# Patient Record
Sex: Female | Born: 1950 | Race: Black or African American | Hispanic: No | Marital: Married | Smoking: Light tobacco smoker
Health system: Southern US, Community
[De-identification: ages and names within clinical notes are randomized; demographics above are authoritative.]

## PROBLEM LIST (undated history)

## (undated) DIAGNOSIS — I447 Left bundle-branch block, unspecified: Secondary | ICD-10-CM

## (undated) DIAGNOSIS — I420 Dilated cardiomyopathy: Secondary | ICD-10-CM

## (undated) DIAGNOSIS — Z91199 Patient's noncompliance with other medical treatment and regimen due to unspecified reason: Secondary | ICD-10-CM

## (undated) DIAGNOSIS — R1013 Epigastric pain: Secondary | ICD-10-CM

## (undated) DIAGNOSIS — Z9119 Patient's noncompliance with other medical treatment and regimen: Secondary | ICD-10-CM

## (undated) DIAGNOSIS — I5022 Chronic systolic (congestive) heart failure: Secondary | ICD-10-CM

## (undated) DIAGNOSIS — I428 Other cardiomyopathies: Secondary | ICD-10-CM

## (undated) DIAGNOSIS — K219 Gastro-esophageal reflux disease without esophagitis: Secondary | ICD-10-CM

## (undated) DIAGNOSIS — N183 Chronic kidney disease, stage 3 unspecified: Secondary | ICD-10-CM

## (undated) DIAGNOSIS — R739 Hyperglycemia, unspecified: Secondary | ICD-10-CM

## (undated) DIAGNOSIS — R911 Solitary pulmonary nodule: Secondary | ICD-10-CM

## (undated) DIAGNOSIS — J440 Chronic obstructive pulmonary disease with acute lower respiratory infection: Secondary | ICD-10-CM

## (undated) DIAGNOSIS — E43 Unspecified severe protein-calorie malnutrition: Secondary | ICD-10-CM

## (undated) DIAGNOSIS — N289 Disorder of kidney and ureter, unspecified: Secondary | ICD-10-CM

## (undated) DIAGNOSIS — J209 Acute bronchitis, unspecified: Secondary | ICD-10-CM

## (undated) DIAGNOSIS — R740 Nonspecific elevation of levels of transaminase and lactic acid dehydrogenase [LDH]: Secondary | ICD-10-CM

## (undated) DIAGNOSIS — I340 Nonrheumatic mitral (valve) insufficiency: Secondary | ICD-10-CM

## (undated) DIAGNOSIS — R0603 Acute respiratory distress: Secondary | ICD-10-CM

## (undated) DIAGNOSIS — R0600 Dyspnea, unspecified: Secondary | ICD-10-CM

## (undated) DIAGNOSIS — J9601 Acute respiratory failure with hypoxia: Secondary | ICD-10-CM

## (undated) DIAGNOSIS — I5023 Acute on chronic systolic (congestive) heart failure: Secondary | ICD-10-CM

## (undated) DIAGNOSIS — Z720 Tobacco use: Secondary | ICD-10-CM

## (undated) HISTORY — DX: Dilated cardiomyopathy: I42.0

## (undated) HISTORY — DX: Disorder of kidney and ureter, unspecified: N28.9

## (undated) HISTORY — DX: Hyperglycemia, unspecified: R73.9

## (undated) HISTORY — PX: HERNIA REPAIR: SHX51

## (undated) HISTORY — DX: Acute on chronic systolic (congestive) heart failure: I50.23

## (undated) HISTORY — DX: Nonspecific elevation of levels of transaminase and lactic acid dehydrogenase (ldh): R74.0

## (undated) HISTORY — DX: Acute bronchitis, unspecified: J20.9

## (undated) HISTORY — DX: Nonrheumatic mitral (valve) insufficiency: I34.0

## (undated) HISTORY — PX: CARDIAC CATHETERIZATION: SHX172

## (undated) HISTORY — DX: Chronic obstructive pulmonary disease with (acute) lower respiratory infection: J44.0

## (undated) HISTORY — DX: Epigastric pain: R10.13

## (undated) HISTORY — DX: Acute respiratory failure with hypoxia: J96.01

## (undated) HISTORY — DX: Unspecified severe protein-calorie malnutrition: E43

## (undated) HISTORY — DX: Acute respiratory distress: R06.03

---

## 2004-09-02 ENCOUNTER — Other Ambulatory Visit: Payer: Self-pay

## 2004-09-03 ENCOUNTER — Inpatient Hospital Stay: Payer: Self-pay | Admitting: General Surgery

## 2009-01-24 ENCOUNTER — Emergency Department: Payer: Self-pay | Admitting: Emergency Medicine

## 2010-10-31 ENCOUNTER — Emergency Department: Payer: Self-pay | Admitting: *Deleted

## 2013-09-30 ENCOUNTER — Inpatient Hospital Stay: Payer: Self-pay | Admitting: Internal Medicine

## 2013-09-30 DIAGNOSIS — I059 Rheumatic mitral valve disease, unspecified: Secondary | ICD-10-CM

## 2013-09-30 LAB — COMPREHENSIVE METABOLIC PANEL
ALBUMIN: 3.1 g/dL — AB (ref 3.4–5.0)
ANION GAP: 8 (ref 7–16)
AST: 50 U/L — AB (ref 15–37)
Alkaline Phosphatase: 153 U/L — ABNORMAL HIGH
BILIRUBIN TOTAL: 1 mg/dL (ref 0.2–1.0)
BUN: 17 mg/dL (ref 7–18)
CALCIUM: 9.2 mg/dL (ref 8.5–10.1)
CHLORIDE: 106 mmol/L (ref 98–107)
CO2: 24 mmol/L (ref 21–32)
Creatinine: 1.24 mg/dL (ref 0.60–1.30)
EGFR (African American): 54 — ABNORMAL LOW
EGFR (Non-African Amer.): 47 — ABNORMAL LOW
Glucose: 92 mg/dL (ref 65–99)
OSMOLALITY: 277 (ref 275–301)
Potassium: 4 mmol/L (ref 3.5–5.1)
SGPT (ALT): 53 U/L
Sodium: 138 mmol/L (ref 136–145)
Total Protein: 8 g/dL (ref 6.4–8.2)

## 2013-09-30 LAB — URINALYSIS, COMPLETE
BACTERIA: NONE SEEN
Bilirubin,UR: NEGATIVE
Glucose,UR: NEGATIVE mg/dL (ref 0–75)
Ketone: NEGATIVE
Leukocyte Esterase: NEGATIVE
NITRITE: NEGATIVE
PROTEIN: NEGATIVE
Ph: 5 (ref 4.5–8.0)
Specific Gravity: 1.006 (ref 1.003–1.030)
Squamous Epithelial: <1
WBC UR: 1 /HPF (ref 0–5)

## 2013-09-30 LAB — PRO B NATRIURETIC PEPTIDE: B-Type Natriuretic Peptide: 7302 pg/mL — ABNORMAL HIGH (ref 0–125)

## 2013-09-30 LAB — CBC
HCT: 36.3 % (ref 35.0–47.0)
HGB: 11.5 g/dL — AB (ref 12.0–16.0)
MCH: 28.8 pg (ref 26.0–34.0)
MCHC: 31.8 g/dL — ABNORMAL LOW (ref 32.0–36.0)
MCV: 91 fL (ref 80–100)
Platelet: 448 10*3/uL — ABNORMAL HIGH (ref 150–440)
RBC: 4.01 10*6/uL (ref 3.80–5.20)
RDW: 14.9 % — ABNORMAL HIGH (ref 11.5–14.5)
WBC: 6.7 10*3/uL (ref 3.6–11.0)

## 2013-09-30 LAB — TROPONIN I
Troponin-I: <0.02 ng/mL
Troponin-I: <0.02 ng/mL

## 2013-09-30 LAB — MAGNESIUM: Magnesium: 1.8 mg/dL

## 2013-09-30 LAB — LIPASE, BLOOD: Lipase: 129 U/L (ref 73–393)

## 2013-10-01 DIAGNOSIS — I5023 Acute on chronic systolic (congestive) heart failure: Secondary | ICD-10-CM

## 2013-10-01 DIAGNOSIS — I10 Essential (primary) hypertension: Secondary | ICD-10-CM

## 2013-10-01 LAB — BASIC METABOLIC PANEL
Anion Gap: 7 (ref 7–16)
BUN: 20 mg/dL — ABNORMAL HIGH (ref 7–18)
CALCIUM: 9.1 mg/dL (ref 8.5–10.1)
Chloride: 100 mmol/L (ref 98–107)
Co2: 30 mmol/L (ref 21–32)
Creatinine: 1.12 mg/dL (ref 0.60–1.30)
EGFR (Non-African Amer.): 53 — ABNORMAL LOW
Glucose: 91 mg/dL (ref 65–99)
Osmolality: 276 (ref 275–301)
Potassium: 3.7 mmol/L (ref 3.5–5.1)
SODIUM: 137 mmol/L (ref 136–145)

## 2013-10-02 LAB — BASIC METABOLIC PANEL
Anion Gap: 8 (ref 7–16)
BUN: 19 mg/dL — AB (ref 7–18)
CO2: 27 mmol/L (ref 21–32)
Calcium, Total: 8.8 mg/dL (ref 8.5–10.1)
Chloride: 102 mmol/L (ref 98–107)
Creatinine: 1.08 mg/dL (ref 0.60–1.30)
EGFR (Non-African Amer.): 55 — ABNORMAL LOW
GLUCOSE: 84 mg/dL (ref 65–99)
Osmolality: 275 (ref 275–301)
POTASSIUM: 4 mmol/L (ref 3.5–5.1)
Sodium: 137 mmol/L (ref 136–145)

## 2014-01-13 ENCOUNTER — Emergency Department: Payer: Self-pay | Admitting: Emergency Medicine

## 2014-01-13 LAB — BASIC METABOLIC PANEL
Anion Gap: 10 (ref 7–16)
BUN: 16 mg/dL (ref 7–18)
CALCIUM: 9.1 mg/dL (ref 8.5–10.1)
CHLORIDE: 107 mmol/L (ref 98–107)
CREATININE: 1.18 mg/dL (ref 0.60–1.30)
Co2: 26 mmol/L (ref 21–32)
EGFR (African American): 60 — ABNORMAL LOW
EGFR (Non-African Amer.): 49 — ABNORMAL LOW
Glucose: 94 mg/dL (ref 65–99)
OSMOLALITY: 286 (ref 275–301)
Potassium: 3.9 mmol/L (ref 3.5–5.1)
Sodium: 143 mmol/L (ref 136–145)

## 2014-01-13 LAB — CBC
HCT: 38.3 % (ref 35.0–47.0)
HGB: 12.3 g/dL (ref 12.0–16.0)
MCH: 29.7 pg (ref 26.0–34.0)
MCHC: 32.1 g/dL (ref 32.0–36.0)
MCV: 92 fL (ref 80–100)
PLATELETS: 378 10*3/uL (ref 150–440)
RBC: 4.14 10*6/uL (ref 3.80–5.20)
RDW: 16.8 % — ABNORMAL HIGH (ref 11.5–14.5)
WBC: 7.1 10*3/uL (ref 3.6–11.0)

## 2014-01-13 LAB — MAGNESIUM: Magnesium: 2 mg/dL

## 2014-01-13 LAB — TROPONIN I: Troponin-I: <0.02 ng/mL

## 2014-03-29 ENCOUNTER — Emergency Department: Payer: Self-pay | Admitting: Emergency Medicine

## 2014-03-29 LAB — BASIC METABOLIC PANEL
Anion Gap: 6 — ABNORMAL LOW (ref 7–16)
BUN: 13 mg/dL (ref 7–18)
Calcium, Total: 9.5 mg/dL (ref 8.5–10.1)
Chloride: 105 mmol/L (ref 98–107)
Co2: 28 mmol/L (ref 21–32)
Creatinine: 1.13 mg/dL (ref 0.60–1.30)
EGFR (African American): >60
EGFR (Non-African Amer.): 52 — ABNORMAL LOW
Glucose: 87 mg/dL (ref 65–99)
OSMOLALITY: 277 (ref 275–301)
Potassium: 3.9 mmol/L (ref 3.5–5.1)
Sodium: 139 mmol/L (ref 136–145)

## 2014-03-29 LAB — PRO B NATRIURETIC PEPTIDE: B-Type Natriuretic Peptide: 5106 pg/mL — ABNORMAL HIGH (ref 0–125)

## 2014-03-29 LAB — CBC
HCT: 36.8 % (ref 35.0–47.0)
HGB: 11.9 g/dL — AB (ref 12.0–16.0)
MCH: 28.8 pg (ref 26.0–34.0)
MCHC: 32.2 g/dL (ref 32.0–36.0)
MCV: 90 fL (ref 80–100)
Platelet: 430 10*3/uL (ref 150–440)
RBC: 4.12 10*6/uL (ref 3.80–5.20)
RDW: 16 % — ABNORMAL HIGH (ref 11.5–14.5)
WBC: 8.5 10*3/uL (ref 3.6–11.0)

## 2014-03-29 LAB — TROPONIN I: Troponin-I: <0.02 ng/mL

## 2014-05-16 ENCOUNTER — Emergency Department: Payer: Self-pay | Admitting: Student

## 2014-07-02 NOTE — Discharge Summary (Signed)
PATIENT NAME:  Barbara Oliver, Barbara Oliver MR#:  [id number] DATE OF BIRTH:  [birth year]  DATE OF ADMISSION:  09/30/2013 DATE OF DISCHARGE:  10/03/2013  PRIMARY CARE PHYSICIAN: [facility].   FINAL DIAGNOSES:  1.  Acute respiratory failure, which resolved.  2.  Acute systolic congestive heart failure with severe cardiomyopathy and moderate to severe mitral regurgitation.  3.  Hypertension.  4.  Tobacco abuse.  5.  Hypokalemia.   MEDICATIONS ON DISCHARGE: Include enalapril 5 mg 1 tablet daily, aspirin 81 mg daily, Coreg 3.125 mg twice a day, furosemide 40 mg daily, Klor-Con 10 mEq daily.   DIET: Low-sodium diet, regular consistency.   ACTIVITY: As tolerated.  FOLLOWUP: With Dr. Mariah Milling, cardiology, 1 week; CHF clinic 1-2 weeks with the [facility].   HOSPITAL COURSE: The patient was admitted 09/30/2013, discharged 10/03/2013, came in with shortness of breath and epigastric pain, was admitted with acute respiratory failure and congestive heart failure, was given a stat dose of Lasix and put on 2 L nasal cannula.  LABORATORY AND RADIOLOGICAL DATA DURING THE HOSPITAL COURSE: Included a BNP of 7302. Urinalysis positive for 1+ blood. Troponin negative. Magnesium 1.8. Lipase 129. White blood cell count 6.7, hemoglobin and hematocrit 11.5 and 36.8, platelet count 448,000. Glucose 92, BUN 17, creatinine 1.24, sodium 138, potassium 4.0, chloride 106, CO2 of 24, calcium 9.2, total bilirubin 1.0, alkaline phosphatase 153, ALT 53, AST 50, albumin 3.1. Chest x-ray showed CHF. Abdominal x-ray: Nonobstructive bowel gas pattern. Two troponins negative. Echocardiogram showed an EF of 20% to 25%, severely decreased global left ventricular systolic function, moderate to severe mitral valve regurgitation, moderate to severely elevated pulmonary arterial systolic pressure. Creatinine upon discharge 1.08, potassium 4.0.  HOSPITAL COURSE PER PROBLEM LIST:  1.  For the patient's acute respiratory failure, this had  resolved. The patient was off oxygen, breathing room air comfortably with good saturations upon discharge.  2.  Acute systolic congestive heart failure with severe cardiomyopathy and moderate to severe mitral regurgitation. Patient was diuresed with IV Lasix 40 mg IV b.i.d. during the entire hospital course, switched to Lasix orally 40 mg daily. For severe cardiomyopathy, we did start Coreg 3.125 mg twice a day and enalapril 5 mg daily. The patient will be sent home on 40 mg orally of Lasix, also. Blood pressure upon discharge 102/68. We do like blood pressure on the lower side. As outpatient, may want to cut back Lasix to 20 mg daily as you titrate up the ACE inhibitor. I do recommend a BMP as outpatient.  3.  Hypertension. Patient has relative hypotension here.  4.  Tobacco abuse. Smoking cessation done during the hospital admission.  5.  Hypokalemia. This was replaced during the hospital course. Do recommend checking a BMP in followup appointment.  TIME SPENT ON DISCHARGE: 35 minutes.    ____________________________ Herschell Dimes. Renae Gloss, MD rjw:sk D: 10/03/2013 14:15:54 ET T: 10/04/2013 00:59:57 ET JOB#: [id number]  cc: Herschell Dimes. Renae Gloss, MD, <Dictator> [facility] Antonieta Iba, MD  Salley Scarlet MD ELECTRONICALLY SIGNED 10/05/2013 16:19

## 2014-07-02 NOTE — H&P (Signed)
PATIENT NAME:  Barbara Oliver, Barbara Oliver MR#:  [id number] DATE OF BIRTH:  03-17-1950  DATE OF ADMISSION:  09/30/2013  PRIMARY CARE PHYSICIAN: [facility].   CHIEF COMPLAINT: Shortness of breath and epigastric pain.   HISTORY OF PRESENT ILLNESS: A 64 year old [name] patient with a prior history of repair of incarcerated ventral hernia with chronic on and off epigastric pain, presented to the Emergency Room with a flare-up of her pain along with shortness of breath and orthopnea that she has noticed over the past 3 to 4 days. She also has significant exertional shortness of breath. Does not complain of any chest pain. This epigastric pain that she has is very similar to the flare-ups she has been having for years.   Here in the Emergency Room, the patient has desaturated to 88% to 89% on room air, along with crackles on examination. Chest x-ray showing congestive heart failure, and is being admitted to the hospitalist service. Also her EKG shows left bundle branch block, which is a new finding compared to EKG on file from 2012.   She has  sputum with cough, orthopnea. No lower extremity edema. Has not had any chest pain. No prior cardiac work-up.   PAST MEDICAL HISTORY:  1. Ventral hernia with repair in 2006.  2. Carpal tunnel release.  3. On and off chronic epigastric pain.   SOCIAL HISTORY: The patient smokes 1 or 2 cigarettes a day. No alcohol. No illicit drug use.   CODE STATUS: Full Code.   FAMILY HISTORY: Mother had hypertension. Father died of an MI at a very old age.   HOME MEDICATIONS: Claritin p.r.n.   ALLERGIES: No known drug allergies.   REVIEW OF SYSTEMS:   CONSTITUTIONAL: Complains of fatigue and weakness.  RESPIRATORY: Has cough, clear sputum.  CARDIOVASCULAR: No chest pain, has orthopnea. GASTROINTESTINAL: No nausea, vomiting or diarrhea. Does have abdominal pain.  GENITOURINARY: No dysuria, hematuria, or frequency.  ENDOCRINE: No polyuria, nocturia, or  thyroid problems.  HEMATOLOGIC AND LYMPHATIC: No anemia, easy bruising or bleeding, INTEGUMENTARY: No ulcres, rashMUSCULOSKELETAL: No back pain or arthritis.  NEUROLOGIC: No focal numbness, weakness, or seizure.  PSYCHIATRIC: No anxiety or depression.   PHYSICAL EXAMINATION:  VITAL SIGNS: Temperature 97.8, pulse of 97, respirations 18, blood pressure 131/92, saturating 96% on room air.  GENERAL: Moderately built [name] patient sitting up in bed with conversational dyspnea of 1 line.  GENERAL: Alert and oriented x 3, anxious.  HEENT: Atraumatic, normocephalic. Mucous membranes are moist and pink. External ears normal. No pallor. No icterus. Pupils bilaterally equal and reactive to light.  NECK: Supple. No thyromegaly. No palpable lymph nodes. Trachea midline. No carotid bruit or JVD.  CARDIOVASCULAR: S1, S2, without any murmurs  2+, no edema.  RESPIRATORY: Increased work of breathing with bilateral crackles. Good air entry. No wheezing.  GASTROINTESTINAL: Soft abdomen. Tenderness in the epigastric area. No  rigidity, guarding. Bowel sounds present. No hepatosplenomegaly palpable.  SKIN: Warm and dry. No petechiae or ulcers.  MUSCULOSKELETAL: No joint swelling , redness of large joints. Normal muscle tone.  NEUROLOGICAL: Motor strength 5/5 in upper and lower extremities. Sensation is intact.  LYMPHATIC: No cervical lymphadenopathy.   LABORATORY STUDIES: Show glucose of 92, BNP of 7300, BUN 17, creatinine 1.24, sodium 138, potassium 4, with GFR 47. AST, ALT, alkaline phosphatase, bilirubin normal. WBC 6.7, hemoglobin 11.5, platelets of 448,000.   Urinalysis shows no bacteria.   Chest x-ray shows congestive heart failure.   EKG shows left bundle  branch block, which is new compared to 2012, and this was normal sinus rhythm with no bundle branch block.   ASSESSMENT AND PLAN:  1. Acute respiratory failure secondary to acute congestive heart failure. Unknown if diastolic,  systolic. We will check an, echocardiogram. We will give stat dose of Lasix IV 40 mg. We will also put her on 2 liters nasal cannula to keep saturations over 92%. Monitor input and output, fluid restriction, low-salt diet. We will consult cardiology as with her changes on the EKG, the patient will need an outpatient stress test to follow up. Since troponin is normal and she does not have any chest pain, I suspect this change in EKG has happened in the past. I do not think this is acute.  2. Elevated blood pressure without diagnosis of hypertension, likely has underlying hypertension. We will put her on a low-dose beta blocker at this time.  3. Tobacco abuse. Counseled the patient to quit smoking for greater than 3 minutes. She does understand, but the patient says she smokes very little, but will try to quit.  4. Deep venous thrombosis prophylaxis with Lovenox.  5. CODE STATUS: Full Code.   Management of this case was 45 minutes.   ____________________________ Molinda Bailiff Adreana Coull, MD srs:jr D: 09/30/2013 13:37:11 ET T: 09/30/2013 14:34:50 ET JOB#: [id number]  cc: Wardell Heath R. Maleeha Halls, MD, <Dictator> [facility] Orie Fisherman MD ELECTRONICALLY SIGNED 10/25/2013 14:09

## 2014-07-02 NOTE — Consult Note (Signed)
General Aspect 64 y/o F with h/o incarcerated ventral hernia s/p repair 2006, siatica, and carpal tunnel who presented to [facility] 09/30/2013 with 1 year h/o SOB/DOE that had significantly worsened over the past 3-4 days prior to presenting to care.  ______________________   Present Illness 64 y/o F with the above problem list who presented to [facility] on 09/30/2013 with a 1 year h/o SOB/DOE that had significantly worsened over the past 3-4 days prior to presenting to care. She denies any prior cardiac history.   Over the past 1 year she notes having to stop walking at times to the corner 2/2 to SOB/DOE. This route is apprx 50-60 feet. At times she has noted substernal/epigastric pain during this exertion that is releaved with rest. She has also noted this pain with rest as well. Over the past 3-4 days prior to presenting to care her SOB/DOE significantly worsened, to the point that she could not walk to her bathroom without stopping to take a break. She denies any chest pain or epigastric pain during this time. It was at this time she decided to come in for evaluation. She notes significant orthopnea with her head elevated at about 40 degrees at home. Even with this there are times that she has had difficulty breathing. She notes early satiety when she previously was able to eat quite a bit. She notes mild lower extremity edema. Denies any abdminal swelling. Mild cough with clear sputum.   She goes to [facility] for her routine outpatient care and gets a CPE there yearly.   Upon entering the room she is now on room air and states she is breathing comfortably. Troponins are negative x 3. Echo on 09/30/2013: EF 20-25%, unable to exclude regional WMA, nl RV size and systolic function, mildly increased LV internal cavity size, moderate-severe mitral regurgitation, mild tricuspid regurgitation, moderate-severe increased PASP at 60.1 mm HG (no prior to compare.)   Abdominal film with Moderate cardiomegaly. Vascular  congestion. Diffuse interstitial edema. No pleural effusion.No free ntraperitoneal gas. No disproportionate dilatation of bowel. Probable phleboliths project over the pelvis. Osteopenia.  Home medications: Not currently on medications   Physical Exam:  GEN well developed, well nourished, no acute distress, pleasant   HEENT hearing intact to voice, moist oral mucosa   NECK supple   RESP normal resp effort  crackles   CARD Regular rate and rhythm  S3  Murmur   Murmur Systolic  2/6   Systolic Murmur Out flow    ABD denies tenderness  soft  normal BS   LYMPH negative neck   EXTR negative edema   SKIN normal to palpation   NEURO motor/sensory function intact   PSYCH alert, A+O to time, place, person, good insight   Review of Systems:  Subjective/Chief Complaint SOB, severe cough, worse when supine   General: Weight loss or gain  weight loss   Skin: No Complaints   ENT: No Complaints   Eyes: No Complaints   Neck: No Complaints   Respiratory: Frequent cough  Short of breath  DOE   Cardiovascular: Chest pain or discomfort  Dyspnea  Orthopnea   Gastrointestinal: No Complaints   Vascular: No Complaints   Musculoskeletal: No Complaints   Neurologic: No Complaints   Hematologic: No Complaints   Endocrine: Excessive thirst or urination  nocturia   Psychiatric: No Complaints   Review of Systems: All other systems were reviewed and found to be negative   Medications/Allergies Reviewed Medications/Allergies reviewed   Family &  Social History:  Family and Social History:  Family History Coronary Artery Disease  Hypertension  Smoking  father passed from MI at age 90. mother with htn and passed 2/2 complications from hip fx   Social History positive  tobacco, negative ETOH, negative Illicit drugs, 3 cigs per day for 30 years   + Tobacco Current (within 1 year)  1-2 cigs/day   Place of Living Home  lives in [place]     Hernia:    Carpal Tunnel  Release:    Hernia Repair:          Admit Diagnosis:   CHF: Onset Date: 01-Oct-2013, Status: Active, Description: CHF  Lab Results:  LabObservation:  23-Jul-15 17:54   OBSERVATION Reason for Test  Routine Chem:  24-Jul-15 04:08   Glucose, Serum 91  BUN  20  Creatinine (comp) 1.12  Sodium, Serum 137  Potassium, Serum 3.7  Chloride, Serum 100  CO2, Serum 30  Calcium (Total), Serum 9.1  Anion Gap 7  Osmolality (calc) 276  eGFR (African American) >60  eGFR (Non-African American)  53 (eGFR values <32m/min/1.73 m2 may be an indication of chronic kidney disease (CKD). Calculated eGFR is useful in patients with stable renal function. The eGFR calculation will not be reliable in acutely ill patients when serum creatinine is changing rapidly. It is not useful in  patients on dialysis. The eGFR calculation may not be applicable to patients at the low and high extremes of body sizes, pregnant women, and vegetarians.)  Cardiac:  23-Jul-15 10:21   Troponin I < 0.02 (0.00-0.05 0.05 ng/mL or less: NEGATIVE  Repeat testing in 3-6 hrs  if clinically indicated. >0.05 ng/mL: POTENTIAL  MYOCARDIAL INJURY. Repeat  testing in 3-6 hrs if  clinically indicated. NOTE: An increase or decrease  of 30% or more on serial  testing suggests a  clinically important change)    14:26   Troponin I < 0.02 (0.00-0.05 0.05 ng/mL or less: NEGATIVE  Repeat testing in 3-6 hrs  if clinically indicated. >0.05 ng/mL: POTENTIAL  MYOCARDIAL INJURY. Repeat  testing in 3-6 hrs if  clinically indicated. NOTE: An increase or decrease  of 30% or more on serial  testing suggests a  clinically important change)    19:01   Troponin I < 0.02 (0.00-0.05 0.05 ng/mL or less: NEGATIVE  Repeat testing in 3-6 hrs  if clinically indicated. >0.05 ng/mL: POTENTIAL  MYOCARDIAL INJURY. Repeat  testing in 3-6 hrs if  clinically indicated. NOTE: An increase or decrease  of 30% or more on serial  testing suggests  a  clinically important change)   EKG:  EKG Interp. by me   Interpretation EKG shows NSR, 91, LBBB, LAD   Radiology Results: XRay:    23-Jul-15 10:57, Abdomen 3 Way Includes PA Chest  Abdomen 3 Way Includes PA Chest   REASON FOR EXAM:    chest and upper abdominal pain, reportedly has hx of   abd hernia repair  COMMENTS:       PROCEDURE: DXR - DXR ABDOMEN 3-WAY (INCL PA CXR)  - Sep 30 2013 10:57AM     CLINICAL DATA:  Chest and abdominal pain    EXAM:  ABDOMEN SERIES    COMPARISON:  10/31/2010    FINDINGS:  Moderate cardiomegaly. Vascular congestion. Diffuse interstitial  edema. No pleural effusion.    No free intraperitoneal gas. No disproportionate dilatation of  bowel. Probable phleboliths project over the pelvis. Osteopenia.     IMPRESSION:  CHF.  Nonobstructive bowel gas pattern.      Electronically Signed    By: Maryclare Bean M.D.    On: 09/30/2013 11:11     Verified By: Jamas Lav, M.D.,  Cardiology:    23-Jul-15 17:54, Echo Doppler  Echo Doppler   REASON FOR EXAM:      COMMENTS:       PROCEDURE: [facility] - ECHO DOPPLER COMPLETE(TRANSTHOR)  - Sep 30 2013  5:54PM     RESULT: Echocardiogram Report    Patient Name:   Barbara Oliver Date of Exam: 09/30/2013  Medical Rec #:  [id number]        Custom1:  Date ofBirth:  04/19/1950    Height:       73.0 in  Patient Age:    11 years      Weight:       164.0 lb  Patient Gender: F             BSA:          1.98 m??    Indications: CHF  Sonographer:    Arville Go RDCS  Referring Phys: Hillary Bow, R    Summary:   1. Left ventricular ejection fraction, by visual estimation, is 20 to   25%.   2. Severely decreased global left ventricular systolic function.   3. Unable to exclude regional wall motion abnormality.   4. Normal right ventricular size and systolic function.   5. Mildly increased left ventricular internal cavity size.   6. Moderate to severe mitral valve regurgitation.   7. Mild tricuspid  regurgitation.   8. Moderate to severely elevated pulmonary artery systolic pressure.  2D AND M-MODE MEASUREMENTS (normal ranges within parentheses):  Left Ventricle:          Normal  IVSd (2D):      0.82 cm (0.7-1.1)  LVPWd (2D):     1.12 cm (0.7-1.1) Aorta/LA:                  Normal  LVIDd (2D):     5.35 cm (3.4-5.7) Aortic Root (2D): 3.20 cm (2.4-3.7)  LVIDs (2D):     4.72 cm           Left Atrium (2D): 4.00 cm (1.9-4.0)  LV FS (2D):     11.8 %   (>25%)  LV EF (2D):     25.3 %   (>50%)                                    Right Ventricle:                                    RVd (2D):  LV DIASTOLIC FUNCTION:  MV Peak E: 1.23 m/s  SPECTRAL DOPPLER ANALYSIS (where applicable):  Aortic Valve: AoV Max Vel: 1.18 m/s AoV Peak PG: 5.6 mmHg AoV Mean PG:  LVOT Vmax:  LVOT VTI:  LVOT Diameter: 2.40 cm  Aortic Insufficiency:  AI Half-time:  627 msec  AI Decel Rate: 1.92 m/s??  Tricuspid Valve and PA/RV Systolic Pressure: TR Max Velocity: 3.54 m/s RA   Pressure: 10 mmHg RVSP/PASP: 60.1 mmHg  Pulmonic Valve:  PV Max Velocity: 1.02 m/s PV Max PG: 4.2 mmHg PV Mean PG:    PHYSICIAN INTERPRETATION:  Left Ventricle: The left ventricular internal cavity size was mildly  increased. No left ventricular hypertrophy. Global LV systolic function   was severely decreased. Left ventricular ejection fraction, by visual   estimation, is 20 to 25%.  Right Ventricle: Normal right ventricular size, wall thickness, and   systolic function. The right ventricular size is normal. Global RV   systolic function is normal.  Left Atrium: The left atrium is normal in size.  Right Atrium: The right atrium is normal in size.  Pericardium: There is no evidence of pericardial effusion.  Mitral Valve: The mitral valve is normal in structure. Moderate to severe   mitral valve regurgitation is seen.  Tricuspid Valve: The tricuspid valve is normal. Mild tricuspid   regurgitation is visualized. The tricuspid regurgitant  velocity is 3.54   m/s, and with an assumed right atrial pressure of 10 mmHg, the estimated   right ventricular systolic pressure is moderately elevated at 60.1 mmHg.  Aortic Valve: The aortic valve is normal. Mild aortic valve sclerosis is   present, with no evidence of aortic valve stenosis. Trivial aortic valve   regurgitation is seen.  Pulmonic Valve: The pulmonic valve is normal. Trace pulmonic valve   regurgitation.  Aorta: The aortic root and ascending aorta are structurally normal, with   no evidence of dilitation.  [id number] Ida Rogue MD  Electronically signed by [id number] Ida Rogue MD  Signature Date/Time: 10/01/2013/7:41:26 AM    *** Final ***    IMPRESSION: .        Verified By: Minna Merritts, M.D., MD    No Known Allergies:   Vital Signs/Nurse's Notes:  **Vital Signs.:   24-Jul-15 04:54  Vital Signs Type Admission  Temperature Temperature (F) 98  Celsius 36.6  Temperature Source oral  Pulse Pulse 83  Respirations Respirations 20  Systolic BP Systolic BP 063  Diastolic BP (mmHg) Diastolic BP (mmHg) 86  Mean BP 99  Pulse Ox % Pulse Ox % 99  Pulse Ox Activity Level  At rest  Oxygen Delivery 2L  *Intake and Output.:   Daily 24-Jul-15 07:00  Grand Totals Intake:  240 Output:  1000    Net:  -75 24 Hr.:  -760  Oral Intake      In:  240  Urine ml     Out:  1000  Length of Stay Totals Intake:  240 Output:  1000    Net:  -65    Impression 64 y/o F with h/o incarcerated ventral hernia s/p repair 2006, siatica, and carpal tunnel who presented to [facility] 09/30/2013 with 1 year h/o SOB/DOE that had significantly worsened over the past 3-4 days prior to presenting to care.   1) Acute on chronic systolic CHF:  Her symptoms have present for around 1 year now, with recent exacerbation over the past 3-4 days prior to admission. Echo with reduced EF of 20-25% and increased PASP at 60.1 mm Hg. She has responded well to lasix 40 mg bid, now off oxygen (previously on  2 liters via [place].)  ---Would add enalapril 5 mg daily (goal BP around 100.)  ---Continue coreg 3.125 mg bid at this time.  ---Continue aspirin 81 mg daily.  ---Can consider spiro at outpatient follow up.  She needs nuclear stress test, I do not think she will tolerate ETT given her breathing status. She will likely tolerate this better as an outpatient as she continues to have a severe cough.  Will schedule as an outpt.  She does continue to have crackles on exam and likely requires another day of  hospital admission for agressive diuresis  2) Hypokalemia:  Currently 3.7 Will add k-dur 20 meq daily.   3) Hypertension:  Controlled currently.  Will add enalapril 5 mg daily. Continue coreg 3.125 bid. Goal BP around 100.   4) Tobacco abuse:  Discussed tobacco cessation.   Electronic Signatures: Rise Mu (PA-C)  (Signed 24-Jul-15 10:20)  Authored: General Aspect/Present Illness, History and Physical Exam, Review of System, Family & Social History, Past Medical History, Home Medications, Labs, EKG , Radiology, Allergies, Vital Signs/Nurse's Notes, Impression/Plan Ida Rogue (MD)  (Signed 24-Jul-15 12:56)  Authored: General Aspect/Present Illness, History and Physical Exam, Review of System, Family & Social History, Health Issues, Labs, EKG , Vital Signs/Nurse's Notes, Impression/Plan  Co-Signer: General Aspect/Present Illness, History and Physical Exam, Review of System, Family & Social History, Past Medical History, Home Medications, Labs, EKG , Radiology, Allergies, Vital Signs/Nurse's Notes, Impression/Plan   Last Updated: 24-Jul-15 12:56 by Ida Rogue (MD)

## 2014-09-07 ENCOUNTER — Encounter: Payer: Self-pay | Admitting: *Deleted

## 2014-09-07 ENCOUNTER — Emergency Department
Admission: EM | Admit: 2014-09-07 | Discharge: 2014-09-07 | Disposition: A | Discharge status: Home / Self Care | Payer: Self-pay | Attending: Emergency Medicine | Admitting: Emergency Medicine

## 2014-09-07 DIAGNOSIS — Z720 Tobacco use: Secondary | ICD-10-CM | POA: Insufficient documentation

## 2014-09-07 DIAGNOSIS — I5022 Chronic systolic (congestive) heart failure: Secondary | ICD-10-CM | POA: Insufficient documentation

## 2014-09-07 MED ORDER — FUROSEMIDE 20 MG PO TABS: 20.0000 mg | ORAL_TABLET | Freq: Every day | ORAL | Status: DC

## 2014-09-07 MED ORDER — CARVEDILOL 3.125 MG PO TABS
3.1250 mg | ORAL_TABLET | Freq: Once | ORAL | Status: DC
  Filled 2014-09-07 (×2): qty 1

## 2014-09-07 MED ORDER — FUROSEMIDE 40 MG PO TABS
20.0000 mg | ORAL_TABLET | Freq: Once | ORAL | Status: DC
  Filled 2014-09-07: qty 0.5

## 2014-09-07 MED ORDER — CARVEDILOL 3.125 MG PO TABS: 3.1250 mg | ORAL_TABLET | Freq: Two times a day (BID) | ORAL | Status: DC

## 2014-09-07 NOTE — ED Notes (Signed)
Pt does not have a PCP is here for refill for lasix and Coreg, pt denies any new symptoms

## 2014-09-07 NOTE — ED Provider Notes (Signed)
[facility] Emergency Department Provider Note  ____________________________________________  Time seen: Approximately 11:52 AM  I have reviewed the triage vital signs and the nursing notes.   HISTORY  Chief Complaint Medication Refill    HPI Barbara Oliver is a 64 y.o. female with a hx of heart failure presenting for refills of her medications. She states she has been out of her Coreg and Lasix for one week and is now having dyspnea on exertion, orthopnea, and a productive cough with clear sputum. Compliant with medications, and denies any issues while on them. Checks BP monthly at [facility], states readings WNL (120's-130's/80's). States she has been trying to get in with [facility] for months but keeps getting rejected, still working on getting a PCP. No recent illness, CP, or pedal edema.    Past Medical History  Diagnosis Date  . CHF (congestive heart failure)     There are no active problems to display for this patient.   History reviewed. No pertinent past surgical history.  Current Outpatient Rx  Name  Route  Sig  Dispense  Refill  . carvedilol (COREG) 3.125 MG tablet   Oral   Take 1 tablet (3.125 mg total) by mouth 2 (two) times daily.   60 tablet   2   . furosemide (LASIX) 20 MG tablet   Oral   Take 1 tablet (20 mg total) by mouth daily.   30 tablet   2     Allergies Review of patient's allergies indicates not on file.  No family history on file.  Social History History  Substance Use Topics  . Smoking status: Current Every Day Smoker -- 0.50 packs/day  . Smokeless tobacco: Not on file  . Alcohol Use: No    Review of Systems Constitutional: No fever/chills Eyes: No visual changes. ENT: No sore throat. Cardiovascular: Denies chest pain.  Respiratory: Positive for DOE and 2 pillow orthopnea. Gastrointestinal: No abdominal pain.  No nausea, no vomiting.  No diarrhea.  No constipation. Genitourinary: Negative for  dysuria. Musculoskeletal: Negative for back pain. Denies pedal edema. Skin: Negative for rash. Neurological: Negative for headaches, focal weakness or numbness.  10-point ROS otherwise negative.  ____________________________________________   PHYSICAL EXAM:  VITAL SIGNS: ED Triage Vitals  Enc Vitals Group     BP 09/07/14 1132 136/78 mmHg     Pulse Rate 09/07/14 1132 93     Resp --      Temp 09/07/14 1132 98.2 F (36.8 C)     Temp Source 09/07/14 1132 Oral     SpO2 09/07/14 1132 98 %     Weight 09/07/14 1132 154 lb (69.854 kg)     Height 09/07/14 1132  (1.854 m)     Head Cir --      Peak Flow --      Pain Score --      Pain Loc --      Pain Edu? --      Excl. in GC? --     Constitutional: Alert and oriented. Well appearing and in no acute distress. Eyes: Conjunctivae are normal. PERRL. EOMI. Head: Atraumatic. Nose: No congestion/rhinnorhea. Mouth/Throat: Mucous membranes are moist.   Neck: No stridor.   Cardiovascular: Normal rate, regular rhythm. Grossly normal heart sounds.  Good peripheral circulation. Respiratory: Normal respiratory effort.  No retractions. Lungs CTAB. Musculoskeletal: No lower extremity tenderness nor edema.  No joint effusions. Neurologic:  Normal speech and language. No gross focal neurologic deficits are appreciated. Speech  is normal. No gait instability. Skin:  Skin is warm, dry and intact. No rash noted. Psychiatric: Mood and affect are normal. Speech and behavior are normal.  ____________________________________________   LABS (all labs ordered are listed, but only abnormal results are displayed)  Labs Reviewed - No data to display ____________________________________________  EKG  Deferred ____________________________________________  RADIOLOGY  Deferred ____________________________________________   PROCEDURES  Procedure(s) performed: None  Critical Care performed:  No  ____________________________________________   INITIAL IMPRESSION / ASSESSMENT AND PLAN / ED COURSE  Pertinent labs & imaging results that were available during my care of the patient were reviewed by me and considered in my medical decision making (see chart for details).  Chronic systolic congestive heart failure patient out of medications. Patient here for med refill Rx given for coronary and Lasix. She is to follow-up as needed for any worsening symptomology. Patient voices no other emergency medical complaints at this visit. ____________________________________________   FINAL CLINICAL IMPRESSION(S) / ED DIAGNOSES  Final diagnoses:  Chronic systolic congestive heart failure       Evangeline Dakin, PA-C 09/07/14 1309  Sharman Cheek, MD 09/07/14 1623

## 2014-09-07 NOTE — ED Notes (Signed)
Pt states she is out of her "fluid pill and blood pressure pill", pt states she also has a cough, pt states SOB with activity, pt in no distress upon assessment

## 2015-06-05 ENCOUNTER — Emergency Department
Admission: EM | Admit: 2015-06-05 | Discharge: 2015-06-05 | Disposition: A | Discharge status: Home / Self Care | Payer: Self-pay | Attending: Emergency Medicine | Admitting: Emergency Medicine

## 2015-06-05 DIAGNOSIS — F17210 Nicotine dependence, cigarettes, uncomplicated: Secondary | ICD-10-CM | POA: Insufficient documentation

## 2015-06-05 DIAGNOSIS — I509 Heart failure, unspecified: Secondary | ICD-10-CM | POA: Insufficient documentation

## 2015-06-05 LAB — BASIC METABOLIC PANEL
Anion gap: 5 (ref 5–15)
BUN: 21 mg/dL — AB (ref 6–20)
CALCIUM: 9.2 mg/dL (ref 8.9–10.3)
CHLORIDE: 107 mmol/L (ref 101–111)
CO2: 24 mmol/L (ref 22–32)
CREATININE: 1.22 mg/dL — AB (ref 0.44–1.00)
GFR calc non Af Amer: 46 mL/min — ABNORMAL LOW (ref >60)
GFR, EST AFRICAN AMERICAN: 53 mL/min — AB (ref >60)
GLUCOSE: 98 mg/dL (ref 65–99)
Potassium: 4.3 mmol/L (ref 3.5–5.1)
Sodium: 136 mmol/L (ref 135–145)

## 2015-06-05 LAB — MAGNESIUM: Magnesium: 2.2 mg/dL (ref 1.7–2.4)

## 2015-06-05 MED ORDER — FUROSEMIDE 40 MG PO TABS
20.0000 mg | ORAL_TABLET | Freq: Once | ORAL | Status: AC
  Administered 2015-06-05: 20 mg via ORAL
  Filled 2015-06-05: qty 1

## 2015-06-05 MED ORDER — CARVEDILOL 6.25 MG PO TABS
3.1250 mg | ORAL_TABLET | Freq: Two times a day (BID) | ORAL | Status: DC
  Administered 2015-06-05: 3.125 mg via ORAL
  Filled 2015-06-05: qty 1
  Filled 2015-06-05: qty 0.5

## 2015-06-05 MED ORDER — FUROSEMIDE 20 MG PO TABS: 20.0000 mg | ORAL_TABLET | Freq: Every day | ORAL | Status: DC

## 2015-06-05 MED ORDER — CARVEDILOL 3.125 MG PO TABS: 3.1250 mg | ORAL_TABLET | Freq: Two times a day (BID) | ORAL | Status: DC

## 2015-06-05 NOTE — Discharge Instructions (Signed)

## 2015-06-05 NOTE — ED Provider Notes (Signed)
[facility] Emergency Department Provider Note  ____________________________________________  Time seen: Approximately 4:05 PM  I have reviewed the triage vital signs and the nursing notes.   HISTORY  Chief Complaint Medication Refill    HPI Barbara Oliver is a 65 y.o. female with a past medical history of congestive heart failure presents for evaluation and refill her medication. States that she usually takes score again Lasix with no problems. Is not any lab work recently. States that she does not have a PCP secondary to cost reports that [facility] too [facility] and not enough for [facility]. Denies any medical complaints no shortness of breath no swelling no edema and no chest pains.   Past Medical History  Diagnosis Date  . CHF (congestive heart failure)     There are no active problems to display for this patient.   No past surgical history on file.  Current Outpatient Rx  Name  Route  Sig  Dispense  Refill  . carvedilol (COREG) 3.125 MG tablet   Oral   Take 1 tablet (3.125 mg total) by mouth 2 (two) times daily.   60 tablet   3   . furosemide (LASIX) 20 MG tablet   Oral   Take 1 tablet (20 mg total) by mouth daily.   30 tablet   3     Allergies Review of patient's allergies indicates no known allergies.  No family history on file.  Social History Social History  Substance Use Topics  . Smoking status: Current Every Day Smoker -- 0.50 packs/day  . Smokeless tobacco: Not on file  . Alcohol Use: No    Review of Systems Constitutional: No fever/chills Eyes: No visual changes. ENT: No sore throat. Cardiovascular: Denies chest pain. Respiratory: Denies shortness of breath. Gastrointestinal: No abdominal pain.  No nausea, no vomiting.  No diarrhea.  No constipation. Genitourinary: Negative for dysuria. Musculoskeletal: Negative for back pain. Skin: Negative for rash. Neurological:  Negative for headaches, focal weakness or numbness.  10-point ROS otherwise negative.  ____________________________________________   PHYSICAL EXAM:  VITAL SIGNS: ED Triage Vitals  Enc Vitals Group     BP 06/05/15 1523 132/64 mmHg     Pulse Rate 06/05/15 1523 82     Resp 06/05/15 1523 18     Temp 06/05/15 1523 98.5 F (36.9 C)     Temp Source 06/05/15 1523 Oral     SpO2 06/05/15 1523 100 %     Weight 06/05/15 1523 175 lb (79.379 kg)     Height 06/05/15 1523 6\' 1"  (1.854 m)     Head Cir --      Peak Flow --      Pain Score --      Pain Loc --      Pain Edu? --      Excl. in GC? --     Constitutional: Alert and oriented. Well appearing and in no acute distress. Neck: No stridor.  Negative JVD. Cardiovascular: Normal rate, regular rhythm. Grossly normal heart sounds.  Good peripheral circulation. Respiratory: Normal respiratory effort.  No retractions. Lungs CTAB. Musculoskeletal: No lower extremity tenderness nor edema.  No joint effusions. Neurologic:  Normal speech and language. No gross focal neurologic deficits are appreciated. No gait instability. Skin:  Skin is warm, dry and intact. No rash noted. Psychiatric: Mood and affect are normal. Speech and behavior are normal.  ____________________________________________   LABS (all labs ordered are listed,  but only abnormal results are displayed)  Labs Reviewed  BASIC METABOLIC PANEL - Abnormal; Notable for the following:    BUN 21 (*)    Creatinine, Ser 1.22 (*)    GFR calc non Af Amer 46 (*)    GFR calc Af Amer 53 (*)    All other components within normal limits  MAGNESIUM      PROCEDURES  Procedure(s) performed: None  Critical Care performed: No  ____________________________________________   INITIAL IMPRESSION / ASSESSMENT AND PLAN / ED COURSE  Pertinent labs & imaging results that were available during my care of the patient were reviewed by me and considered in my medical decision making (see  chart for details).  Congestive heart failure by history. Rx refill given for Coreg and Lasix. Patient to try to establish a local provider and follow up as needed. Nubain Baseline BMP and magnesium levels today. ____________________________________________   FINAL CLINICAL IMPRESSION(S) / ED DIAGNOSES  Final diagnoses:  Chronic congestive heart failure, unspecified congestive heart failure type (HCC)     This chart was dictated using voice recognition software/Dragon. Despite best efforts to proofread, errors can occur which can change the meaning. Any change was purely unintentional.   Evangeline Dakin, PA-C 06/05/15 1743  Emily Filbert, MD 06/05/15 [phone]

## 2015-06-05 NOTE — ED Notes (Signed)
Pt states she needs a refill for her b/p medication

## 2015-06-05 NOTE — ED Notes (Signed)
Pt for med refill. No acute complaints.

## 2015-06-11 ENCOUNTER — Emergency Department
Admission: EM | Admit: 2015-06-11 | Discharge: 2015-06-12 | Disposition: A | Discharge status: Home / Self Care | Payer: Self-pay | Attending: Emergency Medicine | Admitting: Emergency Medicine

## 2015-06-11 ENCOUNTER — Emergency Department: Payer: Self-pay

## 2015-06-11 DIAGNOSIS — R0602 Shortness of breath: Secondary | ICD-10-CM | POA: Insufficient documentation

## 2015-06-11 DIAGNOSIS — Z5321 Procedure and treatment not carried out due to patient leaving prior to being seen by health care provider: Secondary | ICD-10-CM | POA: Insufficient documentation

## 2015-06-11 LAB — CBC
HCT: 40.9 % (ref 35.0–47.0)
HEMOGLOBIN: 13.2 g/dL (ref 12.0–16.0)
MCH: 30 pg (ref 26.0–34.0)
MCHC: 32.3 g/dL (ref 32.0–36.0)
MCV: 93 fL (ref 80.0–100.0)
PLATELETS: 305 10*3/uL (ref 150–440)
RBC: 4.39 MIL/uL (ref 3.80–5.20)
RDW: 14.6 % — AB (ref 11.5–14.5)
WBC: 8.9 10*3/uL (ref 3.6–11.0)

## 2015-06-11 LAB — BASIC METABOLIC PANEL
Anion gap: 12 (ref 5–15)
BUN: 14 mg/dL (ref 6–20)
CO2: 21 mmol/L — AB (ref 22–32)
CREATININE: 1.11 mg/dL — AB (ref 0.44–1.00)
Calcium: 9.8 mg/dL (ref 8.9–10.3)
Chloride: 104 mmol/L (ref 101–111)
GFR calc Af Amer: 60 mL/min — ABNORMAL LOW (ref >60)
GFR, EST NON AFRICAN AMERICAN: 52 mL/min — AB (ref >60)
GLUCOSE: 126 mg/dL — AB (ref 65–99)
Potassium: 3.5 mmol/L (ref 3.5–5.1)
Sodium: 137 mmol/L (ref 135–145)

## 2015-06-11 LAB — TROPONIN I

## 2015-06-11 NOTE — ED Notes (Addendum)
Patient reports feeling short of breath "for couple days".  Patient breathing heavily with exertion. Patient reports she has not had blood pressure pill or lasix in about 1 month.

## 2015-06-13 ENCOUNTER — Telehealth: Payer: Self-pay | Admitting: Emergency Medicine

## 2015-06-13 NOTE — ED Notes (Signed)
Called patient due to lwot to inquire about condition and follow up plans. Left message asking her to call me about follow up plans.

## 2015-10-04 ENCOUNTER — Encounter: Payer: Self-pay | Admitting: Emergency Medicine

## 2015-10-04 ENCOUNTER — Emergency Department: Payer: Self-pay

## 2015-10-04 ENCOUNTER — Observation Stay
Admission: EM | Admit: 2015-10-04 | Discharge: 2015-10-06 | Disposition: A | Discharge status: Home / Self Care | Payer: Self-pay | Attending: Internal Medicine | Admitting: Internal Medicine

## 2015-10-04 DIAGNOSIS — Z9119 Patient's noncompliance with other medical treatment and regimen: Secondary | ICD-10-CM | POA: Insufficient documentation

## 2015-10-04 DIAGNOSIS — Z9889 Other specified postprocedural states: Secondary | ICD-10-CM | POA: Insufficient documentation

## 2015-10-04 DIAGNOSIS — M8580 Other specified disorders of bone density and structure, unspecified site: Secondary | ICD-10-CM | POA: Insufficient documentation

## 2015-10-04 DIAGNOSIS — R7401 Elevation of levels of liver transaminase levels: Secondary | ICD-10-CM

## 2015-10-04 DIAGNOSIS — I110 Hypertensive heart disease with heart failure: Principal | ICD-10-CM | POA: Insufficient documentation

## 2015-10-04 DIAGNOSIS — N289 Disorder of kidney and ureter, unspecified: Secondary | ICD-10-CM | POA: Insufficient documentation

## 2015-10-04 DIAGNOSIS — F17210 Nicotine dependence, cigarettes, uncomplicated: Secondary | ICD-10-CM | POA: Insufficient documentation

## 2015-10-04 DIAGNOSIS — I5023 Acute on chronic systolic (congestive) heart failure: Secondary | ICD-10-CM | POA: Insufficient documentation

## 2015-10-04 DIAGNOSIS — K219 Gastro-esophageal reflux disease without esophagitis: Secondary | ICD-10-CM | POA: Insufficient documentation

## 2015-10-04 DIAGNOSIS — I272 Other secondary pulmonary hypertension: Secondary | ICD-10-CM | POA: Insufficient documentation

## 2015-10-04 DIAGNOSIS — I420 Dilated cardiomyopathy: Secondary | ICD-10-CM | POA: Insufficient documentation

## 2015-10-04 DIAGNOSIS — D649 Anemia, unspecified: Secondary | ICD-10-CM | POA: Insufficient documentation

## 2015-10-04 DIAGNOSIS — I509 Heart failure, unspecified: Secondary | ICD-10-CM

## 2015-10-04 DIAGNOSIS — Z8249 Family history of ischemic heart disease and other diseases of the circulatory system: Secondary | ICD-10-CM | POA: Insufficient documentation

## 2015-10-04 DIAGNOSIS — R1011 Right upper quadrant pain: Secondary | ICD-10-CM

## 2015-10-04 DIAGNOSIS — R739 Hyperglycemia, unspecified: Secondary | ICD-10-CM | POA: Insufficient documentation

## 2015-10-04 DIAGNOSIS — J45909 Unspecified asthma, uncomplicated: Secondary | ICD-10-CM | POA: Insufficient documentation

## 2015-10-04 DIAGNOSIS — I340 Nonrheumatic mitral (valve) insufficiency: Secondary | ICD-10-CM | POA: Insufficient documentation

## 2015-10-04 DIAGNOSIS — Z79899 Other long term (current) drug therapy: Secondary | ICD-10-CM | POA: Insufficient documentation

## 2015-10-04 DIAGNOSIS — R1013 Epigastric pain: Secondary | ICD-10-CM

## 2015-10-04 DIAGNOSIS — R740 Nonspecific elevation of levels of transaminase and lactic acid dehydrogenase [LDH]: Secondary | ICD-10-CM | POA: Insufficient documentation

## 2015-10-04 LAB — TROPONIN I: Troponin I: <0.03 ng/mL (ref <0.03)

## 2015-10-04 LAB — BASIC METABOLIC PANEL
Anion gap: 11 (ref 5–15)
BUN: 26 mg/dL — ABNORMAL HIGH (ref 6–20)
CALCIUM: 9.2 mg/dL (ref 8.9–10.3)
CO2: 21 mmol/L — AB (ref 22–32)
CREATININE: 1.04 mg/dL — AB (ref 0.44–1.00)
Chloride: 107 mmol/L (ref 101–111)
GFR calc Af Amer: >60 mL/min (ref >60)
GFR calc non Af Amer: 56 mL/min — ABNORMAL LOW (ref >60)
Glucose, Bld: 102 mg/dL — ABNORMAL HIGH (ref 65–99)
Potassium: 4.5 mmol/L (ref 3.5–5.1)
SODIUM: 139 mmol/L (ref 135–145)

## 2015-10-04 LAB — CBC
HCT: 35.2 % (ref 35.0–47.0)
Hemoglobin: 11.4 g/dL — ABNORMAL LOW (ref 12.0–16.0)
MCH: 29.5 pg (ref 26.0–34.0)
MCHC: 32.4 g/dL (ref 32.0–36.0)
MCV: 91.3 fL (ref 80.0–100.0)
PLATELETS: 295 10*3/uL (ref 150–440)
RBC: 3.86 MIL/uL (ref 3.80–5.20)
RDW: 16.1 % — AB (ref 11.5–14.5)
WBC: 7.1 10*3/uL (ref 3.6–11.0)

## 2015-10-04 LAB — BRAIN NATRIURETIC PEPTIDE: B Natriuretic Peptide: 1486 pg/mL — ABNORMAL HIGH (ref 0.0–100.0)

## 2015-10-04 MED ORDER — FUROSEMIDE 10 MG/ML IJ SOLN
INTRAMUSCULAR | Status: AC
  Administered 2015-10-04: 40 mg via INTRAVENOUS
  Filled 2015-10-04: qty 4

## 2015-10-04 MED ORDER — FUROSEMIDE 10 MG/ML IJ SOLN
40.0000 mg | Freq: Once | INTRAMUSCULAR | Status: AC
  Administered 2015-10-04: 40 mg via INTRAVENOUS

## 2015-10-04 NOTE — ED Triage Notes (Signed)
Pt ambulatory with steady gait, pt c/o shortness of breath and abdominal distention since yesterday. Pt breathing labored, pursed lip breathing. Pt reports had hernia repair in 2005 or 2006. Pt reports feeling abdomen tight and hard, reports unable to breath when laying on side. Pt has hx of CHF. Pt alert and oriented x 4.

## 2015-10-04 NOTE — ED Provider Notes (Signed)
[facility] Emergency Department Provider Note  ____________________________________________  Time seen: 10:45 PM  I have reviewed the triage vital signs and the nursing notes.   HISTORY  Chief Complaint Shortness of Breath     HPI Barbara Oliver is a 65 y.o. female history of congestive heart failure presents with approximately 3 day history of progressive dyspnea, orthopnea abdominal tightness and dyspnea with decreasing levels of exertion. Patient denies any chest pain no lightheadedness no vomiting. Patient denies any lower extremity swelling      Past Medical History:  Diagnosis Date  . CHF (congestive heart failure) (HCC)     There are no active problems to display for this patient.   Past Surgical History:  Procedure Laterality Date  . HERNIA REPAIR       Allergies No known drug allergies No family history on file.  Social History Social History  Substance Use Topics  . Smoking status: Current Some Day Smoker    Packs/day: 0.00    Types: Cigarettes  . Smokeless tobacco: Never Used  . Alcohol use No    Review of Systems  Constitutional: Negative for fever. Eyes: Negative for visual changes. ENT: Negative for sore throat. Cardiovascular: Negative for chest pain. Respiratory: Positive for shortness of breath. Gastrointestinal: Negative for abdominal pain, vomiting and diarrhea. Genitourinary: Negative for dysuria. Musculoskeletal: Negative for back pain. Skin: Negative for rash. Neurological: Negative for headaches, focal weakness or numbness.   10-point ROS otherwise negative.  ____________________________________________   PHYSICAL EXAM:  VITAL SIGNS: ED Triage Vitals  Enc Vitals Group     BP 10/04/15 2137 112/90     Pulse Rate 10/04/15 2137 85     Resp 10/04/15 2137 (!) 26     Temp 10/04/15 2137 97.7 F (36.5 C)     Temp Source 10/04/15 2137 Oral     SpO2 10/04/15 2137 100 %     Weight 10/04/15 2137 172 lb  (78 kg)     Height 10/04/15 2137 6\' 1"  (1.854 m)     Head Circumference --      Peak Flow --      Pain Score 10/04/15 2138 9     Pain Loc --      Pain Edu? --      Excl. in GC? --      Constitutional: Alert and oriented. Well appearing and in no distress. Eyes: Conjunctivae are normal. PERRL. Normal extraocular movements. ENT   Head: Normocephalic and atraumatic.   Nose: No congestion/rhinnorhea.   Mouth/Throat: Mucous membranes are moist.   Neck: No stridor. Hematological/Lymphatic/Immunilogical: No cervical lymphadenopathy. Cardiovascular: Normal rate, regular rhythm. Normal and symmetric distal pulses are present in all extremities. No murmurs, rubs, or gallops. Respiratory: tachypnea Positive accessory respiratory muscle use. Breath sounds are clear and equal bilaterally. No wheezes/rales/rhonchi. Gastrointestinal: Soft and nontender. No distention. There is no CVA tenderness. Genitourinary: deferred Musculoskeletal: Nontender with normal range of motion in all extremities. No joint effusions.  No lower extremity tenderness nor edema. Neurologic:  Normal speech and language. No gross focal neurologic deficits are appreciated. Speech is normal.  Skin:  Skin is warm, dry and intact. No rash noted. Psychiatric: Mood and affect are normal. Speech and behavior are normal. Patient exhibits appropriate insight and judgment.  ____________________________________________    LABS (pertinent positives/negatives)  Labs Reviewed  CBC - Abnormal; Notable for the following:       Result Value   Hemoglobin 11.4 (*)    RDW 16.1 (*)  All other components within normal limits  BRAIN NATRIURETIC PEPTIDE - Abnormal; Notable for the following:    B Natriuretic Peptide 1,486.0 (*)    All other components within normal limits  BASIC METABOLIC PANEL  TROPONIN I        EKG  ED ECG REPORT I, Downey N Countess Biebel, the attending physician, personally viewed and interpreted this  ECG.   Date: 10/05/2015  EKG Time: 10:06 PM  Rate: 80  Rhythm: Normal sinus rhythm  Axis: Normal  Intervals: Normal  ST&T Change: None   ____________________________________________    RADIOLOGY  CLINICAL DATA:  65 year old female with shortness of breath EXAM: CHEST  2 VIEW COMPARISON:  Chest radiograph dated 06/11/2015 FINDINGS: There is mild cardiomegaly with increased vascular and interstitial prominence compatible with congestive changes. Bilateral mid to lower lung fields hazy densities likely represent congestion. Pneumonia is not excluded. Clinical correlation is recommended. There are interstitial prominence with Kerley B-lines. There is no focal consolidation. No pleural effusion or pneumothorax. There is osteopenia with degenerative changes of spine. No acute osseous pathology. IMPRESSION: Stable mild cardiomegaly with congestive changes and mild interstitial edema. Superimposed pneumonia is not excluded. Clinical correlation is recommended. Electronically Signed   By: Elgie Collard M.D.   On: 10/04/2015 22:24   Procedures    INITIAL IMPRESSION / ASSESSMENT AND PLAN / ED COURSE  Pertinent labs & imaging results that were available during my care of the patient were reviewed by me and considered in my medical decision making (see chart for details).  Patient given 40 mg IV Lasix with some symptomatic improvement  ____________________________________________   FINAL CLINICAL IMPRESSION(S) / ED DIAGNOSES  Final diagnoses:  Acute on chronic congestive heart failure, unspecified congestive heart failure type (HCC)      Darci Current, MD 10/05/15 [phone]

## 2015-10-05 ENCOUNTER — Encounter: Payer: Self-pay | Admitting: Internal Medicine

## 2015-10-05 ENCOUNTER — Observation Stay
Admit: 2015-10-05 | Discharge: 2015-10-05 | Disposition: A | Discharge status: Home / Self Care | Payer: Self-pay | Attending: Internal Medicine | Admitting: Internal Medicine

## 2015-10-05 DIAGNOSIS — I5023 Acute on chronic systolic (congestive) heart failure: Secondary | ICD-10-CM | POA: Diagnosis present

## 2015-10-05 LAB — BASIC METABOLIC PANEL
ANION GAP: 8 (ref 5–15)
BUN: 26 mg/dL — ABNORMAL HIGH (ref 6–20)
CALCIUM: 9 mg/dL (ref 8.9–10.3)
CHLORIDE: 104 mmol/L (ref 101–111)
CO2: 28 mmol/L (ref 22–32)
Creatinine, Ser: 1.02 mg/dL — ABNORMAL HIGH (ref 0.44–1.00)
GFR calc non Af Amer: 57 mL/min — ABNORMAL LOW (ref >60)
GLUCOSE: 111 mg/dL — AB (ref 65–99)
POTASSIUM: 3.5 mmol/L (ref 3.5–5.1)
Sodium: 140 mmol/L (ref 135–145)

## 2015-10-05 LAB — CBC
HEMATOCRIT: 34 % — AB (ref 35.0–47.0)
HEMOGLOBIN: 11.2 g/dL — AB (ref 12.0–16.0)
MCH: 29.6 pg (ref 26.0–34.0)
MCHC: 33 g/dL (ref 32.0–36.0)
MCV: 89.8 fL (ref 80.0–100.0)
Platelets: 287 10*3/uL (ref 150–440)
RBC: 3.78 MIL/uL — AB (ref 3.80–5.20)
RDW: 15.8 % — ABNORMAL HIGH (ref 11.5–14.5)
WBC: 7.1 10*3/uL (ref 3.6–11.0)

## 2015-10-05 LAB — HEPATIC FUNCTION PANEL
ALT: 54 U/L (ref 14–54)
AST: 56 U/L — ABNORMAL HIGH (ref 15–41)
Albumin: 3.2 g/dL — ABNORMAL LOW (ref 3.5–5.0)
Alkaline Phosphatase: 133 U/L — ABNORMAL HIGH (ref 38–126)
Bilirubin, Direct: <0.1 mg/dL — ABNORMAL LOW (ref 0.1–0.5)
Total Bilirubin: 0.6 mg/dL (ref 0.3–1.2)
Total Protein: 7.4 g/dL (ref 6.5–8.1)

## 2015-10-05 LAB — ECHOCARDIOGRAM COMPLETE
Height: 73 in
Weight: 2528 oz

## 2015-10-05 LAB — HEMOGLOBIN A1C: HEMOGLOBIN A1C: 5.9 % (ref 4.0–6.0)

## 2015-10-05 LAB — TROPONIN I
Troponin I: <0.03 ng/mL (ref <0.03)
Troponin I: <0.03 ng/mL (ref <0.03)
Troponin I: <0.03 ng/mL (ref <0.03)

## 2015-10-05 MED ORDER — FUROSEMIDE 20 MG PO TABS: 20.0000 mg | ORAL_TABLET | Freq: Every day | ORAL | Status: DC

## 2015-10-05 MED ORDER — FUROSEMIDE 40 MG PO TABS
40.0000 mg | ORAL_TABLET | Freq: Every day | ORAL | Status: DC
  Administered 2015-10-05 – 2015-10-06 (×2): 40 mg via ORAL
  Filled 2015-10-05 (×2): qty 1

## 2015-10-05 MED ORDER — ONDANSETRON HCL 4 MG PO TABS: 4.0000 mg | ORAL_TABLET | Freq: Four times a day (QID) | ORAL | Status: DC | PRN

## 2015-10-05 MED ORDER — SODIUM CHLORIDE 0.9% FLUSH
3.0000 mL | Freq: Two times a day (BID) | INTRAVENOUS | Status: DC
  Administered 2015-10-05 – 2015-10-06 (×3): 3 mL via INTRAVENOUS

## 2015-10-05 MED ORDER — FUROSEMIDE 10 MG/ML IJ SOLN
20.0000 mg | Freq: Once | INTRAMUSCULAR | Status: AC
  Administered 2015-10-05: 20 mg via INTRAVENOUS
  Filled 2015-10-05: qty 2

## 2015-10-05 MED ORDER — ACETAMINOPHEN 325 MG PO TABS
650.0000 mg | ORAL_TABLET | Freq: Four times a day (QID) | ORAL | Status: DC | PRN
  Administered 2015-10-05: 650 mg via ORAL
  Filled 2015-10-05: qty 2

## 2015-10-05 MED ORDER — LEVOFLOXACIN IN D5W 500 MG/100ML IV SOLN
500.0000 mg | INTRAVENOUS | Status: DC
  Administered 2015-10-05: 500 mg via INTRAVENOUS
  Filled 2015-10-05 (×2): qty 100

## 2015-10-05 MED ORDER — CARVEDILOL 3.125 MG PO TABS
3.1250 mg | ORAL_TABLET | Freq: Two times a day (BID) | ORAL | Status: DC
  Administered 2015-10-05 – 2015-10-06 (×4): 3.125 mg via ORAL
  Filled 2015-10-05 (×4): qty 1

## 2015-10-05 MED ORDER — POTASSIUM CHLORIDE CRYS ER 20 MEQ PO TBCR
20.0000 meq | EXTENDED_RELEASE_TABLET | Freq: Two times a day (BID) | ORAL | Status: DC
  Administered 2015-10-05 – 2015-10-06 (×3): 20 meq via ORAL
  Filled 2015-10-05 (×3): qty 1

## 2015-10-05 MED ORDER — ACETAMINOPHEN 650 MG RE SUPP: 650.0000 mg | Freq: Four times a day (QID) | RECTAL | Status: DC | PRN

## 2015-10-05 MED ORDER — ENOXAPARIN SODIUM 40 MG/0.4ML ~~LOC~~ SOLN
40.0000 mg | SUBCUTANEOUS | Status: DC
  Administered 2015-10-05: 40 mg via SUBCUTANEOUS
  Filled 2015-10-05: qty 0.4

## 2015-10-05 MED ORDER — ONDANSETRON HCL 4 MG/2ML IJ SOLN: 4.0000 mg | Freq: Four times a day (QID) | INTRAMUSCULAR | Status: DC | PRN

## 2015-10-05 NOTE — Progress Notes (Signed)
Initial Heart Failure Clinic appointment scheduled on October 18, 2015 at 12:30pm. Thank you.

## 2015-10-05 NOTE — Care Management (Signed)
Admitted with CHF exacerbation. Referral to heart failure clinic through protal. Met with patient at bedside. She lives at home with her spouse. She is independent, active and drives. Uses no DME, No home O2. Uninsured. Provided patient with a list of local providers accepting patients, medication management and [facility] applications. Email sent to Anell Barr for [facility] follow up. No further needs identified.

## 2015-10-05 NOTE — Consult Note (Signed)
Reason for Consult: Congestive heart failure acute on chronic Referring Physician: Hospitalist Lance Coon MD  Barbara Oliver is an 65 y.o. female.  HPI: Patient's a 65 year old female acute on chronic heart failure she was developed heart failure about a year ago has not had consistent follow-up does not have a primary has not been able to see cardiologist because of finances if she comes to emergency room and gets limited medications failed. Patient reportedly is on Lasix and Coreg only she still smokes has some reflux symptoms started complaining of congestion shortness of breath over the last few days with mild leg swelling denies any chest pain no blackout spells or syncope she was apparently admitted diuresis somewhat placed on oxygen and now she feels better. Patient states she's wearing (Medicare eligible to secure routine follow-up with primary physician's his cardiologist. Denies any coronary disease that she is aware of no heart attacks that she is aware denies significant narcotic consumption. Patient recently in the hospital about 2-3 months ago with similar presentation.  Past Medical History:  Diagnosis Date  . CHF (congestive heart failure) ([place])     Past Surgical History:  Procedure Laterality Date  . HERNIA REPAIR      Family History  Problem Relation Age of Onset  . Hypertension Mother   . Heart attack Father     Social History:  reports that she has been smoking Cigarettes.  She has been smoking about 0.00 packs per day. She has never used smokeless tobacco. She reports that she does not drink alcohol or use drugs.  Allergies: No Known Allergies  Medications: I have reviewed the patient's current medications.  Results for orders placed or performed during the hospital encounter of 10/04/15 (from the past 48 hour(s))  Basic metabolic panel     Status: Abnormal   Collection Time: 10/04/15 10:07 PM  Result Value Ref Range   Sodium 139 135 - 145 mmol/L   Potassium 4.5  3.5 - 5.1 mmol/L   Chloride 107 101 - 111 mmol/L   CO2 21 (L) 22 - 32 mmol/L   Glucose, Bld 102 (H) 65 - 99 mg/dL   BUN 26 (H) 6 - 20 mg/dL   Creatinine, Ser 1.04 (H) 0.44 - 1.00 mg/dL   Calcium 9.2 8.9 - 10.3 mg/dL   GFR calc non Af Amer 56 (L) >60 mL/min   GFR calc Af Amer >60 >60 mL/min    Comment: (NOTE) The eGFR has been calculated using the CKD EPI equation. This calculation has not been validated in all clinical situations. eGFR's persistently <60 mL/min signify possible Chronic Kidney Disease.    Anion gap 11 5 - 15  CBC     Status: Abnormal   Collection Time: 10/04/15 10:07 PM  Result Value Ref Range   WBC 7.1 3.6 - 11.0 K/uL   RBC 3.86 3.80 - 5.20 MIL/uL   Hemoglobin 11.4 (L) 12.0 - 16.0 g/dL   HCT 35.2 35.0 - 47.0 %   MCV 91.3 80.0 - 100.0 fL   MCH 29.5 26.0 - 34.0 pg   MCHC 32.4 32.0 - 36.0 g/dL   RDW 16.1 (H) 11.5 - 14.5 %   Platelets 295 150 - 440 K/uL  Troponin I     Status: None   Collection Time: 10/04/15 10:07 PM  Result Value Ref Range   Troponin I <0.03 <0.03 ng/mL  Brain natriuretic peptide     Status: Abnormal   Collection Time: 10/04/15 10:08 PM  Result Value Ref Range  B Natriuretic Peptide 1,486.0 (H) 0.0 - 100.0 pg/mL  Basic metabolic panel     Status: Abnormal   Collection Time: 10/05/15  4:51 AM  Result Value Ref Range   Sodium 140 135 - 145 mmol/L   Potassium 3.5 3.5 - 5.1 mmol/L   Chloride 104 101 - 111 mmol/L   CO2 28 22 - 32 mmol/L   Glucose, Bld 111 (H) 65 - 99 mg/dL   BUN 26 (H) 6 - 20 mg/dL   Creatinine, Ser 1.02 (H) 0.44 - 1.00 mg/dL   Calcium 9.0 8.9 - 10.3 mg/dL   GFR calc non Af Amer 57 (L) >60 mL/min   GFR calc Af Amer >60 >60 mL/min    Comment: (NOTE) The eGFR has been calculated using the CKD EPI equation. This calculation has not been validated in all clinical situations. eGFR's persistently <60 mL/min signify possible Chronic Kidney Disease.    Anion gap 8 5 - 15  CBC     Status: Abnormal   Collection Time: 10/05/15   4:51 AM  Result Value Ref Range   WBC 7.1 3.6 - 11.0 K/uL   RBC 3.78 (L) 3.80 - 5.20 MIL/uL   Hemoglobin 11.2 (L) 12.0 - 16.0 g/dL   HCT 34.0 (L) 35.0 - 47.0 %   MCV 89.8 80.0 - 100.0 fL   MCH 29.6 26.0 - 34.0 pg   MCHC 33.0 32.0 - 36.0 g/dL   RDW 15.8 (H) 11.5 - 14.5 %   Platelets 287 150 - 440 K/uL  Troponin I     Status: None   Collection Time: 10/05/15  4:51 AM  Result Value Ref Range   Troponin I <0.03 <0.03 ng/mL  Troponin I     Status: None   Collection Time: 10/05/15  9:56 AM  Result Value Ref Range   Troponin I <0.03 <0.03 ng/mL  Hepatic function panel     Status: Abnormal   Collection Time: 10/05/15  9:56 AM  Result Value Ref Range   Total Protein 7.4 6.5 - 8.1 g/dL   Albumin 3.2 (L) 3.5 - 5.0 g/dL   AST 56 (H) 15 - 41 U/L   ALT 54 14 - 54 U/L   Alkaline Phosphatase 133 (H) 38 - 126 U/L   Total Bilirubin 0.6 0.3 - 1.2 mg/dL   Bilirubin, Direct <0.1 (L) 0.1 - 0.5 mg/dL   Indirect Bilirubin NOT CALCULATED 0.3 - 0.9 mg/dL    Dg Chest 2 View  Result Date: 10/04/2015 CLINICAL DATA:  65 year old female with shortness of breath EXAM: CHEST  2 VIEW COMPARISON:  Chest radiograph dated 06/11/2015 FINDINGS: There is mild cardiomegaly with increased vascular and interstitial prominence compatible with congestive changes. Bilateral mid to lower lung fields hazy densities likely represent congestion. Pneumonia is not excluded. Clinical correlation is recommended. There are interstitial prominence with Kerley B-lines. There is no focal consolidation. No pleural effusion or pneumothorax. There is osteopenia with degenerative changes of spine. No acute osseous pathology. IMPRESSION: Stable mild cardiomegaly with congestive changes and mild interstitial edema. Superimposed pneumonia is not excluded. Clinical correlation is recommended. Electronically Signed   By: Anner Crete M.D.   On: 10/04/2015 22:24   Review of Systems  Constitutional: Positive for malaise/fatigue.  HENT:  Positive for congestion.   Eyes: Negative.   Respiratory: Positive for cough and shortness of breath.   Cardiovascular: Positive for palpitations, orthopnea, leg swelling and PND.  Gastrointestinal: Negative.   Genitourinary: Negative.   Musculoskeletal: Negative.   Skin:  Negative.   Neurological: Positive for weakness.  Endo/Heme/Allergies: Negative.   Psychiatric/Behavioral: Negative.    Blood pressure 106/62, pulse 79, temperature 97.8 F (36.6 C), temperature source Oral, resp. rate 19, height 6' 1" (1.854 m), weight 71.7 kg (158 lb), SpO2 95 %. Physical Exam  Nursing note and vitals reviewed. Constitutional: Vital signs are normal. She appears cachectic. She is cooperative. She has a sickly appearance.  HENT:  Head: Normocephalic.  Eyes: Conjunctivae and EOM are normal. Pupils are equal, round, and reactive to light.  Neck: Normal range of motion. Neck supple.  Cardiovascular: Normal rate.  Exam reveals gallop.   Murmur heard. Respiratory: She has decreased breath sounds. She has rales.  GI: Soft. Bowel sounds are normal.  Musculoskeletal: Normal range of motion.  Neurological: She is alert.  Skin: Skin is warm and dry.    Assessment/Plan: Congestive heart failure acute on chronic Cardiomyopathy with systolic dysfunction Hypertension Smoking GERD Noncompliance . PLAN Agree with admission to telemetry rule out myocardial infarction Continue diuretic therapy to help with heart failure Continue Coreg may try to increase the dose to 6.25 twice a day Add ACE inhibitor for heart failure management Continue Lasix therapy for diuresis Agree with referring the patient to heart failure clinic Advised to quit smoking as Prilosec hypertension Continue Protonix therapy for reflux symptoms   CALLWOOD,DWAYNE D. 10/05/2015, 1:13 PM

## 2015-10-05 NOTE — Discharge Instructions (Signed)
[facility] Failure Clinic appointment on October 18, 2015 at 12:30pm with Clarisa Kindred, FNP. Please call [phone] to reschedule.   Please tell patient to go to Pre-Admit Testing on that day before coming to the Clinic. PAT is located in [id number] on the 2nd floor of the [employer].Marland KitchenMarland Kitchen

## 2015-10-05 NOTE — H&P (Signed)
[facility] - [place] at [facility]   PATIENT NAME: Barbara Oliver    MR#:  [id number]  DATE OF BIRTH:  September 14, 1950  DATE OF ADMISSION:  10/04/2015  PRIMARY CARE PHYSICIAN: No PCP Per Patient   REQUESTING/REFERRING PHYSICIAN: Manson Passey, MD  CHIEF COMPLAINT:   Chief Complaint  Patient presents with  . Shortness of Breath    HISTORY OF PRESENT ILLNESS:  Barbara Oliver  is a 65 y.o. female who presents with Acute onset shortness of breath. Patient states that she became acutely short of breath this afternoon, progressed some in the evening and she came to the ED. She states that she has had an episode like this last year which she was told she had fluid on her lungs, and was given diuretics with good improvement. She was told she had heart failure at that time and was sent home on Lasix. She states that she feels that her dose may not be strong enough as she has fluid on her lungs again tonight. She does not recall ever having an echocardiogram, and does not follow with a cardiologist. X-ray today shows pulmonary edema and her BNP is 1,400. Hospitalists were called for admission  PAST MEDICAL HISTORY:   Past Medical History:  Diagnosis Date  . CHF (congestive heart failure) (HCC)     PAST SURGICAL HISTORY:   Past Surgical History:  Procedure Laterality Date  . HERNIA REPAIR      SOCIAL HISTORY:   Social History  Substance Use Topics  . Smoking status: Current Some Day Smoker    Packs/day: 0.00    Types: Cigarettes  . Smokeless tobacco: Never Used  . Alcohol use No    FAMILY HISTORY:   Family History  Problem Relation Age of Onset  . Hypertension Mother   . Heart attack Father     DRUG ALLERGIES:  No Known Allergies  MEDICATIONS AT HOME:   Prior to Admission medications   Medication Sig Start Date End Date Taking? Authorizing Provider  carvedilol (COREG) 3.125 MG tablet Take 1 tablet (3.125 mg total) by mouth 2 (two) times daily. 06/05/15  06/04/16  Charmayne Sheer Beers, PA-C  furosemide (LASIX) 20 MG tablet Take 1 tablet (20 mg total) by mouth daily. 06/05/15 06/04/16  Evangeline Dakin, PA-C    REVIEW OF SYSTEMS:  Review of Systems  Constitutional: Negative for chills, fever, malaise/fatigue and weight loss.  HENT: Negative for ear pain, hearing loss and tinnitus.   Eyes: Negative for blurred vision, double vision, pain and redness.  Respiratory: Positive for cough and shortness of breath. Negative for hemoptysis.   Cardiovascular: Positive for leg swelling. Negative for chest pain, palpitations and orthopnea.  Gastrointestinal: Negative for abdominal pain, constipation, diarrhea, nausea and vomiting.  Genitourinary: Negative for dysuria, frequency and hematuria.  Musculoskeletal: Negative for back pain, joint pain and neck pain.  Skin:       No acne, rash, or lesions  Neurological: Negative for dizziness, tremors, focal weakness and weakness.  Endo/Heme/Allergies: Negative for polydipsia. Does not bruise/bleed easily.  Psychiatric/Behavioral: Negative for depression. The patient is not nervous/anxious and does not have insomnia.      VITAL SIGNS:   Vitals:   10/04/15 2304 10/04/15 2310 10/05/15 0030 10/05/15 0100  BP: 120/78 (!) 125/92 116/74 122/76  Pulse: 90   87  Resp: (!) 24 (!) 24 (!) 23 (!) 26  Temp: 98.1 F (36.7 C)     TempSrc: Oral     SpO2: 98% 100%  99%  Weight:      Height:       Wt Readings from Last 3 Encounters:  10/04/15 78 kg (172 lb)  06/11/15 79.4 kg (175 lb)  06/05/15 79.4 kg (175 lb)    PHYSICAL EXAMINATION:  Physical Exam  Vitals reviewed. Constitutional: She is oriented to person, place, and time. She appears well-developed and well-nourished. No distress.  HENT:  Head: Normocephalic and atraumatic.  Mouth/Throat: Oropharynx is clear and moist.  Eyes: Conjunctivae and EOM are normal. Pupils are equal, round, and reactive to light. No scleral icterus.  Neck: Normal range of motion. Neck  supple. No JVD present. No thyromegaly present.  Cardiovascular: Normal rate, regular rhythm and intact distal pulses.  Exam reveals no gallop and no friction rub.   No murmur heard. Respiratory: Effort normal. No respiratory distress. She has no wheezes. She has rales.  GI: Soft. Bowel sounds are normal. She exhibits no distension. There is no tenderness.  Musculoskeletal: Normal range of motion. She exhibits edema (trace bilateral).  No arthritis, no gout  Lymphadenopathy:    She has no cervical adenopathy.  Neurological: She is alert and oriented to person, place, and time. No cranial nerve deficit.  No dysarthria, no aphasia  Skin: Skin is warm and dry. No rash noted. No erythema.  Psychiatric: She has a normal mood and affect. Her behavior is normal. Judgment and thought content normal.    LABORATORY PANEL:   CBC  Recent Labs Lab 10/04/15 2207  WBC 7.1  HGB 11.4*  HCT 35.2  PLT 295   ------------------------------------------------------------------------------------------------------------------  Chemistries   Recent Labs Lab 10/04/15 2207  NA 139  K 4.5  CL 107  CO2 21*  GLUCOSE 102*  BUN 26*  CREATININE 1.04*  CALCIUM 9.2   ------------------------------------------------------------------------------------------------------------------  Cardiac Enzymes  Recent Labs Lab 10/04/15 2207  TROPONINI <0.03   ------------------------------------------------------------------------------------------------------------------  RADIOLOGY:  Dg Chest 2 View  Result Date: 10/04/2015 CLINICAL DATA:  64 year old female with shortness of breath EXAM: CHEST  2 VIEW COMPARISON:  Chest radiograph dated 06/11/2015 FINDINGS: There is mild cardiomegaly with increased vascular and interstitial prominence compatible with congestive changes. Bilateral mid to lower lung fields hazy densities likely represent congestion. Pneumonia is not excluded. Clinical correlation is  recommended. There are interstitial prominence with Kerley B-lines. There is no focal consolidation. No pleural effusion or pneumothorax. There is osteopenia with degenerative changes of spine. No acute osseous pathology. IMPRESSION: Stable mild cardiomegaly with congestive changes and mild interstitial edema. Superimposed pneumonia is not excluded. Clinical correlation is recommended. Electronically Signed   By: Elgie Collard M.D.   On: 10/04/2015 22:24   EKG:   Orders placed or performed during the hospital encounter of 10/04/15  . ED EKG within 10 minutes  . ED EKG within 10 minutes    IMPRESSION AND PLAN:  Principal Problem:   Acute on chronic systolic CHF (congestive heart failure) (HCC) - per patient this is the second time she is being hospitalized for this over the past year, but she does not follow the cardiologist and does not recall having had an echocardiogram done. I am unable to find any record of this having been done on chart review as well. She has started responding to a dose of IV Lasix in the ED, we will admit her to telemetry, continue diuresis, trend her cardiac enzymes, and get an echocardiogram and a cardiology consult.  All the records are reviewed and case discussed with ED provider. Management plans discussed with  the patient and/or family.  DVT PROPHYLAXIS: SubQ lovenox  GI PROPHYLAXIS: None  ADMISSION STATUS: Observation  CODE STATUS: Full Code Status History    This patient does not have a recorded code status. Please follow your organizational policy for patients in this situation.      TOTAL TIME TAKING CARE OF THIS PATIENT: 40 minutes.    Alauna Hayden FIELDING 10/05/2015, 1:19 AM  [facility]  Office  [phone]  CC: Primary care physician; No PCP Per Patient

## 2015-10-05 NOTE — Progress Notes (Signed)
[facility] Physicians - [place] at [facility]   PATIENT NAME: Barbara Oliver    MR#:  [id number]  DATE OF BIRTH:  07-Jan-1951  SUBJECTIVE:  CHIEF COMPLAINT:   Chief Complaint  Patient presents with  . Shortness of Breath   The patient is a 65 year old American female with asthma, history significant for history of congestive heart failure, tobacco abuse, who presents to the hospital with complaints of shortness of breath, sudden onset. On arrival to the hospital patient was tachypneic, her labs revealed elevated BNP, mild anemia, hyperglycemia. Patient's chest x-ray revealed mild cardiomegaly and congestive heart failure changes, mild interstitial edema. She denies any cough, phlegm production, wheezing, admits of some shortness of breath, improved from yesterday Review of Systems  Constitutional: Negative for chills, fever and weight loss.  HENT: Negative for congestion.   Eyes: Negative for blurred vision and double vision.  Respiratory: Positive for shortness of breath. Negative for cough, sputum production and wheezing.   Cardiovascular: Negative for chest pain, palpitations, orthopnea, leg swelling and PND.  Gastrointestinal: Negative for abdominal pain, blood in stool, constipation, diarrhea, nausea and vomiting.  Genitourinary: Negative for dysuria, frequency, hematuria and urgency.  Musculoskeletal: Negative for falls.  Neurological: Negative for dizziness, tremors, focal weakness and headaches.  Endo/Heme/Allergies: Does not bruise/bleed easily.  Psychiatric/Behavioral: Negative for depression. The patient does not have insomnia.     VITAL SIGNS: Blood pressure 106/62, pulse 79, temperature 97.8 F (36.6 C), temperature source Oral, resp. rate 19, height  (1.854 m), weight 71.7 kg (158 lb), SpO2 95 %.  PHYSICAL EXAMINATION:   GENERAL:  65 y.o.-year-old patient lying in the bed with no acute distress.  EYES: Pupils equal, round, reactive to light and  accommodation. No scleral icterus. Extraocular muscles intact.  HEENT: Head atraumatic, normocephalic. Oropharynx and nasopharynx clear.  NECK:  Supple, no jugular venous distention. No thyroid enlargement, no tenderness.  LUNGS: Normal breath sounds bilaterally, no wheezing, rales,rhonchi , basilar crepitations noted in both lung fields posteriorly. Intermittent use of accessory muscles of respiration, mostly with movement, speech.  CARDIOVASCULAR: S1, S2 normal. No murmurs, rubs, or gallops.  ABDOMEN: Soft, right upper quadrant pain on palpation, plus Murphy's sign, nondistended. Bowel sounds present. No organomegaly or mass.  EXTREMITIES: No pedal edema, cyanosis, or clubbing.  NEUROLOGIC: Cranial nerves II through XII are intact. Muscle strength 5/5 in all extremities. Sensation intact. Gait not checked.  PSYCHIATRIC: The patient is alert and oriented x 3.  SKIN: No obvious rash, lesion, or ulcer.   ORDERS/RESULTS REVIEWED:   CBC  Recent Labs Lab 10/04/15 2207 10/05/15 0451  WBC 7.1 7.1  HGB 11.4* 11.2*  HCT 35.2 34.0*  PLT 295 287  MCV 91.3 89.8  MCH 29.5 29.6  MCHC 32.4 33.0  RDW 16.1* 15.8*   ------------------------------------------------------------------------------------------------------------------  Chemistries   Recent Labs Lab 10/04/15 2207 10/05/15 0451 10/05/15 0956  NA 139 140  --   K 4.5 3.5  --   CL 107 104  --   CO2 21* 28  --   GLUCOSE 102* 111*  --   BUN 26* 26*  --   CREATININE 1.04* 1.02*  --   CALCIUM 9.2 9.0  --   AST  --   --  56*  ALT  --   --  54  ALKPHOS  --   --  133*  BILITOT  --   --  0.6   ------------------------------------------------------------------------------------------------------------------ estimated creatinine clearance is 63.9 mL/min (by C-G formula based on  SCr of 1.02 mg/dL). ------------------------------------------------------------------------------------------------------------------ No results for input(s):  TSH, T4TOTAL, T3FREE, THYROIDAB in the last 72 hours.  Invalid input(s): FREET3  Cardiac Enzymes  Recent Labs Lab 10/04/15 2207 10/05/15 0451 10/05/15 0956  TROPONINI <0.03 <0.03 <0.03   ------------------------------------------------------------------------------------------------------------------ Invalid input(s): POCBNP ---------------------------------------------------------------------------------------------------------------  RADIOLOGY: Dg Chest 2 View  Result Date: 10/04/2015 CLINICAL DATA:  65 year old female with shortness of breath EXAM: CHEST  2 VIEW COMPARISON:  Chest radiograph dated 06/11/2015 FINDINGS: There is mild cardiomegaly with increased vascular and interstitial prominence compatible with congestive changes. Bilateral mid to lower lung fields hazy densities likely represent congestion. Pneumonia is not excluded. Clinical correlation is recommended. There are interstitial prominence with Kerley B-lines. There is no focal consolidation. No pleural effusion or pneumothorax. There is osteopenia with degenerative changes of spine. No acute osseous pathology. IMPRESSION: Stable mild cardiomegaly with congestive changes and mild interstitial edema. Superimposed pneumonia is not excluded. Clinical correlation is recommended. Electronically Signed   By: Elgie Collard M.D.   On: 10/04/2015 22:24   EKG:  Orders placed or performed during the hospital encounter of 10/04/15  . ED EKG within 10 minutes  . ED EKG within 10 minutes    ASSESSMENT AND PLAN:  Principal Problem:   Acute on chronic systolic CHF (congestive heart failure) (HCC) #1. Acute on chronic systolic CHF, improved since admission, patient diuresed approximately 300 cc, continue oxygen therapy,, Lasix intravenously daily, follow urinary output, renal function, ins and outs, echocardiogram was performed, pending results. Continue Coreg, Lasix, may initiate lisinopril at bedtime if blood pressure is  stable #2. Renal insufficiency,, follow with diuresis, possibly chronic #3. Right upper quadrant abdominal pain, concerning for cholecystitis, elevated LFTs, get right upper quadrant ultrasound, get surgical consultation if needed #4 anemia, get Hemoccult #5. Hyperglycemia, get hemoglobin A1c to rule out diabetes   Management plans discussed with the patient, family and they are in agreement.   DRUG ALLERGIES: No Known Allergies  CODE STATUS:     Code Status Orders        Start     Ordered   10/05/15 0228  Full code  Continuous     10/05/15 0227    Code Status History    Date Active Date Inactive Code Status Order ID Comments User Context   10/05/2015  2:27 AM 10/05/2015  7:45 AM Full Code [id number]  Oralia Manis, MD Inpatient      TOTAL TIME TAKING CARE OF THIS PATIENT: 40 minutes.    Katharina Caper M.D on 10/05/2015 at 11:28 AM  Between 7am to 6pm - Pager - [phone]  After 6pm go to [email or web address] - password [email or web address] [facility]  [place] [facility]  Office  [phone]  CC: Primary care physician; No PCP Per Patient

## 2015-10-05 NOTE — Progress Notes (Signed)
*  PRELIMINARY RESULTS* Echocardiogram 2D Echocardiogram has been performed.  Cristela Blue 10/05/2015, 8:13 AM

## 2015-10-06 ENCOUNTER — Observation Stay: Payer: Self-pay

## 2015-10-06 DIAGNOSIS — N289 Disorder of kidney and ureter, unspecified: Secondary | ICD-10-CM

## 2015-10-06 DIAGNOSIS — R739 Hyperglycemia, unspecified: Secondary | ICD-10-CM

## 2015-10-06 DIAGNOSIS — R1013 Epigastric pain: Secondary | ICD-10-CM

## 2015-10-06 DIAGNOSIS — I340 Nonrheumatic mitral (valve) insufficiency: Secondary | ICD-10-CM

## 2015-10-06 DIAGNOSIS — R740 Nonspecific elevation of levels of transaminase and lactic acid dehydrogenase [LDH]: Secondary | ICD-10-CM

## 2015-10-06 DIAGNOSIS — I420 Dilated cardiomyopathy: Secondary | ICD-10-CM

## 2015-10-06 DIAGNOSIS — R7401 Elevation of levels of liver transaminase levels: Secondary | ICD-10-CM

## 2015-10-06 HISTORY — DX: Elevation of levels of liver transaminase levels: R74.01

## 2015-10-06 HISTORY — DX: Disorder of kidney and ureter, unspecified: N28.9

## 2015-10-06 HISTORY — DX: Nonrheumatic mitral (valve) insufficiency: I34.0

## 2015-10-06 HISTORY — DX: Dilated cardiomyopathy: I42.0

## 2015-10-06 HISTORY — DX: Hyperglycemia, unspecified: R73.9

## 2015-10-06 HISTORY — DX: Epigastric pain: R10.13

## 2015-10-06 LAB — COMPREHENSIVE METABOLIC PANEL
ALBUMIN: 3 g/dL — AB (ref 3.5–5.0)
ALT: 44 U/L (ref 14–54)
ANION GAP: 7 (ref 5–15)
AST: 36 U/L (ref 15–41)
Alkaline Phosphatase: 123 U/L (ref 38–126)
BILIRUBIN TOTAL: 0.7 mg/dL (ref 0.3–1.2)
BUN: 24 mg/dL — ABNORMAL HIGH (ref 6–20)
CALCIUM: 9.1 mg/dL (ref 8.9–10.3)
CO2: 29 mmol/L (ref 22–32)
Chloride: 101 mmol/L (ref 101–111)
Creatinine, Ser: 1 mg/dL (ref 0.44–1.00)
GFR, EST NON AFRICAN AMERICAN: 59 mL/min — AB (ref >60)
Glucose, Bld: 98 mg/dL (ref 65–99)
POTASSIUM: 4.5 mmol/L (ref 3.5–5.1)
Sodium: 137 mmol/L (ref 135–145)
TOTAL PROTEIN: 7.1 g/dL (ref 6.5–8.1)

## 2015-10-06 LAB — LIPASE, BLOOD: Lipase: 35 U/L (ref 11–51)

## 2015-10-06 MED ORDER — CARVEDILOL 6.25 MG PO TABS: 6.2500 mg | ORAL_TABLET | Freq: Two times a day (BID) | ORAL | 5 refills | Status: DC

## 2015-10-06 MED ORDER — LISINOPRIL 5 MG PO TABS: 2.5000 mg | ORAL_TABLET | Freq: Every day | ORAL | Status: DC

## 2015-10-06 MED ORDER — LEVOFLOXACIN 500 MG PO TABS
500.0000 mg | ORAL_TABLET | Freq: Every day | ORAL | Status: DC
  Administered 2015-10-06: 500 mg via ORAL
  Filled 2015-10-06: qty 1

## 2015-10-06 MED ORDER — PANTOPRAZOLE SODIUM 40 MG PO TBEC: 40.0000 mg | DELAYED_RELEASE_TABLET | Freq: Every day | ORAL | 3 refills | Status: DC

## 2015-10-06 MED ORDER — FUROSEMIDE 40 MG PO TABS: 40.0000 mg | ORAL_TABLET | Freq: Every day | ORAL | 6 refills | Status: DC

## 2015-10-06 MED ORDER — CARVEDILOL 6.25 MG PO TABS: 6.2500 mg | ORAL_TABLET | Freq: Two times a day (BID) | ORAL | Status: DC

## 2015-10-06 MED ORDER — LISINOPRIL 2.5 MG PO TABS: 2.5000 mg | ORAL_TABLET | Freq: Every day | ORAL | 5 refills | Status: DC

## 2015-10-06 MED ORDER — CARVEDILOL 3.125 MG PO TABS: 3.1250 mg | ORAL_TABLET | Freq: Once | ORAL | Status: DC

## 2015-10-06 MED ORDER — POTASSIUM CHLORIDE CRYS ER 20 MEQ PO TBCR: 20.0000 meq | EXTENDED_RELEASE_TABLET | ORAL | 3 refills | Status: DC

## 2015-10-06 NOTE — Discharge Summary (Signed)
[facility] [place] at [facility]   PATIENT NAME: Barbara Oliver    MR#:  [id number]  DATE OF BIRTH:  [birth year]  DATE OF ADMISSION:  10/04/2015 ADMITTING PHYSICIAN: Oralia Manis, MD  DATE OF DISCHARGE: No discharge date for patient encounter.  PRIMARY CARE PHYSICIAN: No PCP Per Patient     ADMISSION DIAGNOSIS:  Acute on chronic congestive heart failure, unspecified congestive heart failure type (HCC) [I50.9]  DISCHARGE DIAGNOSIS:  Principal Problem:   Acute on chronic systolic CHF (congestive heart failure) (HCC) Active Problems:   Abdominal pain, epigastric   Elevated transaminase level   Acute renal insufficiency   Severe mitral regurgitation   Congestive dilated cardiomyopathy (HCC)   Hyperglycemia   SECONDARY DIAGNOSIS:   Past Medical History:  Diagnosis Date  . CHF (congestive heart failure) (HCC)     .pro HOSPITAL COURSE:  The patient is a 65 year old American female with asthma, history significant for history of congestive heart failure, tobacco abuse, who presents to the hospital with complaints of shortness of breath, sudden onset. On arrival to the hospital patient was tachypneic, her labs revealed elevated BNP, mild anemia, hyperglycemia. Patient's chest x-ray revealed mild cardiomegaly and congestive heart failure changes, mild interstitial edema. She admitted of some cough, dark phlegm production, but no wheezing, patient was initiated on diuretics, diuresed approximately 1 L since admission to the hospital and improved, she was weaned off oxygen and her O2 sats remained stable , however, she still admitted of some shortness of breath, dyspnea on exertion. The patient was seen by cardiologist, Dr. Juliann Pares who recommended to advance her Coreg to 6.25 mg twice daily dose. Her diuretic was also advised to 40 mg once daily dose, and lisinopril was added. On arrival to the hospital patient was noted to be in acute renal insufficiency with  creatinine level of 1.04, with diuresis her creatinine improved to 1.00 by the day of discharge. Patient was also complaining of epigastric abdominal pain, since her LFTs were slightly elevated, right upper quadrant ultrasound was performed, which was unremarkable. LFTs normalized with diuresis. Patient continues to have epigastric abdominal pain, it was felt to be gastritis, PPI was advanced to 40 mg once daily dose. Patient was advised to follow-up with primary care physician and make decisions about evaluation of epigastric abdominal pain depending on the needs, symptoms.  Discussion by problem: #1. Acute on chronic systolic CHF, improved since admission, patient diuresed approximately 1000 cc, weaning off oxygen therapy, check O2 sats on room air on exertion, a continue Lasix at advanced 40 mg once daily dose, she is to continue advanced doses of Coreg, lisinopril was added to medication regimen, echocardiogram performed during this admission revealed ejection fraction of 20-25%, severe mitral regurgitation . There is recommended to follow up with cardiologist, advance lisinopril as blood pressure tolerates.  #2. Renal insufficiency, acute,improved with  diuresis,, likely cardiorenal syndrome, follow as outpatient, especially with high doses of Lasix and new medication lisinopril  #3.Epigastric  abdominal pain, likely gastritis, right upper quadrant ultrasound was unremarkable,  LFTs have improved  , advance her Protonix to 40 mg daily dose, follow-up with primary care physician for further recommendations, patient may benefit from gastroenterologist evaluation as outpatient.  #4 anemia, Hemoccult ordered, not obtained while in the hospital, hemoglobin level remained stable, follow-up as outpatient   #5. Hyperglycemia, hemoglobin A1c was 5.9, no  diabetes   DISCHARGE CONDITIONS:   Stable  CONSULTS OBTAINED:    DRUG ALLERGIES:  No Known Allergies  DISCHARGE MEDICATIONS:   Current Discharge  Medication List    START taking these medications   Details  lisinopril (PRINIVIL,ZESTRIL) 2.5 MG tablet Take 1 tablet (2.5 mg total) by mouth daily. Qty: 30 tablet, Refills: 5    pantoprazole (PROTONIX) 40 MG tablet Take 1 tablet (40 mg total) by mouth daily. Switch for any other PPI at similar dose and frequency Qty: 30 tablet, Refills: 3    potassium chloride SA (K-DUR,KLOR-CON) 20 MEQ tablet Take 1 tablet (20 mEq total) by mouth every other day. Qty: 30 tablet, Refills: 3      CONTINUE these medications which have CHANGED   Details  carvedilol (COREG) 6.25 MG tablet Take 1 tablet (6.25 mg total) by mouth 2 (two) times daily. Qty: 60 tablet, Refills: 5    furosemide (LASIX) 40 MG tablet Take 1 tablet (40 mg total) by mouth daily. Qty: 30 tablet, Refills: 6      STOP taking these medications     omeprazole (PRILOSEC) 20 MG capsule          DISCHARGE INSTRUCTIONS:    Patient is to follow-up with primary care physician, cardiologist as outpatient  If you experience worsening of your admission symptoms, develop shortness of breath, life threatening emergency, suicidal or homicidal thoughts you must seek medical attention immediately by calling 911 or calling your MD immediately  if symptoms less severe.  You Must read complete instructions/literature along with all the possible adverse reactions/side effects for all the Medicines you take and that have been prescribed to you. Take any new Medicines after you have completely understood and accept all the possible adverse reactions/side effects.   Please note  You were cared for by a hospitalist during your hospital stay. If you have any questions about your discharge medications or the care you received while you were in the hospital after you are discharged, you can call the unit and asked to speak with the hospitalist on call if the hospitalist that took care of you is not available. Once you are discharged, your primary care  physician will handle any further medical issues. Please note that NO REFILLS for any discharge medications will be authorized once you are discharged, as it is imperative that you return to your primary care physician (or establish a relationship with a primary care physician if you do not have one) for your aftercare needs so that they can reassess your need for medications and monitor your lab values.    Today   CHIEF COMPLAINT:   Chief Complaint  Patient presents with  . Shortness of Breath    HISTORY OF PRESENT ILLNESS:  Barbara Oliver  is a 65 y.o. female with a known history of congestive heart failure, tobacco abuse, who presents to the hospital with complaints of shortness of breath, sudden onset. On arrival to the hospital patient was tachypneic, her labs revealed elevated BNP, mild anemia, hyperglycemia. Patient's chest x-ray revealed mild cardiomegaly and congestive heart failure changes, mild interstitial edema. She admitted of some cough, dark phlegm production, but no wheezing, patient was initiated on diuretics, diuresed approximately 1 L since admission to the hospital and improved, she was weaned off oxygen and her O2 sats remained stable , however, she still admitted of some shortness of breath, dyspnea on exertion. The patient was seen by cardiologist, Dr. Juliann Pares who recommended to advance her Coreg to 6.25 mg twice daily dose. Her diuretic was also advised to 40 mg once daily dose, and lisinopril was added. On  arrival to the hospital patient was noted to be in acute renal insufficiency with creatinine level of 1.04, with diuresis her creatinine improved to 1.00 by the day of discharge. Patient was also complaining of epigastric abdominal pain, since her LFTs were slightly elevated, right upper quadrant ultrasound was performed, which was unremarkable. LFTs normalized with diuresis. Patient continues to have epigastric abdominal pain, it was felt to be gastritis, PPI was advanced  to 40 mg once daily dose. Patient was advised to follow-up with primary care physician and make decisions about evaluation of epigastric abdominal pain depending on the needs, symptoms.  Discussion by problem: #1. Acute on chronic systolic CHF, improved since admission, patient diuresed approximately 1000 cc, weaning off oxygen therapy, check O2 sats on room air on exertion, a continue Lasix at advanced 40 mg once daily dose, she is to continue advanced doses of Coreg, lisinopril was added to medication regimen, echocardiogram performed during this admission revealed ejection fraction of 20-25%, severe mitral regurgitation . There is recommended to follow up with cardiologist, advance lisinopril as blood pressure tolerates.  #2. Renal insufficiency, acute,improved with  diuresis,, likely cardiorenal syndrome, follow as outpatient, especially with high doses of Lasix and new medication lisinopril  #3.Epigastric  abdominal pain, likely gastritis, right upper quadrant ultrasound was unremarkable,  LFTs have improved  , advance her Protonix to 40 mg daily dose, follow-up with primary care physician for further recommendations, patient may benefit from gastroenterologist evaluation as outpatient.  #4 anemia, Hemoccult ordered, not obtained while in the hospital, hemoglobin level remained stable, follow-up as outpatient   #5. Hyperglycemia, hemoglobin A1c was 5.9, no  diabetes    VITAL SIGNS:  Blood pressure 126/78, pulse 84, temperature 98.3 F (36.8 C), resp. rate 16, height 6\' 1"  (1.854 m), weight 71 kg (156 lb 9.6 oz), SpO2 99 %.  I/O:   Intake/Output Summary (Last 24 hours) at 10/06/15 1044 Last data filed at 10/06/15 0920  Gross per 24 hour  Intake              240 ml  Output             1000 ml  Net             -760 ml    PHYSICAL EXAMINATION:  GENERAL:  65 y.o.-year-old patient lying in the bed with no acute distress.  EYES: Pupils equal, round, reactive to light and accommodation. No  scleral icterus. Extraocular muscles intact.  HEENT: Head atraumatic, normocephalic. Oropharynx and nasopharynx clear.  NECK:  Supple, no jugular venous distention. No thyroid enlargement, no tenderness.  LUNGS: Normal breath sounds bilaterally, no wheezing, rales,rhonchi or crepitation. No use of accessory muscles of respiration.  CARDIOVASCULAR: S1, S2 normal. No murmurs, rubs, or gallops.  ABDOMEN: Soft, non-tender, non-distended. Bowel sounds present. No organomegaly or mass.  EXTREMITIES: No pedal edema, cyanosis, or clubbing.  NEUROLOGIC: Cranial nerves II through XII are intact. Muscle strength 5/5 in all extremities. Sensation intact. Gait not checked.  PSYCHIATRIC: The patient is alert and oriented x 3.  SKIN: No obvious rash, lesion, or ulcer.   DATA REVIEW:   CBC  Recent Labs Lab 10/05/15 0451  WBC 7.1  HGB 11.2*  HCT 34.0*  PLT 287    Chemistries   Recent Labs Lab 10/06/15 0521  NA 137  K 4.5  CL 101  CO2 29  GLUCOSE 98  BUN 24*  CREATININE 1.00  CALCIUM 9.1  AST 36  ALT 44  ALKPHOS 123  BILITOT 0.7    Cardiac Enzymes  Recent Labs Lab 10/05/15 1557  TROPONINI <0.03    Microbiology Results  No results found for this or any previous visit.  RADIOLOGY:  Dg Chest 2 View  Result Date: 10/04/2015 CLINICAL DATA:  65 year old female with shortness of breath EXAM: CHEST  2 VIEW COMPARISON:  Chest radiograph dated 06/11/2015 FINDINGS: There is mild cardiomegaly with increased vascular and interstitial prominence compatible with congestive changes. Bilateral mid to lower lung fields hazy densities likely represent congestion. Pneumonia is not excluded. Clinical correlation is recommended. There are interstitial prominence with Kerley B-lines. There is no focal consolidation. No pleural effusion or pneumothorax. There is osteopenia with degenerative changes of spine. No acute osseous pathology. IMPRESSION: Stable mild cardiomegaly with congestive changes and  mild interstitial edema. Superimposed pneumonia is not excluded. Clinical correlation is recommended. Electronically Signed   By: Elgie Collard M.D.   On: 10/04/2015 22:24  US Abdomen Limited Ruq  Result Date: 10/06/2015 CLINICAL DATA:  Two months of right upper quadrant abdominal pain EXAM: US ABDOMEN LIMITED - RIGHT UPPER QUADRANT COMPARISON:  Abdominal series of September 30, 2013 FINDINGS: Gallbladder: No gallstones or wall thickening visualized. No sonographic Murphy sign noted by sonographer. Common bile duct: Diameter: 3.5 mm Liver: No focal lesion identified. Within normal limits in parenchymal echogenicity. IMPRESSION: Normal limited right upper quadrant ultrasound. If gallbladder dysfunction is suspected clinically, a nuclear medicine hepatobiliary scan may be useful. Electronically Signed   By: David  [place] M.D.   On: 10/06/2015 08:42   EKG:   Orders placed or performed during the hospital encounter of 10/04/15  . ED EKG within 10 minutes  . ED EKG within 10 minutes      Management plans discussed with the patient, family and they are in agreement.  CODE STATUS:     Code Status Orders        Start     Ordered   10/06/15 0809  Full code  Continuous     10/06/15 0808    Code Status History    Date Active Date Inactive Code Status Order ID Comments User Context   10/05/2015  2:27 AM 10/05/2015  7:45 AM Full Code [id number]  Oralia Manis, MD Inpatient      TOTAL TIME TAKING CARE OF THIS PATIENT: 40  minutes.    Katharina Caper M.D on 10/06/2015 at 10:44 AM  Between 7am to 6pm - Pager - [phone]  After 6pm go to [email or web address] - password [email or web address]  [facility]  Office  [phone]  CC: Primary care physician; No PCP Per Patient

## 2015-10-06 NOTE — Progress Notes (Signed)
Discharge instructions given. Patient educated on heart failure and medication changes. Printed prescriptions given to patient. IV and telemetry removed. Ride is on the way.

## 2015-10-06 NOTE — Progress Notes (Signed)
SATURATION QUALIFICATIONS: (This note is used to comply with regulatory documentation for home oxygen)  Patient Saturations on Room Air at Rest = 96%  Patient Saturations on Room Air while Ambulating = 98%  Patient Saturations on Liters of oxygen while Ambulating = N/A  Please briefly explain why patient needs home oxygen:

## 2015-10-18 ENCOUNTER — Encounter: Payer: Self-pay | Admitting: Family

## 2015-10-18 ENCOUNTER — Telehealth: Payer: Self-pay | Admitting: Family

## 2015-10-18 ENCOUNTER — Ambulatory Visit: Payer: Self-pay | Admitting: Family

## 2015-10-18 NOTE — Telephone Encounter (Signed)
Patient missed her initial appointment at the [facility] on 10/18/15. Will attempt to reschedule.

## 2015-12-12 DIAGNOSIS — H1033 Unspecified acute conjunctivitis, bilateral: Secondary | ICD-10-CM | POA: Insufficient documentation

## 2015-12-12 DIAGNOSIS — I5023 Acute on chronic systolic (congestive) heart failure: Secondary | ICD-10-CM | POA: Insufficient documentation

## 2015-12-12 DIAGNOSIS — F17210 Nicotine dependence, cigarettes, uncomplicated: Secondary | ICD-10-CM | POA: Insufficient documentation

## 2015-12-12 DIAGNOSIS — Z79899 Other long term (current) drug therapy: Secondary | ICD-10-CM | POA: Insufficient documentation

## 2015-12-12 DIAGNOSIS — J209 Acute bronchitis, unspecified: Secondary | ICD-10-CM | POA: Insufficient documentation

## 2015-12-12 NOTE — ED Triage Notes (Addendum)
Patient ambulatory to triage with steady gait, without difficulty or distress; pt with frequent rapid jerky movements noted; pt reports productive cough yellow sputum with bilat eye irritation and drainage; st "I was hoping I could just stay overnight so I could get some rest"

## 2015-12-13 ENCOUNTER — Emergency Department: Payer: Self-pay

## 2015-12-13 ENCOUNTER — Emergency Department
Admission: EM | Admit: 2015-12-13 | Discharge: 2015-12-13 | Disposition: A | Discharge status: Home / Self Care | Payer: Self-pay | Attending: Emergency Medicine | Admitting: Emergency Medicine

## 2015-12-13 DIAGNOSIS — J209 Acute bronchitis, unspecified: Secondary | ICD-10-CM

## 2015-12-13 DIAGNOSIS — H1033 Unspecified acute conjunctivitis, bilateral: Secondary | ICD-10-CM

## 2015-12-13 MED ORDER — AZITHROMYCIN 500 MG PO TABS
500.0000 mg | ORAL_TABLET | Freq: Once | ORAL | Status: AC
  Administered 2015-12-13: 500 mg via ORAL
  Filled 2015-12-13: qty 1

## 2015-12-13 MED ORDER — AZITHROMYCIN 500 MG PO TABS: 500.0000 mg | ORAL_TABLET | Freq: Every day | ORAL | 0 refills | Status: AC

## 2015-12-13 MED ORDER — ERYTHROMYCIN 5 MG/GM OP OINT
1.0000 | TOPICAL_OINTMENT | Freq: Three times a day (TID) | OPHTHALMIC | 0 refills | Status: AC
Start: 2015-12-13 — End: 2015-12-18

## 2015-12-13 MED ORDER — ERYTHROMYCIN 5 MG/GM OP OINT
TOPICAL_OINTMENT | Freq: Once | OPHTHALMIC | Status: AC
  Administered 2015-12-13: 1 via OPHTHALMIC
  Filled 2015-12-13: qty 1

## 2015-12-13 NOTE — ED Notes (Signed)
Pt discharged to home.  Family member driving.  Discharge instructions reviewed.  Verbalized understanding.  No questions or concerns at this time.  Teach back verified.  Pt in NAD.  No items left in ED.   

## 2015-12-13 NOTE — ED Provider Notes (Signed)
[facility] Emergency Department Provider Note    First MD Initiated Contact with Patient 12/13/15 0201     (approximate)  I have reviewed the triage vital signs and the nursing notes.   HISTORY  Chief Complaint Cough   HPI Barbara Oliver is a 65 y.o. female with history of CHF presents with productive cough yellow sputum and bilateral eye drainage. Admits to daily tobacco use. Patient denies fever.    Past Medical History:  Diagnosis Date  . CHF (congestive heart failure) [facility](HCC)     Patient Active Problem List   Diagnosis Date Noted  . Abdominal pain, epigastric 10/06/2015  . Elevated transaminase level 10/06/2015  . Acute renal insufficiency 10/06/2015  . Hyperglycemia 10/06/2015  . Severe mitral regurgitation 10/06/2015  . 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 Acute on chronic systolic CHF (congestive heart failure) (HCC) 10/05/2015    Past Surgical History:  Procedure Laterality Date  . HERNIA REPAIR      Prior to Admission medications   Medication Sig Start Date End Date Taking? Authorizing Provider  carvedilol (COREG) 6.25 MG tablet Take 1 tablet (6.25 mg total) by mouth 2 (two) times daily. 10/06/15   Katharina Caperima [place], MD  furosemide (LASIX) 40 MG tablet Take 1 tablet (40 mg total) by mouth daily. 10/06/15   Katharina Caperima [place], MD  lisinopril (PRINIVIL,ZESTRIL) 2.5 MG tablet Take 1 tablet (2.5 mg total) by mouth daily. 10/06/15   Katharina Caperima [place], MD  pantoprazole (PROTONIX) 40 MG tablet Take 1 tablet (40 mg total) by mouth daily. Switch for any other PPI at similar dose and frequency 10/06/15   Katharina Caperima [place], MD  potassium chloride SA (K-DUR,KLOR-CON) 20 MEQ tablet Take 1 tablet (20 mEq total) by mouth every other day. 10/06/15   Katharina Caperima [place]ick[place], MD    Allergies No Known Drug Allergies  Family History  Problem Relation Age of Onset  . Hypertension Mother   . Heart attack Father     Social History Social History  Substance Use  Topics  . Smoking status: Oliver Some Day Smoker    Packs/day: 0.00    Types: Cigarettes  . Smokeless tobacco: Never Used  . Alcohol use No    Review of Systems Constitutional: No fever/chills Eyes: No visual changes. ENT: No sore throat. Cardiovascular: Denies chest pain. Respiratory: Denies shortness of breath. Gastrointestinal: No abdominal pain.  No nausea, no vomiting.  No diarrhea.  No constipation. Genitourinary: Negative for dysuria. Musculoskeletal: Negative for back pain. Skin: Negative for rash. Neurological: Negative for headaches, focal weakness or numbness.    10-point ROS otherwise negative.  ____________________________________________   PHYSICAL EXAM:  VITAL SIGNS: ED Triage Vitals  Enc Vitals Group     BP 12/13/15 0000 (!) 113/46     Pulse Rate 12/13/15 0000 89     Resp 12/13/15 0000 20     Temp 12/13/15 0000 98.2 F (36.8 C)     Temp Source 12/13/15 0000 Oral     SpO2 12/13/15 0000 96 %     Weight 12/12/15 2359 156 lb (70.8 kg)     Height 12/12/15 2359 6\' 1"  (1.854 m)     Head Circumference --      Peak Flow --      Pain Score --      Pain Loc --      Pain Edu? --      Excl. in GC? --     Constitutional: Alert and oriented. Well appearing and in no  acute distress. Eyes: Conjunctivae erythema with exudate. PERRL. EOMI. Head: Atraumatic. Ears:  Healthy appearing ear canals and TMs bilaterally Nose: No congestion/rhinnorhea. Mouth/Throat: Mucous membranes are moist.  Oropharynx non-erythematous. Neck: No stridor.  No meningeal signs.  Cardiovascular: Normal rate, regular rhythm. Good peripheral circulation. Grossly normal heart sounds. Respiratory: Normal respiratory effort.  No retractions. Lungs CTAB. Gastrointestinal: Soft and nontender. No distention.  Musculoskeletal: No lower extremity tenderness nor edema. No gross deformities of extremities. Neurologic:  Normal speech and language. No gross focal neurologic deficits are appreciated.    Skin:  Skin is warm, dry and intact. No rash noted. Psychiatric: Mood and affect are normal. Speech and behavior are normal.    Procedures    INITIAL IMPRESSION / ASSESSMENT AND PLAN / ED COURSE  Pertinent labs & imaging results that were available during my care of the patient were reviewed by me and considered in my medical decision making (see chart for details).     Clinical Course    ____________________________________________  FINAL CLINICAL IMPRESSION(S) / ED DIAGNOSES  Final diagnoses:  Acute conjunctivitis of both eyes, unspecified acute conjunctivitis type  Acute bronchitis, unspecified organism     MEDICATIONS GIVEN DURING THIS VISIT:  Medications - No data to display   NEW OUTPATIENT MEDICATIONS STARTED DURING THIS VISIT:  New Prescriptions   No medications on file    Modified Medications   No medications on file    Discontinued Medications   No medications on file     Note:  This document was prepared using Dragon voice recognition software and may include unintentional dictation errors.    Barbara Current, MD 12/13/15 [facility]

## 2016-01-24 IMAGING — CR DG CHEST 2V
1 series · 2 of 2 positions shown · non-contrast
Comparison: Prior radiograph from 10/31/2010

CLINICAL DATA: Initial evaluation for shortness of breath for 3
days.

EXAM:
CHEST  2 VIEW

[Series 1: dxr chest pa (or ap) and lateral · 0.14mm/px · 2 of 2 slices shown]
[im 1/2]
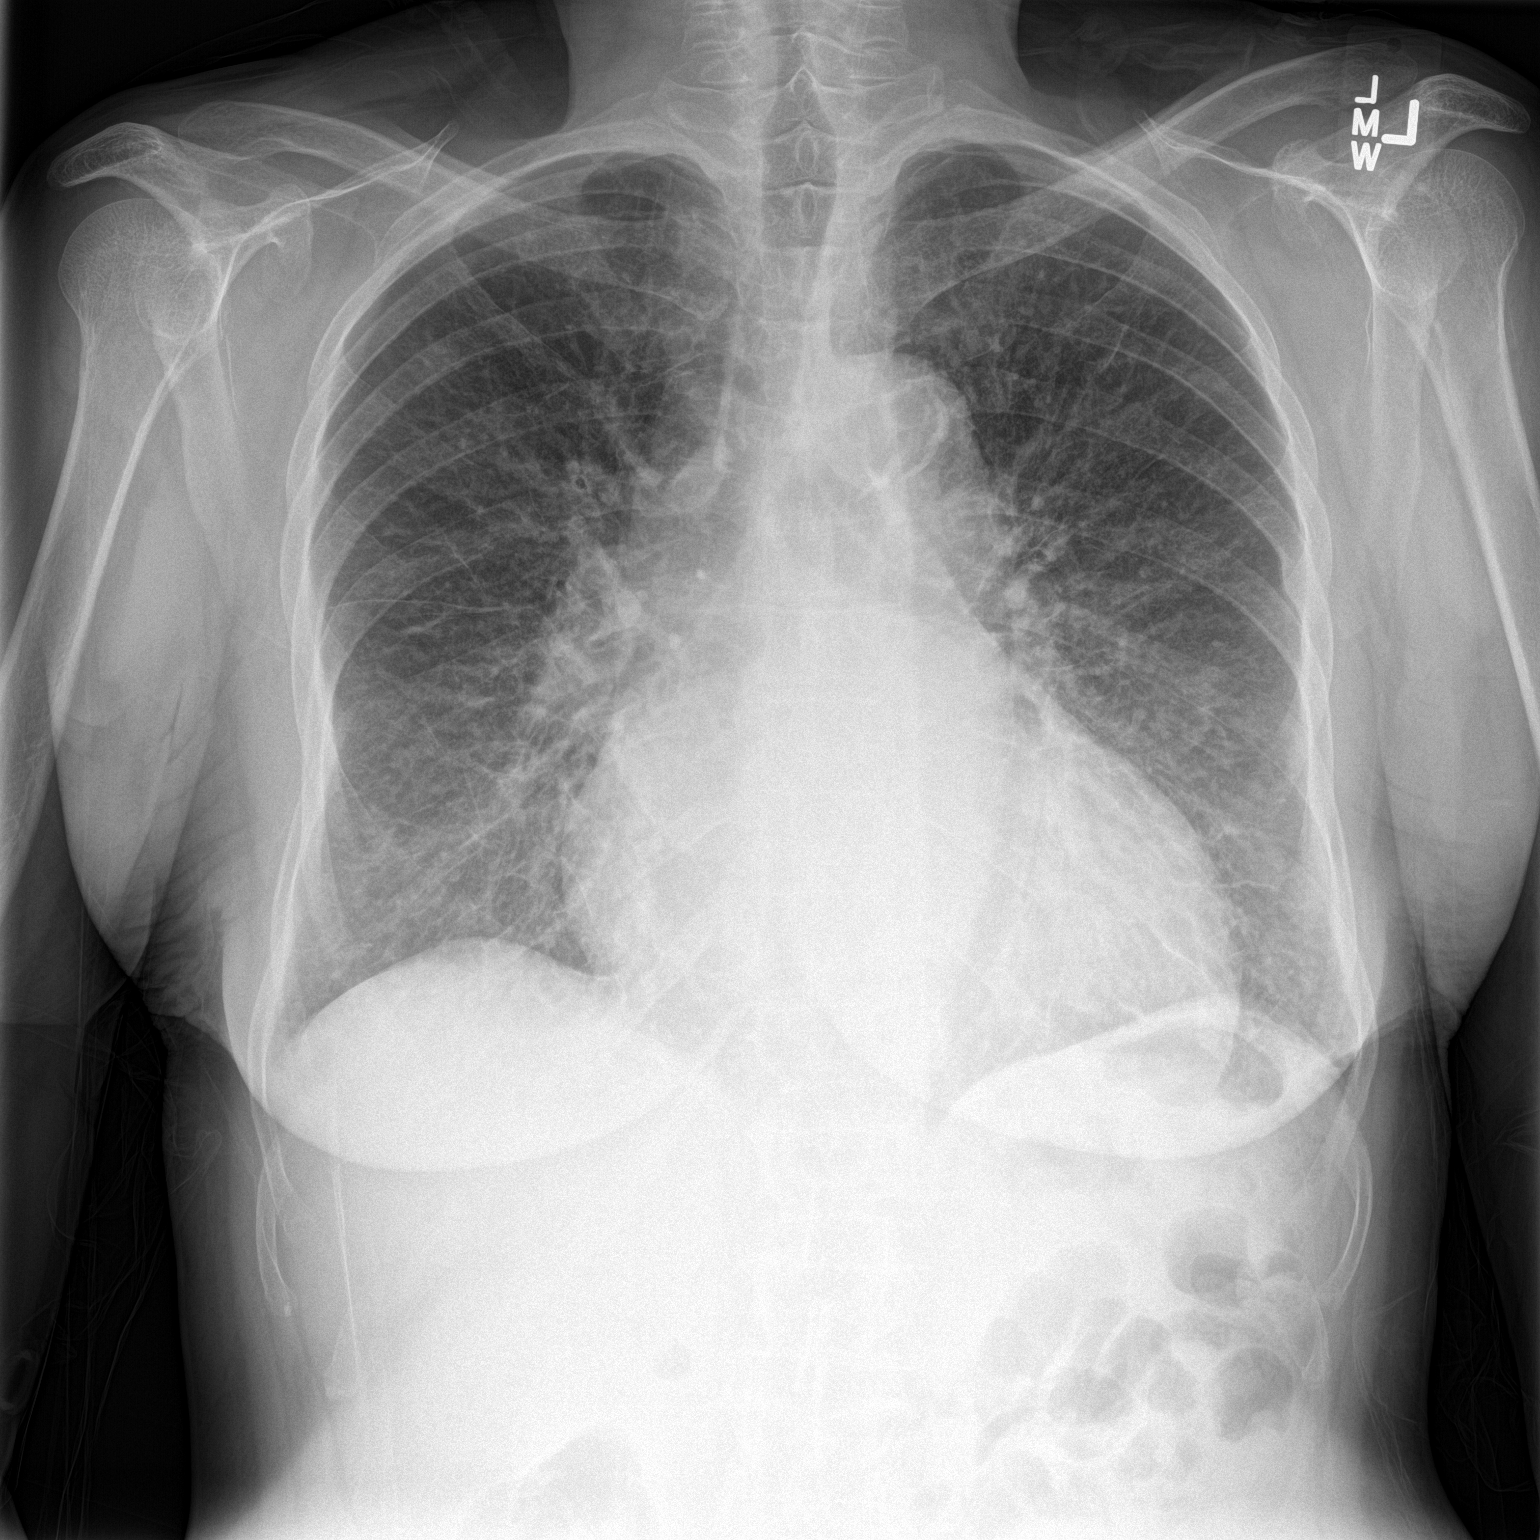
[im 2/2]
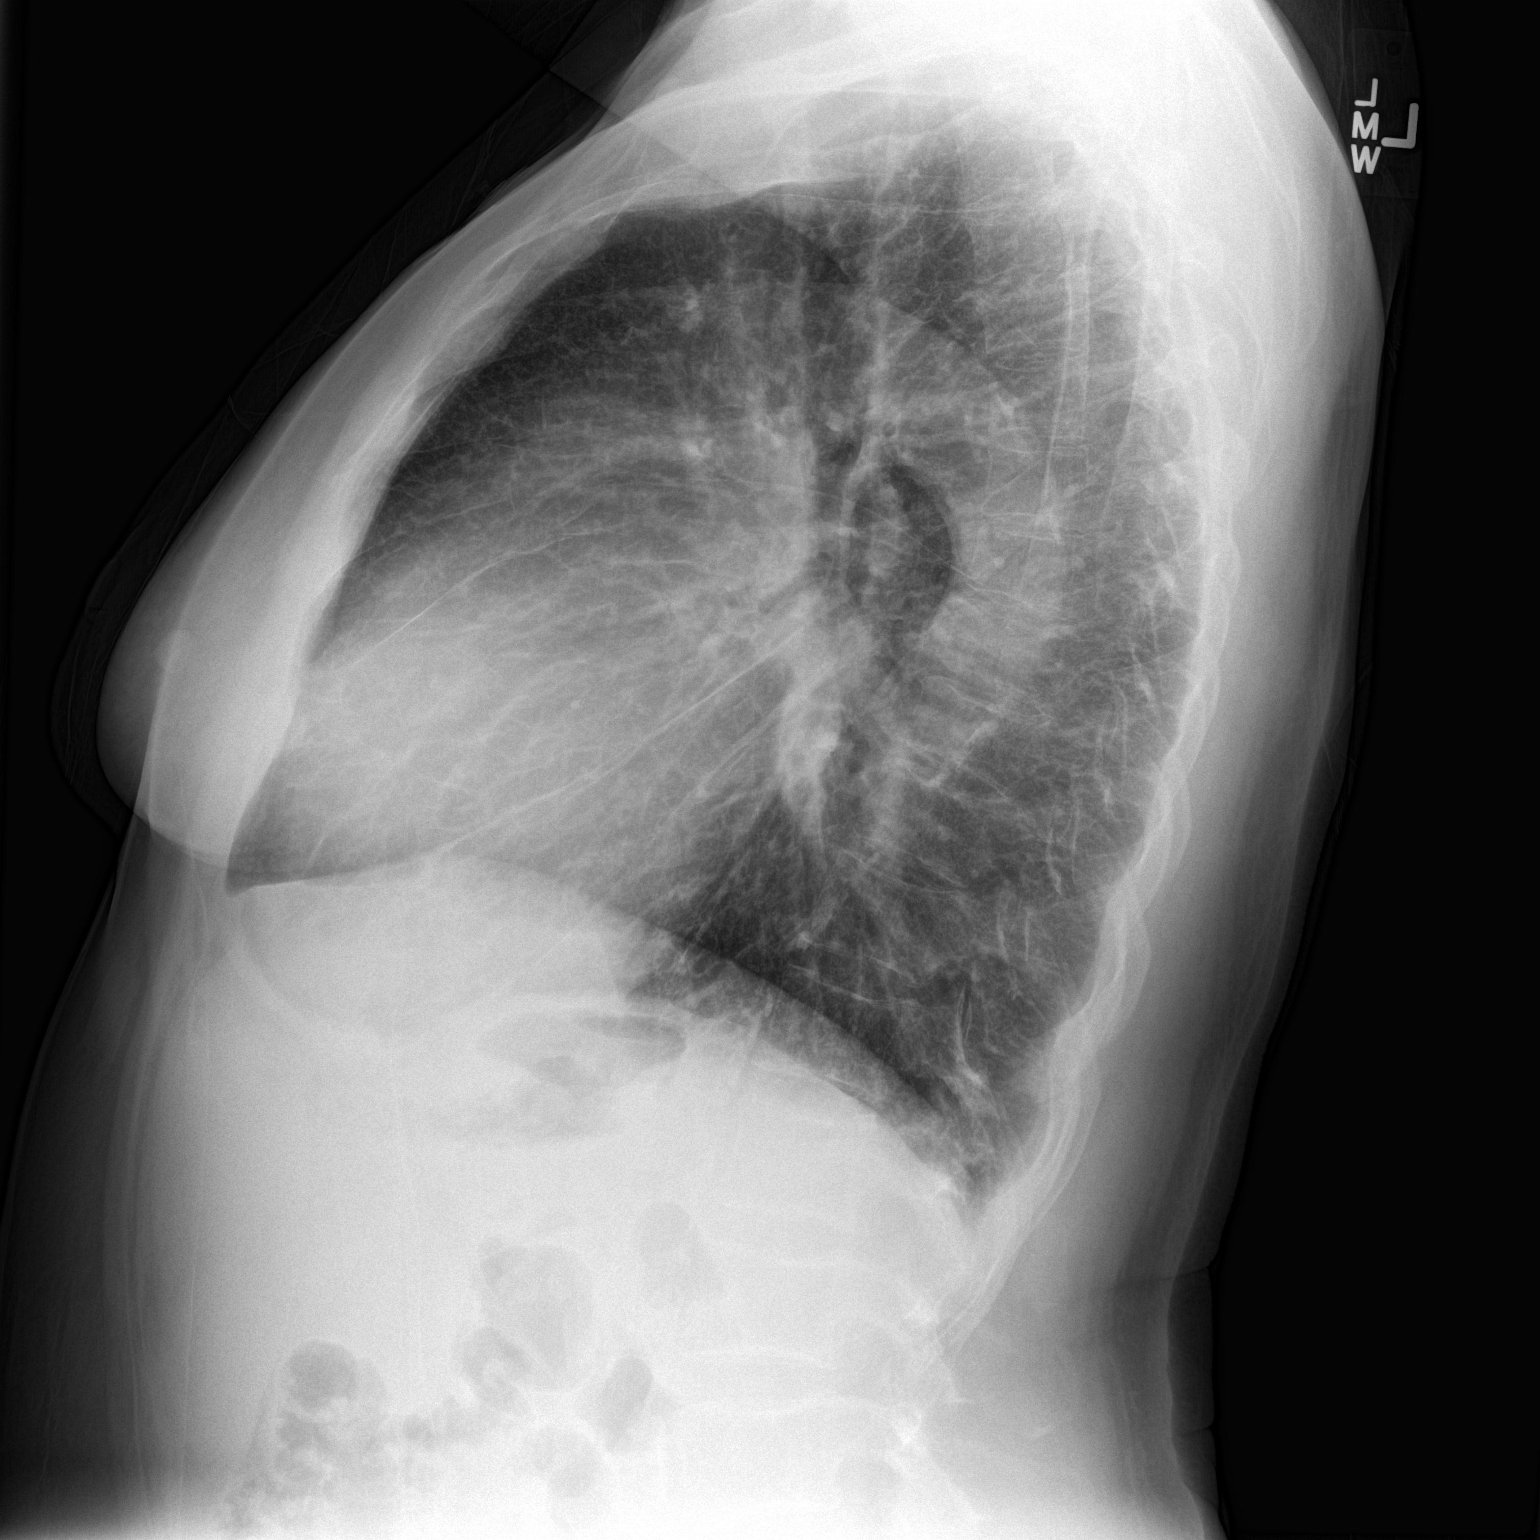

[2 of 2 positions shown; findings below may reference images not displayed]

FINDINGS: Moderate cardiomegaly is stable slightly increased relative to prior
study. Mediastinal silhouette within normal limits. Atherosclerotic
calcifications present within the aortic arch.

Lungs are normally inflated. There is diffuse vascular congestion
with peribronchial cuffing and interstitial prominence, most
consistent with moderate diffuse interstitial edema. No focal
infiltrates identified. No pleural effusion. No pneumothorax.

Multilevel degenerative changes are present within the visualized
spine. No acute osseus abnormality.
IMPRESSION: Cardiomegaly with findings most consistent with mild to moderate
diffuse interstitial pulmonary edema.

## 2016-03-07 ENCOUNTER — Emergency Department: Payer: Medicaid Other

## 2016-03-07 ENCOUNTER — Encounter: Payer: Self-pay | Admitting: *Deleted

## 2016-03-07 ENCOUNTER — Inpatient Hospital Stay
Admission: EM | Admit: 2016-03-07 | Discharge: 2016-03-11 | DRG: 291 | Disposition: A | Discharge status: Home / Self Care | Payer: Medicaid Other | Attending: Internal Medicine | Admitting: Internal Medicine

## 2016-03-07 DIAGNOSIS — I447 Left bundle-branch block, unspecified: Secondary | ICD-10-CM | POA: Diagnosis present

## 2016-03-07 DIAGNOSIS — I110 Hypertensive heart disease with heart failure: Secondary | ICD-10-CM | POA: Diagnosis present

## 2016-03-07 DIAGNOSIS — I340 Nonrheumatic mitral (valve) insufficiency: Secondary | ICD-10-CM | POA: Diagnosis present

## 2016-03-07 DIAGNOSIS — R05 Cough: Secondary | ICD-10-CM

## 2016-03-07 DIAGNOSIS — F17210 Nicotine dependence, cigarettes, uncomplicated: Secondary | ICD-10-CM | POA: Diagnosis present

## 2016-03-07 DIAGNOSIS — K219 Gastro-esophageal reflux disease without esophagitis: Secondary | ICD-10-CM | POA: Diagnosis present

## 2016-03-07 DIAGNOSIS — J810 Acute pulmonary edema: Secondary | ICD-10-CM

## 2016-03-07 DIAGNOSIS — R64 Cachexia: Secondary | ICD-10-CM | POA: Diagnosis present

## 2016-03-07 DIAGNOSIS — R0603 Acute respiratory distress: Secondary | ICD-10-CM | POA: Diagnosis present

## 2016-03-07 DIAGNOSIS — I509 Heart failure, unspecified: Secondary | ICD-10-CM

## 2016-03-07 DIAGNOSIS — E43 Unspecified severe protein-calorie malnutrition: Secondary | ICD-10-CM | POA: Insufficient documentation

## 2016-03-07 DIAGNOSIS — I420 Dilated cardiomyopathy: Secondary | ICD-10-CM | POA: Diagnosis present

## 2016-03-07 DIAGNOSIS — N179 Acute kidney failure, unspecified: Secondary | ICD-10-CM | POA: Diagnosis present

## 2016-03-07 DIAGNOSIS — R053 Chronic cough: Secondary | ICD-10-CM

## 2016-03-07 DIAGNOSIS — Z6822 Body mass index (BMI) 22.0-22.9, adult: Secondary | ICD-10-CM

## 2016-03-07 DIAGNOSIS — I5023 Acute on chronic systolic (congestive) heart failure: Secondary | ICD-10-CM | POA: Diagnosis present

## 2016-03-07 DIAGNOSIS — J069 Acute upper respiratory infection, unspecified: Secondary | ICD-10-CM | POA: Diagnosis present

## 2016-03-07 DIAGNOSIS — Z8249 Family history of ischemic heart disease and other diseases of the circulatory system: Secondary | ICD-10-CM | POA: Diagnosis not present

## 2016-03-07 DIAGNOSIS — J449 Chronic obstructive pulmonary disease, unspecified: Secondary | ICD-10-CM | POA: Diagnosis present

## 2016-03-07 DIAGNOSIS — M6281 Muscle weakness (generalized): Secondary | ICD-10-CM

## 2016-03-07 HISTORY — DX: Gastro-esophageal reflux disease without esophagitis: K21.9

## 2016-03-07 HISTORY — DX: Nonrheumatic mitral (valve) insufficiency: I34.0

## 2016-03-07 HISTORY — DX: Unspecified severe protein-calorie malnutrition: E43

## 2016-03-07 LAB — CBC WITH DIFFERENTIAL/PLATELET
Basophils Absolute: 0.1 10*3/uL (ref 0–0.1)
Basophils Relative: 1 %
Eosinophils Absolute: 0.2 10*3/uL (ref 0–0.7)
Eosinophils Relative: 2 %
HEMATOCRIT: 37.7 % (ref 35.0–47.0)
HEMOGLOBIN: 12 g/dL (ref 12.0–16.0)
LYMPHS ABS: 3.8 10*3/uL — AB (ref 1.0–3.6)
LYMPHS PCT: 39 %
MCH: 29.9 pg (ref 26.0–34.0)
MCHC: 32 g/dL (ref 32.0–36.0)
MCV: 93.4 fL (ref 80.0–100.0)
MONOS PCT: 9 %
Monocytes Absolute: 0.9 10*3/uL (ref 0.2–0.9)
NEUTROS PCT: 49 %
Neutro Abs: 4.7 10*3/uL (ref 1.4–6.5)
Platelets: 423 10*3/uL (ref 150–440)
RBC: 4.03 MIL/uL (ref 3.80–5.20)
RDW: 16.2 % — ABNORMAL HIGH (ref 11.5–14.5)
WBC: 9.8 10*3/uL (ref 3.6–11.0)

## 2016-03-07 LAB — COMPREHENSIVE METABOLIC PANEL
ALK PHOS: 167 U/L — AB (ref 38–126)
ALT: 38 U/L (ref 14–54)
AST: 73 U/L — ABNORMAL HIGH (ref 15–41)
Albumin: 3.2 g/dL — ABNORMAL LOW (ref 3.5–5.0)
Anion gap: 10 (ref 5–15)
BILIRUBIN TOTAL: 0.7 mg/dL (ref 0.3–1.2)
BUN: 30 mg/dL — ABNORMAL HIGH (ref 6–20)
CALCIUM: 8.9 mg/dL (ref 8.9–10.3)
CO2: 22 mmol/L (ref 22–32)
CREATININE: 1.48 mg/dL — AB (ref 0.44–1.00)
Chloride: 106 mmol/L (ref 101–111)
GFR, EST AFRICAN AMERICAN: 42 mL/min — AB (ref >60)
GFR, EST NON AFRICAN AMERICAN: 36 mL/min — AB (ref >60)
Glucose, Bld: 221 mg/dL — ABNORMAL HIGH (ref 65–99)
Potassium: 4.6 mmol/L (ref 3.5–5.1)
Sodium: 138 mmol/L (ref 135–145)
TOTAL PROTEIN: 7.7 g/dL (ref 6.5–8.1)

## 2016-03-07 LAB — TSH: TSH: 1.812 u[IU]/mL (ref 0.350–4.500)

## 2016-03-07 LAB — BLOOD GAS, ARTERIAL
Acid-base deficit: 9.2 mmol/L — ABNORMAL HIGH (ref 0.0–2.0)
BICARBONATE: 18.8 mmol/L — AB (ref 20.0–28.0)
DELIVERY SYSTEMS: POSITIVE
EXPIRATORY PAP: 5
FIO2: 1
Inspiratory PAP: 15
LHR: 8 {breaths}/min
O2 Saturation: 99.9 %
PATIENT TEMPERATURE: 37
pCO2 arterial: 48 mmHg (ref 32.0–48.0)
pH, Arterial: 7.2 — ABNORMAL LOW (ref 7.350–7.450)
pO2, Arterial: 383 mmHg — ABNORMAL HIGH (ref 83.0–108.0)

## 2016-03-07 LAB — TROPONIN I: TROPONIN I: 0.03 ng/mL — AB (ref <0.03)

## 2016-03-07 LAB — BRAIN NATRIURETIC PEPTIDE: B NATRIURETIC PEPTIDE 5: 2462 pg/mL — AB (ref 0.0–100.0)

## 2016-03-07 MED ORDER — TRAZODONE HCL 50 MG PO TABS: 25.0000 mg | ORAL_TABLET | Freq: Every evening | ORAL | Status: DC | PRN

## 2016-03-07 MED ORDER — ACETAMINOPHEN 325 MG PO TABS
650.0000 mg | ORAL_TABLET | Freq: Four times a day (QID) | ORAL | Status: DC | PRN
Start: 2016-03-07 — End: 2016-03-11

## 2016-03-07 MED ORDER — ONDANSETRON HCL 4 MG PO TABS: 4.0000 mg | ORAL_TABLET | Freq: Four times a day (QID) | ORAL | Status: DC | PRN

## 2016-03-07 MED ORDER — FUROSEMIDE 10 MG/ML IJ SOLN
40.0000 mg | Freq: Two times a day (BID) | INTRAMUSCULAR | Status: DC
  Administered 2016-03-07 – 2016-03-09 (×5): 40 mg via INTRAVENOUS
  Filled 2016-03-07 (×5): qty 4

## 2016-03-07 MED ORDER — DOCUSATE SODIUM 100 MG PO CAPS
100.0000 mg | ORAL_CAPSULE | Freq: Two times a day (BID) | ORAL | Status: DC
  Administered 2016-03-07 – 2016-03-11 (×9): 100 mg via ORAL
  Filled 2016-03-07 (×11): qty 1

## 2016-03-07 MED ORDER — FUROSEMIDE 10 MG/ML IJ SOLN
40.0000 mg | Freq: Once | INTRAMUSCULAR | Status: AC
  Administered 2016-03-07: 40 mg via INTRAVENOUS

## 2016-03-07 MED ORDER — BENZONATATE 100 MG PO CAPS
200.0000 mg | ORAL_CAPSULE | Freq: Two times a day (BID) | ORAL | Status: DC | PRN
  Administered 2016-03-08 – 2016-03-11 (×7): 200 mg via ORAL
  Filled 2016-03-07 (×7): qty 2

## 2016-03-07 MED ORDER — ACETAMINOPHEN 650 MG RE SUPP: 650.0000 mg | Freq: Four times a day (QID) | RECTAL | Status: DC | PRN

## 2016-03-07 MED ORDER — POTASSIUM CHLORIDE CRYS ER 20 MEQ PO TBCR
20.0000 meq | EXTENDED_RELEASE_TABLET | Freq: Every day | ORAL | Status: DC
  Administered 2016-03-07 – 2016-03-11 (×5): 20 meq via ORAL
  Filled 2016-03-07 (×5): qty 1

## 2016-03-07 MED ORDER — SODIUM CHLORIDE 0.9% FLUSH: 3.0000 mL | INTRAVENOUS | Status: DC | PRN

## 2016-03-07 MED ORDER — CARVEDILOL 6.25 MG PO TABS
6.2500 mg | ORAL_TABLET | Freq: Two times a day (BID) | ORAL | Status: DC
  Administered 2016-03-07 – 2016-03-11 (×9): 6.25 mg via ORAL
  Filled 2016-03-07 (×9): qty 1

## 2016-03-07 MED ORDER — GUAIFENESIN-DM 100-10 MG/5ML PO SYRP
5.0000 mL | ORAL_SOLUTION | ORAL | Status: DC | PRN
  Administered 2016-03-07 – 2016-03-10 (×6): 5 mL via ORAL
  Filled 2016-03-07 (×7): qty 5

## 2016-03-07 MED ORDER — SODIUM CHLORIDE 0.9% FLUSH
3.0000 mL | Freq: Two times a day (BID) | INTRAVENOUS | Status: DC
  Administered 2016-03-07 – 2016-03-10 (×7): 3 mL via INTRAVENOUS

## 2016-03-07 MED ORDER — SODIUM CHLORIDE 0.9% FLUSH
3.0000 mL | Freq: Two times a day (BID) | INTRAVENOUS | Status: DC
  Administered 2016-03-07 – 2016-03-11 (×7): 3 mL via INTRAVENOUS

## 2016-03-07 MED ORDER — SODIUM CHLORIDE 0.9 % IV SOLN: 250.0000 mL | INTRAVENOUS | Status: DC | PRN

## 2016-03-07 MED ORDER — LORAZEPAM 2 MG/ML IJ SOLN
1.0000 mg | Freq: Once | INTRAMUSCULAR | Status: AC
  Administered 2016-03-07: 1 mg via INTRAVENOUS

## 2016-03-07 MED ORDER — FUROSEMIDE 40 MG PO TABS: 40.0000 mg | ORAL_TABLET | Freq: Every day | ORAL | 6 refills | Status: DC

## 2016-03-07 MED ORDER — ASPIRIN EC 81 MG PO TBEC
81.0000 mg | DELAYED_RELEASE_TABLET | Freq: Every day | ORAL | Status: DC
  Administered 2016-03-07 – 2016-03-11 (×5): 81 mg via ORAL
  Filled 2016-03-07 (×5): qty 1

## 2016-03-07 MED ORDER — BISACODYL 5 MG PO TBEC: 5.0000 mg | DELAYED_RELEASE_TABLET | Freq: Every day | ORAL | Status: DC | PRN

## 2016-03-07 MED ORDER — LISINOPRIL 2.5 MG PO TABS: 2.5000 mg | ORAL_TABLET | Freq: Every day | ORAL | 0 refills | Status: DC

## 2016-03-07 MED ORDER — PANTOPRAZOLE SODIUM 40 MG PO TBEC: 40.0000 mg | DELAYED_RELEASE_TABLET | Freq: Every day | ORAL | 0 refills | Status: DC

## 2016-03-07 MED ORDER — ENSURE ENLIVE PO LIQD
237.0000 mL | Freq: Three times a day (TID) | ORAL | Status: DC
  Administered 2016-03-07 – 2016-03-11 (×12): 237 mL via ORAL

## 2016-03-07 MED ORDER — BENZONATATE 200 MG PO CAPS: 200.0000 mg | ORAL_CAPSULE | Freq: Two times a day (BID) | ORAL | 0 refills | Status: DC | PRN

## 2016-03-07 MED ORDER — ENALAPRILAT 1.25 MG/ML IV SOLN
1.2500 mg | Freq: Once | INTRAVENOUS | Status: AC
  Administered 2016-03-07: 1.25 mg via INTRAVENOUS

## 2016-03-07 MED ORDER — LISINOPRIL 5 MG PO TABS
2.5000 mg | ORAL_TABLET | Freq: Every day | ORAL | Status: DC
  Administered 2016-03-07 – 2016-03-08 (×2): 2.5 mg via ORAL
  Filled 2016-03-07 (×2): qty 1

## 2016-03-07 MED ORDER — GUAIFENESIN-DM 100-10 MG/5ML PO SYRP: 5.0000 mL | ORAL_SOLUTION | ORAL | 0 refills | Status: DC | PRN

## 2016-03-07 MED ORDER — LORAZEPAM 2 MG/ML IJ SOLN: 0.5000 mg | Freq: Once | INTRAMUSCULAR | Status: DC

## 2016-03-07 MED ORDER — ONDANSETRON HCL 4 MG/2ML IJ SOLN: 4.0000 mg | Freq: Four times a day (QID) | INTRAMUSCULAR | Status: DC | PRN

## 2016-03-07 MED ORDER — HEPARIN SODIUM (PORCINE) 5000 UNIT/ML IJ SOLN
5000.0000 [IU] | Freq: Three times a day (TID) | INTRAMUSCULAR | Status: DC
  Administered 2016-03-07 – 2016-03-11 (×12): 5000 [IU] via SUBCUTANEOUS
  Filled 2016-03-07 (×13): qty 1

## 2016-03-07 MED ORDER — CARVEDILOL 6.25 MG PO TABS: 6.2500 mg | ORAL_TABLET | Freq: Two times a day (BID) | ORAL | 5 refills | Status: DC

## 2016-03-07 NOTE — Progress Notes (Signed)
Initial [facility] appointment scheduled on March 14, 2016 at 11:30am. Of note, she did not show for a previously scheduled appointment back in August 2017. Thank you.

## 2016-03-07 NOTE — ED Triage Notes (Signed)
Pt presents w/ 3 day hx of not taking meds per EMS report. Pt told EMS she could not afford her lasix,. Pt in acute respiratory distress, diaphoretic, anxious, and laboring to breathe. Pt intially refused bipap, administered ativan to decrease anxiety, able to place bipap afterward.

## 2016-03-07 NOTE — Progress Notes (Signed)
Dr Sherryll Burger made aware of run of SVT called in by CCMD- no new orders at this time. Checked patient, patient sleeping in no acute distress. Will continue to monitor at this time.

## 2016-03-07 NOTE — Evaluation (Signed)
Physical Therapy Evaluation Patient Details Name: Barbara Oliver MRN: [id number] DOB: 21-Oct-1950 Today's Date: 03/07/2016   History of Present Illness  65 y/o female here with CHF, shortness of breath and prolonged coughing bouts.  She apparently had not been able to take her lasix the last few days  Clinical Impression  Pt with some coughing (prolonged bouts) t/o session but even with faded levels of O2 (from 4 down to 1 liter) she was able to maintain O2 sats 95% or higher.  She walked >200 ft and negotiated a flight of steps w/o safety concerns and though she had some fatigue she generally did well and reports not to be too far from her baseline except for the coughing and some fatigue.  Overall pt does not appear to have any PT needs and should be able to return home once medical issues are resolved.     Follow Up Recommendations No PT follow up    Equipment Recommendations       Recommendations for Other Services       Precautions / Restrictions Restrictions Weight Bearing Restrictions: No      Mobility  Bed Mobility Overal bed mobility: Independent             General bed mobility comments: Able to get to sitting w/o assist  Transfers Overall transfer level: Independent Equipment used: None             General transfer comment: Pt able to rise with no unsteadiness/safety issues  Ambulation/Gait Ambulation/Gait assistance: Modified independent (Device/Increase time) Ambulation Distance (Feet): 225 Feet Assistive device: None       General Gait Details: on 4 then 2 then 1 liter O2 during ambulation and she remained in the mid/upper 90s t/o the effort.  She did have one brief stagger step that she was able to self arrest and otherwise was safe and relatively confident.   Stairs Stairs: Yes Stairs assistance: Modified independent (Device/Increase time) Stair Management: One rail Right Number of Stairs: 12 General stair comments: Pt able to easily  negotiate up/down steps with reciprocal pattern  Wheelchair Mobility    Modified Rankin (Stroke Patients Only)       Balance Overall balance assessment: Independent                                           Pertinent Vitals/Pain      Home Living Family/patient expects to be discharged to:: Private residence Living Arrangements: Other relatives (brother)     Home Access: Stairs to enter Entrance Stairs-Rails: Right Entrance Stairs-Number of Steps: 4   Home Equipment: None      Prior Function Level of Independence: Independent         Comments: Pt reports she drives, runs errands, and generally is able to be active     Hand Dominance        Extremity/Trunk Assessment   Upper Extremity Assessment Upper Extremity Assessment: Overall WFL for tasks assessed    Lower Extremity Assessment Lower Extremity Assessment: Overall WFL for tasks assessed       Communication      Cognition Arousal/Alertness: Awake/alert Behavior During Therapy: WFL for tasks assessed/performed Overall Cognitive Status: Within Functional Limits for tasks assessed                 General Comments: Pt had an episode of uncontrolled coughing that lasted considerably  longer than one typically experiences (>2 minutes uninterrupted)    General Comments      Exercises     Assessment/Plan    PT Assessment Patent does not need any further PT services  PT Problem List            PT Treatment Interventions      PT Goals (Current goals can be found in the Care Plan section)  Acute Rehab PT Goals Patient Stated Goal: go home tomorrow    Frequency     Barriers to discharge        Co-evaluation               End of Session Equipment Utilized During Treatment: Oxygen (4 liters most of the time, she retained sats lower flow) Activity Tolerance: Patient tolerated treatment well Patient left: with bed alarm set;with call bell/phone within  reach Nurse Communication: Mobility status (coughing fit issues)         Time: 9767-3419 PT Time Calculation (min) (ACUTE ONLY): 25 min   Charges:   PT Evaluation $PT Eval Low Complexity: 1 Procedure     PT G CodesMalachi Pro, DPT 03/07/2016, 1:37 PM

## 2016-03-07 NOTE — Consult Note (Signed)
[facility] CLINIC CARDIOLOGY [facility]  CARDIOLOGY CONSULT NOTE  Patient ID: Barbara Oliver MRN: [id number] DOB/AGE: [birth year] 64 y.o.  Admit date: 03/07/2016 Referring Physician Dr. Karlene Lineman Primary Physician  Primary Cardiologist Dr. Shon Hale, [facility] Cardiology Reason for Consultation chf  HPI: Patient is a 65 year old female with history of cardiomyopathy with an EF of 20-25% followed at [facility]. She has a history of chronic left bundle-branch block. Cardiac MRI revealed no ischemia. Echocardiogram showed moderate MR and EF of 20% with right ventricular systolic pressure 43 mmHg. He had a recently impaired LV with dyskinesis of the septum and akinesis of the inferior wall with hypocontractile otherwise. Left atrium is severely enlarged. She has been considered for cardiac resynchronization given her persistent ejection fraction of 20% with moderate to severe MR, consideration for this device could still be raised. Breath after being out of her furosemide for 3-4 days due to financial issues. She normally takes 40 mg daily along with spironolactone at 12.5 mg daily and carvedilol at 6.25 mg twice daily. Is unclear whether she has been compliant with the remainder of her medications either. She has been restarted on her diuretics and continue with her beta blockers and spironolactone. She appears to be back to her baseline is laying flat in bed without shortness of breath. Chest x-ray on presentation revealed pulmonary vascular congestion. Electrocardiogram revealed sinus rhythm with left bundle branch block. Her serum troponin is unremarkable.  Review of Systems  Constitutional: Negative.   HENT: Negative.   Eyes: Negative.   Respiratory: Positive for shortness of breath.   Cardiovascular: Positive for leg swelling.  Gastrointestinal: Negative.   Genitourinary: Negative.   Musculoskeletal: Negative.   Skin: Negative.   Neurological: Negative.    Endo/Heme/Allergies: Negative.   Psychiatric/Behavioral: Negative.     Past Medical History:  Diagnosis Date  . Acid reflux   . CHF (congestive heart failure) (HCC)   . Mitral regurgitation     Family History  Problem Relation Age of Onset  . Hypertension Mother   . Heart attack Father     Social History   Social History  . Marital status: Married    Spouse name: N/A  . Number of children: N/A  . Years of education: N/A   Occupational History  . Not on file.   Social History Main Topics  . Smoking status: Current Some Day Smoker    Packs/day: 0.00    Types: Cigarettes  . Smokeless tobacco: Never Used  . Alcohol use No  . Drug use: No  . Sexual activity: Not on file   Other Topics Concern  . Not on file   Social History Narrative  . No narrative on file    Past Surgical History:  Procedure Laterality Date  . HERNIA REPAIR       Facility-Administered Medications Prior to Admission  Medication Dose Route Frequency Provider Last Rate Last Dose  . lisinopril (PRINIVIL,ZESTRIL) tablet 2.5 mg  2.5 mg Oral Daily Katharina Caper, MD       Prescriptions Prior to Admission  Medication Sig Dispense Refill Last Dose  . carvedilol (COREG) 6.25 MG tablet Take 1 tablet (6.25 mg total) by mouth 2 (two) times daily. 60 tablet 5 unknown at unknown  . furosemide (LASIX) 40 MG tablet Take 1 tablet (40 mg total) by mouth daily. 30 tablet 6 unknown at unknown  . lisinopril (PRINIVIL,ZESTRIL) 2.5 MG tablet Take 1 tablet (2.5 mg total) by mouth daily. 30 tablet  5 unknown at unknown  . pantoprazole (PROTONIX) 40 MG tablet Take 1 tablet (40 mg total) by mouth daily. Switch for any other PPI at similar dose and frequency 30 tablet 3 unknown at unknown  . potassium chloride SA (K-DUR,KLOR-CON) 20 MEQ tablet Take 1 tablet (20 mEq total) by mouth every other day. 30 tablet 3 unknown at unknown    Physical Exam: Blood pressure 136/76, pulse 92, temperature 98.3 F (36.8 C), resp. rate  18, height 5\' 10"  (1.778 m), weight 154 lb 3.2 oz (69.9 kg), SpO2 99 %.   Wt Readings from Last 1 Encounters:  03/07/16 154 lb 3.2 oz (69.9 kg)     General appearance: alert and cooperative Resp: rales bibasilar Cardio: regular rate and rhythm GI: soft, non-tender; bowel sounds normal; no masses,  no organomegaly Extremities: edema 2+ lower extremity edema Neurologic: Grossly normal  Labs:   Lab Results  Component Value Date   WBC 9.8 03/07/2016   HGB 12.0 03/07/2016   HCT 37.7 03/07/2016   MCV 93.4 03/07/2016   PLT 423 03/07/2016    Recent Labs Lab 03/07/16 0507  NA 138  K 4.6  CL 106  CO2 22  BUN 30*  CREATININE 1.48*  CALCIUM 8.9  PROT 7.7  BILITOT 0.7  ALKPHOS 167*  ALT 38  AST 73*  GLUCOSE 221*   Lab Results  Component Value Date   TROPONINI 0.03 (HH) 03/07/2016      Radiology: Mild interstitial edema with cardiomegaly  EKG: Sinus rhythm with left bundle branch block.  ASSESSMENT AND PLAN:  65 year old female with history of chronic left bundle branch block and dilated  cardiomyopathy  cardiac MRI showing no active ischemia. She is being considered for cardiac resynchronization is not procedure is as yet been carried out. She was admitted after developing increased shortness of breath after being off of her diuretics and possibly other medications for at least 3 days. She has improved since resuming her medications. We'll continue with carvedilol at 6.25 mg twice daily, resume furosemide 40 mg daily, spironolactone 12.5 mg daily. Ambulating consider [facility]. We'll need financial counseling to assist with medication compliance. Signed: Dalia HeadingKenneth A Belle Charlie MD, [facility] 03/07/2016, 2:01 PM

## 2016-03-07 NOTE — ED Notes (Signed)
Pt drowsy but oriented when woke. Wakes to verbal. Mild expiratory wheezing noted. No respiratory distress.

## 2016-03-07 NOTE — ED Notes (Signed)
0725-pt placed on bedpan. Pt urinated in bed. Sheets and gown changed, pt cleaned up.

## 2016-03-07 NOTE — H&P (Addendum)
[facility] - [place] at [facility]   PATIENT NAME: Barbara Oliver    MR#:  [id number]  DATE OF BIRTH:  [birth year]  DATE OF ADMISSION:  03/07/2016  PRIMARY CARE PHYSICIAN: No PCP Per Patient   REQUESTING/REFERRING PHYSICIAN: Irean Hong, MD  CHIEF COMPLAINT:   Chief Complaint  Patient presents with  . Respiratory Distress    HISTORY OF PRESENT ILLNESS:  Barbara Oliver  is a 66 y.o. female with a known history of cardiomyopathy with an EF of 20-25%, chronic left bundle-branch block brought to the ED from home via EMS with a chief complaint of respiratory distress. Patient has not taken her Lasix for the past 3 days due to Financial difficulty. Complains of progressive shortness of breath throughout the night.  She required BiPAP while in the emergency room and subsequently has been on nasal cannula.  She normally takes 40 mg daily along with spironolactone at 12.5 mg daily and carvedilol at 6.25 mg twice daily. Is unclear whether she has been compliant with the remainder of her medications either.  She says she is about 40% better. Chest x-ray on presentation revealed pulmonary vascular congestion. Electrocardiogram revealed sinus rhythm with left bundle branch block. Her serum troponin is unremarkable. PAST MEDICAL HISTORY:   Past Medical History:  Diagnosis Date  . Acid reflux   . CHF (congestive heart failure) (HCC)     PAST SURGICAL HISTORY:   Past Surgical History:  Procedure Laterality Date  . HERNIA REPAIR      SOCIAL HISTORY:   Social History  Substance Use Topics  . Smoking status: Current Some Day Smoker    Packs/day: 0.00    Types: Cigarettes  . Smokeless tobacco: Never Used  . Alcohol use No    FAMILY HISTORY:   Family History  Problem Relation Age of Onset  . Hypertension Mother   . Heart attack Father     DRUG ALLERGIES:  No Known Allergies  REVIEW OF SYSTEMS:   Review of Systems  Constitutional: Positive for  malaise/fatigue. Negative for chills, fever and weight loss.  HENT: Negative for nosebleeds and sore throat.   Eyes: Negative for blurred vision.  Respiratory: Positive for shortness of breath. Negative for cough and wheezing.   Cardiovascular: Negative for chest pain, orthopnea, leg swelling and PND.  Gastrointestinal: Negative for abdominal pain, constipation, diarrhea, heartburn, nausea and vomiting.  Genitourinary: Negative for dysuria and urgency.  Musculoskeletal: Negative for back pain.  Skin: Negative for rash.  Neurological: Positive for weakness. Negative for dizziness, speech change, focal weakness and headaches.  Endo/Heme/Allergies: Does not bruise/bleed easily.  Psychiatric/Behavioral: Negative for depression.    MEDICATIONS AT HOME:   Prior to Admission medications   Medication Sig Start Date End Date Taking? Authorizing Provider  carvedilol (COREG) 6.25 MG tablet Take 1 tablet (6.25 mg total) by mouth 2 (two) times daily. 10/06/15   Katharina Caper, MD  furosemide (LASIX) 40 MG tablet Take 1 tablet (40 mg total) by mouth daily. 10/06/15   Katharina Caper, MD  lisinopril (PRINIVIL,ZESTRIL) 2.5 MG tablet Take 1 tablet (2.5 mg total) by mouth daily. 10/06/15   Katharina Caper, MD  pantoprazole (PROTONIX) 40 MG tablet Take 1 tablet (40 mg total) by mouth daily. Switch for any other PPI at similar dose and frequency 10/06/15   Katharina Caper, MD  potassium chloride SA (K-DUR,KLOR-CON) 20 MEQ tablet Take 1 tablet (20 mEq total) by mouth every other day. 10/06/15   Katharina Caper, MD  VITAL SIGNS:  Blood pressure 107/71, pulse 80, temperature 98.3 F (36.8 C), temperature source Rectal, resp. rate (!) 23, weight 76.2 kg (168 lb), SpO2 100 %.  PHYSICAL EXAMINATION:  Physical Exam  Constitutional: She is oriented to person, place, and time and well-developed, well-nourished, and in no distress.  HENT:  Head: Normocephalic and atraumatic.  Eyes: Conjunctivae and EOM are normal. Pupils  are equal, round, and reactive to light.  Neck: Normal range of motion. Neck supple. No tracheal deviation present. No thyromegaly present.  Cardiovascular: Normal rate, regular rhythm and normal heart sounds.   Pulmonary/Chest: Effort normal. No respiratory distress. She has no wheezes. She has rales. She exhibits no tenderness.  Abdominal: Soft. Bowel sounds are normal. She exhibits no distension. There is no tenderness.  Musculoskeletal: Normal range of motion.  Neurological: She is alert and oriented to person, place, and time. No cranial nerve deficit.  Skin: Skin is warm and dry. No rash noted.  Psychiatric: Mood and affect normal.   LABORATORY PANEL:   CBC  Recent Labs Lab 03/07/16 0507  WBC 9.8  HGB 12.0  HCT 37.7  PLT 423   ------------------------------------------------------------------------------------------------------------------  Chemistries   Recent Labs Lab 03/07/16 0507  NA 138  K 4.6  CL 106  CO2 22  GLUCOSE 221*  BUN 30*  CREATININE 1.48*  CALCIUM 8.9  AST 73*  ALT 38  ALKPHOS 167*  BILITOT 0.7   ------------------------------------------------------------------------------------------------------------------  Cardiac Enzymes  Recent Labs Lab 03/07/16 0507  TROPONINI 0.03*   ------------------------------------------------------------------------------------------------------------------  RADIOLOGY:  Dg Chest Port 1 View  Result Date: 03/07/2016 CLINICAL DATA:  Respiratory distress and history of congestive heart failure EXAM: PORTABLE CHEST 1 VIEW COMPARISON:  Chest radiograph 12/13/2015 FINDINGS: Cardiac silhouette is enlarged with aortic arch atherosclerotic calcification. There are bilateral hazy interstitial opacities. No pneumothorax or pleural effusion. No lobar consolidation. IMPRESSION: Cardiomegaly, atherosclerosis of the aorta and mild interstitial pulmonary edema. Electronically Signed   By: Deatra RobinsonKevin  Herman M.D.   On: 03/07/2016  05:14   IMPRESSION AND PLAN:  65 year old female being admitted for acute on chronic systolic CHF.    * Acute on chronic systolic CHF exacerbation - Continue IV Lasix 40 mg twice a day strict I's and O's and daily weights - Continue lisinopril and Coreg - BNP very elevated  * GERD - Continue Protonix  * Cough - Likely viral URI.  We will add Robitussin and Tessalon Perles - Off note, she just finished course of doxycycline for 10 days for same symptoms.  * Elevated blood sugar - Check hemoglobin A1c, cannot rule out diabetes   All the records are reviewed and case discussed with ED provider. Management plans discussed with the patient, family and they are in agreement.  CODE STATUS: FULL CODE  TOTAL TIME TAKING CARE OF THIS PATIENT: 45 minutes.    Delfino LovettVipul Ziah Leandro M.D on 03/07/2016 at 7:43 AM  Between 7am to 6pm - Pager - [phone]  After 6pm go to [email or web address] - Scientist, research (life sciences)password [email or web address]  [facility] [place] [facility]  Office  [phone]  CC: Primary care physician; No PCP Per Patient   Note: This dictation was prepared with Dragon dictation along with smaller phrase technology. Any transcriptional errors that result from this process are unintentional.

## 2016-03-07 NOTE — Progress Notes (Signed)
Initial Nutrition Assessment  DOCUMENTATION CODES:   Severe malnutrition in context of chronic illness  INTERVENTION:   Ensure Enlive po BID, each supplement provides 350 kcal and 20 grams of protein  NUTRITION DIAGNOSIS:   Malnutrition related to chronic illness as evidenced by severe depletion of body fat, severe depletion of muscle mass.  GOAL:   Patient will meet greater than or equal to 90% of their needs  MONITOR:   PO intake, Supplement acceptance  REASON FOR ASSESSMENT:   Consult Assessment of nutrition requirement/status  ASSESSMENT:   65 y.o. female brought to the ED from home via EMS with a chief complaint of respiratory distress. Patient has a history of congestive heart failure, who has not taken her Lasix for the past 3 days.    Met with pt in room today. Pt eating 100% meals and reports good appetite pta. Per chart, pt is weight stable. Educated pt today following a heart healthy diet; mainly by reducing salt intake. Pt reports that she does not usually add salt to foods. Talked to pt about foods that contain hidden salt. Gave pt handouts to reinforce information.   Medications reviewed and include: aspirin, colace, lasix, heparin, KCl   Labs reviewed: BUN 30(H), creat 1.48(H), AlkPhos 167(H), Alb 3.2(L) AST 73(H), BNP 2462(H)  Nutrition-Focused physical exam completed. Findings are severe fat depletion, severe muscle depletion, and mild edema.   Diet Order:  Diet Heart Room service appropriate? Yes; Fluid consistency: Thin  Skin:  Reviewed, no issues  Last BM:  none since admit  Height:   Ht Readings from Last 1 Encounters:  03/07/16 '5\' 10"'$  (1.778 m)    Weight:   Wt Readings from Last 1 Encounters:  03/07/16 154 lb 3.2 oz (69.9 kg)    Ideal Body Weight:  68.1 kg  BMI:  Body mass index is 22.13 kg/m.  Estimated Nutritional Needs:   Kcal:  1750-2100kcal/day   Protein:  68-82g/day   Fluid:  74m/kcal/day or per MD  EDUCATION NEEDS:    Education needs addressed  CKoleen Distance RD, LDN Pager #- [phone]

## 2016-03-07 NOTE — ED Provider Notes (Signed)
[facility] Emergency Department Provider Note   ____________________________________________   First MD Initiated Contact with Patient 03/07/16 [phone]     (approximate)  I have reviewed the triage vital signs and the nursing notes.   HISTORY  Chief Complaint Respiratory Distress  History limited secondary to patient's distress  HPI Barbara Oliver is a 65 y.o. female brought to the ED from home via EMS with a chief complaint of respiratory distress. Patient has a history of congestive heart failure, who has not taken her Lasix for the past 3 days. Complains of progressive shortness of breath throughout the night. Rest of history is unable to be obtained secondary to patient's distress and repeated yelling "I need Lasix" and "I can't breathe, just give me a minute" while RT is attempting to place BiPAP.   Past Medical History:  Diagnosis Date  . Acid reflux   . CHF (congestive heart failure) [facility])     Patient Active Problem List   Diagnosis Date Noted  . Abdominal pain, epigastric 10/06/2015  . Elevated transaminase level 10/06/2015  . Acute renal insufficiency 10/06/2015  . Hyperglycemia 10/06/2015  . Severe mitral regurgitation 10/06/2015  . 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 Acute on chronic systolic CHF (congestive heart failure) (HCC) 10/05/2015    Past Surgical History:  Procedure Laterality Date  . HERNIA REPAIR      Prior to Admission medications   Medication Sig Start Date End Date Taking? Authorizing Provider  carvedilol (COREG) 6.25 MG tablet Take 1 tablet (6.25 mg total) by mouth 2 (two) times daily. 10/06/15   Katharina Caper, MD  furosemide (LASIX) 40 MG tablet Take 1 tablet (40 mg total) by mouth daily. 10/06/15   Katharina Caper, MD  lisinopril (PRINIVIL,ZESTRIL) 2.5 MG tablet Take 1 tablet (2.5 mg total) by mouth daily. 10/06/15   Katharina Caper, MD  pantoprazole (PROTONIX) 40 MG tablet Take 1 tablet (40 mg total) by  mouth daily. Switch for any other PPI at similar dose and frequency 10/06/15   Katharina Caper, MD  potassium chloride SA (K-DUR,KLOR-CON) 20 MEQ tablet Take 1 tablet (20 mEq total) by mouth every other day. 10/06/15   Katharina Caper, MD    Allergies Patient has no known allergies.  Family History  Problem Relation Age of Onset  . Hypertension Mother   . Heart attack Father     Social History Social History  Substance Use Topics  . Smoking status: Current Some Day Smoker    Packs/day: 0.00    Types: Cigarettes  . Smokeless tobacco: Never Used  . Alcohol use No    Review of Systems  Constitutional: No fever/chills. Eyes: No visual changes. ENT: No sore throat. Cardiovascular: Denies chest pain. Respiratory: Positive for shortness of breath. Gastrointestinal: No abdominal pain.  No nausea, no vomiting.  No diarrhea.  No constipation. Genitourinary: Negative for dysuria. Musculoskeletal: Negative for back pain. Skin: Negative for rash. Neurological: Negative for headaches, focal weakness or numbness.  Limited secondary to distress; 10-point ROS otherwise negative.  ____________________________________________   PHYSICAL EXAM:  VITAL SIGNS: ED Triage Vitals  Enc Vitals Group     BP      Pulse      Resp      Temp      Temp src      SpO2      Weight      Height      Head Circumference      Peak Flow  Pain Score      Pain Loc      Pain Edu?      Excl. in GC?     Constitutional: Alert and oriented. Ill appearing and in moderate acute distress. Eyes: Conjunctivae are normal. PERRL. EOMI. Head: Atraumatic. Nose: No congestion/rhinnorhea. Mouth/Throat: Mucous membranes are moist.  Oropharynx non-erythematous. Neck: No stridor.   Cardiovascular: Normal rate, regular rhythm. Grossly normal heart sounds.  Good peripheral circulation. Respiratory: Increased respiratory effort.  No retractions. Lungs with diffuse rales. Gastrointestinal: Soft and nontender. No  distention. No abdominal bruits. No CVA tenderness. Musculoskeletal: No lower extremity tenderness. 1+ BLE pitting edema.  No joint effusions. Neurologic:  Normal speech and language. No gross focal neurologic deficits are appreciated.  Skin:  Skin is warm, dry and intact. No rash noted. Psychiatric: Mood and affect are normal. Speech and behavior are normal.  ____________________________________________   LABS (all labs ordered are listed, but only abnormal results are displayed)  Labs Reviewed  CBC WITH DIFFERENTIAL/PLATELET - Abnormal; Notable for the following:       Result Value   RDW 16.2 (*)    Lymphs Abs 3.8 (*)    All other components within normal limits  COMPREHENSIVE METABOLIC PANEL - Abnormal; Notable for the following:    Glucose, Bld 221 (*)    BUN 30 (*)    Creatinine, Ser 1.48 (*)    Albumin 3.2 (*)    AST 73 (*)    Alkaline Phosphatase 167 (*)    GFR calc non Af Amer 36 (*)    GFR calc Af Amer 42 (*)    All other components within normal limits  BRAIN NATRIURETIC PEPTIDE - Abnormal; Notable for the following:    B Natriuretic Peptide 2,462.0 (*)    All other components within normal limits  TROPONIN I - Abnormal; Notable for the following:    Troponin I 0.03 (*)    All other components within normal limits  BLOOD GAS, ARTERIAL - Abnormal; Notable for the following:    pH, Arterial 7.20 (*)    pO2, Arterial 383 (*)    Bicarbonate 18.8 (*)    Acid-base deficit 9.2 (*)    All other components within normal limits   ____________________________________________  EKG  ED ECG REPORT I, Giordan Fordham J, the attending physician, personally viewed and interpreted this ECG.   Date: 03/07/2016  EKG Time: 0508  Rate: 90  Rhythm: normal EKG, normal sinus rhythm  Axis: LAD  Intervals:left bundle branch block  ST&T Change: Nonspecific  ____________________________________________  RADIOLOGY  Portable chest x-ray (viewed by me, interpreted per Dr.  Chase Picket):  Cardiomegaly, atherosclerosis of the aorta and mild interstitial  pulmonary edema.   ____________________________________________   PROCEDURES  Procedure(s) performed: None  Procedures  Critical Care performed: Yes, see critical care note(s)  CRITICAL CARE Performed by: Irean Hong   Total critical care time: 30 minutes  Critical care time was exclusive of separately billable procedures and treating other patients.  Critical care was necessary to treat or prevent imminent or life-threatening deterioration.  Critical care was time spent personally by me on the following activities: development of treatment plan with patient and/or surrogate as well as nursing, discussions with consultants, evaluation of patient's response to treatment, examination of patient, obtaining history from patient or surrogate, ordering and performing treatments and interventions, ordering and review of laboratory studies, ordering and review of radiographic studies, pulse oximetry and re-evaluation of patient's condition. ____________________________________________   INITIAL IMPRESSION / ASSESSMENT AND  PLAN / ED COURSE  Pertinent labs & imaging results that were available during my care of the patient were reviewed by me and considered in my medical decision making (see chart for details).  65 year old female who presents in moderate respiratory distress secondary to pulmonary edema. She is agitated and fighting the BiPAP, requiring calming agent. Enalapril, Lasix and Ativan given immediately upon patient's arrival to the treatment room.  Clinical Course as of Mar 07 628  Thu Mar 07, 2016  [id number] Patient more comfortable after Ativan. Tolerating BiPAP well.  [JS]  [id number] Patient weaned off BiPAP. Currently 100% on 4 L nasal cannula. Sleeping comfortably. Urinated large volume in bedpan. Discussed with hospitalist to evaluate in the emergency department for admission.  [JS]    Clinical Course  User Index [JS] Irean HongJade J Matalynn Graff, MD     ____________________________________________   FINAL CLINICAL IMPRESSION(S) / ED DIAGNOSES  Final diagnoses:  Respiratory distress  Acute pulmonary edema (HCC)  Acute on chronic congestive heart failure, unspecified congestive heart failure type (HCC)      NEW MEDICATIONS STARTED DURING THIS VISIT:  New Prescriptions   No medications on file     Note:  This document was prepared using Dragon voice recognition software and may include unintentional dictation errors.    Irean HongJade J Kendalyn Cranfield, MD 03/07/16 [id number]

## 2016-03-07 NOTE — Care Management (Addendum)
Patient admitted with exac of CHF.  she is without a payor. Informed staff she ran out of her meds.  Receives [facility] of 700 hundred and some dollars a month.  She was referred to [facility] in July and did not keep appointment.  She says that she completed the [facility] application but "they said I could not be seen."  Patient has not completed an application according to [facility].  Discussed that she needs to complete the process and she should meet criteria for the clinic and assist with medications from [facility].  Provided applications so they can be started.  Faxed anticipated discharge med scripts to [facility] with anticipated discharge of 12.29. Patient is independent in her adls and denies issues with transportation.  She has no PCP.

## 2016-03-08 LAB — BASIC METABOLIC PANEL
Anion gap: 11 (ref 5–15)
BUN: 40 mg/dL — AB (ref 6–20)
CALCIUM: 9 mg/dL (ref 8.9–10.3)
CO2: 27 mmol/L (ref 22–32)
Chloride: 100 mmol/L — ABNORMAL LOW (ref 101–111)
Creatinine, Ser: 1.07 mg/dL — ABNORMAL HIGH (ref 0.44–1.00)
GFR calc Af Amer: >60 mL/min (ref >60)
GFR, EST NON AFRICAN AMERICAN: 54 mL/min — AB (ref >60)
GLUCOSE: 160 mg/dL — AB (ref 65–99)
Potassium: 3.6 mmol/L (ref 3.5–5.1)
Sodium: 138 mmol/L (ref 135–145)

## 2016-03-08 LAB — GLUCOSE, CAPILLARY: GLUCOSE-CAPILLARY: 89 mg/dL (ref 65–99)

## 2016-03-08 LAB — HEMOGLOBIN A1C
HEMOGLOBIN A1C: 5.2 % (ref 4.8–5.6)
MEAN PLASMA GLUCOSE: 103 mg/dL

## 2016-03-08 LAB — CBC
HEMATOCRIT: 37.5 % (ref 35.0–47.0)
Hemoglobin: 12.4 g/dL (ref 12.0–16.0)
MCH: 29.9 pg (ref 26.0–34.0)
MCHC: 33.1 g/dL (ref 32.0–36.0)
MCV: 90.4 fL (ref 80.0–100.0)
Platelets: 419 10*3/uL (ref 150–440)
RBC: 4.15 MIL/uL (ref 3.80–5.20)
RDW: 15.8 % — AB (ref 11.5–14.5)
WBC: 8.8 10*3/uL (ref 3.6–11.0)

## 2016-03-08 LAB — INFLUENZA PANEL BY PCR (TYPE A & B)
INFLAPCR: NEGATIVE
Influenza B By PCR: NEGATIVE

## 2016-03-08 MED ORDER — SPIRONOLACTONE 25 MG PO TABS
12.5000 mg | ORAL_TABLET | Freq: Every day | ORAL | Status: DC
  Administered 2016-03-08 – 2016-03-11 (×4): 12.5 mg via ORAL
  Filled 2016-03-08 (×4): qty 1

## 2016-03-08 NOTE — Progress Notes (Signed)
Palliative Medicine Consult Order Noted. Due to high referral volume and limited staffing, there will be a delay seeing this patient. Palliative Medicine Provider will return to [facility] on 03/12/2016, and patient will be evaluated then. Please call the Palliative Medicine Team office at [phone] if recommendations are needed in the interim.  Thank you for inviting [place] to see this patient.  Margret Chance Cristina Mattern, RN, BSN, [facility] 03/08/2016 10:41 AM Cell [phone] 8:00-4:00 Monday-Friday Office [phone]

## 2016-03-08 NOTE — Progress Notes (Signed)
SATURATION QUALIFICATIONS: (This note is used to comply with regulatory documentation for home oxygen)  Patient Saturations on Room Air at Rest = 99%  Patient Saturations on Room Air while Ambulating = 97%  Patient Saturations on Liters of oxygen while Ambulating = N/A  Please briefly explain why patient needs home oxygen:

## 2016-03-08 NOTE — Care Management (Signed)
Has not qualified for home oxygen.  Obtained patient's discharge prescriptions filled at [facility] and gave them to primary nurse who put them in patient's pharmacy drawer on the unit.  If required, meds will be sent to the pharmacy for safe keeping.  There is an Eligibility appointment date and time stapled to the bag of meds.Barbara Oliver

## 2016-03-08 NOTE — Progress Notes (Addendum)
[facility] - [place] at [facility]   PATIENT NAME: Barbara Oliver    MR#:  [id number]  DATE OF BIRTH:  [birth year]  SUBJECTIVE:  CHIEF COMPLAINT:   Chief Complaint  Patient presents with  . Respiratory Distress  Reports constant coughing at times lasting 2 hours,. feeling Short of breath REVIEW OF SYSTEMS:  Review of Systems  Constitutional: Positive for malaise/fatigue. Negative for chills, fever and weight loss.  HENT: Negative for nosebleeds and sore throat.   Eyes: Negative for blurred vision.  Respiratory: Positive for cough and shortness of breath. Negative for wheezing.   Cardiovascular: Negative for chest pain, orthopnea, leg swelling and PND.  Gastrointestinal: Negative for abdominal pain, constipation, diarrhea, heartburn, nausea and vomiting.  Genitourinary: Negative for dysuria and urgency.  Musculoskeletal: Negative for back pain.  Skin: Negative for rash.  Neurological: Positive for weakness. Negative for dizziness, speech change, focal weakness and headaches.  Endo/Heme/Allergies: Does not bruise/bleed easily.  Psychiatric/Behavioral: Negative for depression.    DRUG ALLERGIES:  No Known Allergies VITALS:  Blood pressure 106/60, pulse 80, temperature 97.6 F (36.4 C), temperature source Oral, resp. rate 16, height 5\' 10"  (1.778 m), weight 68.3 kg (150 lb 9.6 oz), SpO2 98 %. PHYSICAL EXAMINATION:  Physical Exam  Constitutional: She is oriented to person, place, and time. Vital signs are normal. She appears malnourished. She appears unhealthy. She appears cachectic.  HENT:  Head: Normocephalic and atraumatic.  Eyes: Conjunctivae and EOM are normal. Pupils are equal, round, and reactive to light.  Neck: Normal range of motion. Neck supple. No tracheal deviation present. No thyromegaly present.  Cardiovascular: Normal rate, regular rhythm and normal heart sounds.   Pulmonary/Chest: She is in respiratory distress. She has decreased breath  sounds. She has wheezes. She exhibits no tenderness.  Abdominal: Soft. Bowel sounds are normal. She exhibits no distension. There is no tenderness.  Musculoskeletal: Normal range of motion.  Neurological: She is alert and oriented to person, place, and time. No cranial nerve deficit.  Skin: Skin is warm and dry. No rash noted.  Psychiatric: Mood and affect normal.   LABORATORY PANEL:   CBC  Recent Labs Lab 03/08/16 0554  WBC 8.8  HGB 12.4  HCT 37.5  PLT 419   ------------------------------------------------------------------------------------------------------------------ Chemistries   Recent Labs Lab 03/07/16 0507 03/08/16 0554  NA 138 138  K 4.6 3.6  CL 106 100*  CO2 22 27  GLUCOSE 221* 160*  BUN 30* 40*  CREATININE 1.48* 1.07*  CALCIUM 8.9 9.0  AST 73*  --   ALT 38  --   ALKPHOS 167*  --   BILITOT 0.7  --    RADIOLOGY:  No results found. ASSESSMENT AND PLAN:  65 year old female admitted for acute on chronic systolic CHF.    * Acute on chronic systolic CHF exacerbation - Continue IV Lasix 40 mg twice a day strict I's and O's and daily weights - Continue Coreg, add spironolactone - BNP very elevated - Stop lisinopril due to chronic cough - Neg 1.2 liters thus far  * GERD - Continue Protonix  * Cough - She reports chronic cough -this could be related to ACE inhibitor and/or silent reflux.  Stop lisinopril  - HIV antibodies and pertussis PCR - ? COPD - will add steroids to see if it helps - Likely viral URI.   continue Robitussin and Tessalon Perles - Off note, she just finished course of doxycycline for 10 days for same symptoms.  * Acute renal failure -  Improved, monitor     All the records are reviewed and case discussed with Care Management/Social Worker. Management plans discussed with the patient, nursing and they are in agreement.  CODE STATUS: Full code, Palliative care consultation  TOTAL TIME TAKING CARE OF THIS PATIENT: 35   minutes.   More than 50% of the time was spent in counseling/coordination of care: YES  POSSIBLE D/C IN 1-2 DAYS, DEPENDING ON CLINICAL CONDITION.   Delfino LovettVipul Johnnathan Hagemeister M.D on 03/08/2016 at 10:40 AM  Between 7am to 6pm - Pager - [phone]  After 6pm go to [email or web address] - Scientist, research (life sciences)password [email or web address] [email or web address]  [facility] [place] [facility]  Office  [phone]  CC: Primary care physician; No PCP Per Patient  Note: This dictation was prepared with Dragon dictation along with smaller phrase technology. Any transcriptional errors that result from this process are unintentional.

## 2016-03-09 LAB — BASIC METABOLIC PANEL
ANION GAP: 8 (ref 5–15)
BUN: 41 mg/dL — ABNORMAL HIGH (ref 6–20)
CO2: 33 mmol/L — ABNORMAL HIGH (ref 22–32)
Calcium: 10 mg/dL (ref 8.9–10.3)
Chloride: 95 mmol/L — ABNORMAL LOW (ref 101–111)
Creatinine, Ser: 1.03 mg/dL — ABNORMAL HIGH (ref 0.44–1.00)
GFR calc Af Amer: >60 mL/min (ref >60)
GFR, EST NON AFRICAN AMERICAN: 56 mL/min — AB (ref >60)
GLUCOSE: 102 mg/dL — AB (ref 65–99)
POTASSIUM: 4.8 mmol/L (ref 3.5–5.1)
Sodium: 136 mmol/L (ref 135–145)

## 2016-03-09 LAB — GLUCOSE, CAPILLARY
Glucose-Capillary: 122 mg/dL — ABNORMAL HIGH (ref 65–99)
Glucose-Capillary: 87 mg/dL (ref 65–99)

## 2016-03-09 LAB — HIV ANTIBODY (ROUTINE TESTING W REFLEX): HIV SCREEN 4TH GENERATION: NONREACTIVE

## 2016-03-09 MED ORDER — FUROSEMIDE 40 MG PO TABS
40.0000 mg | ORAL_TABLET | Freq: Two times a day (BID) | ORAL | Status: DC
  Administered 2016-03-09 – 2016-03-11 (×4): 40 mg via ORAL
  Filled 2016-03-09 (×4): qty 1

## 2016-03-09 NOTE — Progress Notes (Signed)
Pt. Slept well throughout the night with no c/o SOB, acute distress or pain. Cough medication given x2, with effective results. Pt assisted to [facility]  With stand by assist occasionally.

## 2016-03-09 NOTE — Care Management Note (Signed)
Case Management Note  Patient Details  Name: Barbara Oliver MRN: [id number] Date of Birth: 1950-08-28  Subjective/Objective:         Per note from weekday Case Manager, [name]'s meds have been filled by [facility] and are in her pharmacy drawer to be given to her by her nurse at time of discharge.            Action/Plan:   Expected Discharge Date:                  Expected Discharge Plan:     In-House Referral:     Discharge planning Services     Post Acute Care Choice:    Choice offered to:     DME Arranged:    DME Agency:     HH Arranged:    HH Agency:     Status of Service:     If discussed at [employer] of Stay Meetings, dates discussed:    Additional Comments:  [name] A, RN 03/09/2016, 4:02 PM

## 2016-03-09 NOTE — Progress Notes (Signed)
[facility] - [place] at [facility]   PATIENT NAME: Barbara Oliver    MR#:  [id number]  DATE OF BIRTH:  03-22-1950  SUBJECTIVE:  CHIEF COMPLAINT:   Chief Complaint  Patient presents with  . Respiratory Distress  Cough and shortness of breath are better  REVIEW OF SYSTEMS:  Review of Systems  Constitutional: Positive for malaise/fatigue. Negative for chills, fever and weight loss.  HENT: Negative for nosebleeds and sore throat.   Eyes: Negative for blurred vision.  Respiratory: Positive for cough and shortness of breath. Negative for wheezing.   Cardiovascular: Negative for chest pain, orthopnea, leg swelling and PND.  Gastrointestinal: Negative for abdominal pain, constipation, diarrhea, heartburn, nausea and vomiting.  Genitourinary: Negative for dysuria and urgency.  Musculoskeletal: Negative for back pain.  Skin: Negative for rash.  Neurological: Positive for weakness. Negative for dizziness, speech change, focal weakness and headaches.  Endo/Heme/Allergies: Does not bruise/bleed easily.  Psychiatric/Behavioral: Negative for depression.    DRUG ALLERGIES:  No Known Allergies VITALS:  Blood pressure (!) 97/47, pulse (!) 45, temperature 97.8 F (36.6 C), temperature source Oral, resp. rate (!) 22, height 5\' 10"  (1.778 m), weight 67.5 kg (148 lb 14.4 oz), SpO2 97 %. PHYSICAL EXAMINATION:  Physical Exam  Constitutional: She is oriented to person, place, and time. Vital signs are normal. She appears malnourished. She appears unhealthy. She appears cachectic.  HENT:  Head: Normocephalic and atraumatic.  Eyes: Conjunctivae and EOM are normal. Pupils are equal, round, and reactive to light.  Neck: Normal range of motion. Neck supple. No tracheal deviation present. No thyromegaly present.  Cardiovascular: Normal rate, regular rhythm and normal heart sounds.   Pulmonary/Chest: She is in respiratory distress. She has decreased breath sounds. She has wheezes. She  exhibits no tenderness.  Abdominal: Soft. Bowel sounds are normal. She exhibits no distension. There is no tenderness.  Musculoskeletal: Normal range of motion.  Neurological: She is alert and oriented to person, place, and time. No cranial nerve deficit.  Skin: Skin is warm and dry. No rash noted.  Psychiatric: Mood and affect normal.   LABORATORY PANEL:   CBC  Recent Labs Lab 03/08/16 0554  WBC 8.8  HGB 12.4  HCT 37.5  PLT 419   ------------------------------------------------------------------------------------------------------------------ Chemistries   Recent Labs Lab 03/07/16 0507  03/09/16 0524  NA 138  < > 136  K 4.6  < > 4.8  CL 106  < > 95*  CO2 22  < > 33*  GLUCOSE 221*  < > 102*  BUN 30*  < > 41*  CREATININE 1.48*  < > 1.03*  CALCIUM 8.9  < > 10.0  AST 73*  --   --   ALT 38  --   --   ALKPHOS 167*  --   --   BILITOT 0.7  --   --   < > = values in this interval not displayed. RADIOLOGY:  No results found. ASSESSMENT AND PLAN:  65 year old female admitted for acute on chronic systolic CHF.    * Acute on chronic systolic CHF exacerbation - Change  IV to PO  Lasix 40 mg twice a day strict I's and O's and daily weights - Continue Coreg 6.25 mg by mouth twice a day and taper Lasix to 40 mg once daily add spironolactone 12.5 mg once daily - BNP very elevated - Stopped lisinopril due to chronic cough - Neg 1.2 liters thus far -followed by [facility] cardio, recommending outpatient follow-up with [facility] primary  cardiologist after discharge  * GERD - Continue Protonix  * Cough - She reports chronic cough -this could be related to ACE inhibitor , patient clinically feels better after stopping ACE inhibitor  - HIV antibodies negative and pertussis PCR pending -Flu test negative - ? COPD - will add steroids to see if it helps - Likely viral URI.   continue Robitussin and Tessalon Perles - Off note, she just finished course of doxycycline for 10 days for same  symptoms.  * Acute renal failure - Improved, monitor  *Consult social services regarding [employer], financial assistance   All the records are reviewed and case discussed with Care Management/Social Worker. Management plans discussed with the patient, nursing and they are in agreement.  CODE STATUS: Full code, Palliative care consultation  TOTAL TIME TAKING CARE OF THIS PATIENT: 35  minutes.   More than 50% of the time was spent in counseling/coordination of care: YES  POSSIBLE D/C IN 1-2 DAYS, DEPENDING ON CLINICAL CONDITION.   Ramonita LabGouru, Samyah Bilbo M.D on 03/09/2016 at 2:55 PM  Between 7am to 6pm - Pager - [phone]  After 6pm go to [email or web address] - Scientist, research (life sciences)password EPAS [facility]  [facility] [place] [facility]  Office  [phone]  CC: Primary care physician; No PCP Per Patient  Note: This dictation was prepared with Dragon dictation along with smaller phrase technology. Any transcriptional errors that result from this process are unintentional.

## 2016-03-09 NOTE — Clinical Social Work Note (Signed)
CSW received consult for medication assistance. CSW alerted RNCM as CSW does not assist with medications. CSW signing off.  Argentina Ponder, MSW, LCSW-A [phone]

## 2016-03-10 ENCOUNTER — Inpatient Hospital Stay: Payer: Medicaid Other

## 2016-03-10 LAB — BASIC METABOLIC PANEL
Anion gap: 9 (ref 5–15)
BUN: 45 mg/dL — AB (ref 6–20)
CALCIUM: 9.7 mg/dL (ref 8.9–10.3)
CO2: 31 mmol/L (ref 22–32)
CREATININE: 0.92 mg/dL (ref 0.44–1.00)
Chloride: 94 mmol/L — ABNORMAL LOW (ref 101–111)
GFR calc Af Amer: >60 mL/min (ref >60)
GLUCOSE: 94 mg/dL (ref 65–99)
Potassium: 4.4 mmol/L (ref 3.5–5.1)
Sodium: 134 mmol/L — ABNORMAL LOW (ref 135–145)

## 2016-03-10 LAB — BORDETELLA PERTUSSIS PCR
B PARAPERTUSSIS, DNA: NEGATIVE
B pertussis, DNA: NEGATIVE

## 2016-03-10 LAB — GLUCOSE, CAPILLARY: Glucose-Capillary: 85 mg/dL (ref 65–99)

## 2016-03-10 MED ORDER — HEPARIN (PORCINE) IN NACL 100-0.45 UNIT/ML-% IJ SOLN
INTRAMUSCULAR | Status: AC
  Filled 2016-03-10: qty 250

## 2016-03-10 NOTE — Consult Note (Signed)
[facility] Infectious Disease     Reason for Consult: Chronic cough     Referring Physician: Gouru, A Date of Admission:  03/07/2016   Active Problems:   CHF (congestive heart failure) (HCC)   Protein-calorie malnutrition, severe   HPI: Barbara Oliver is a 65 y.o. female admitted with resp distress and sob. She has hx CHF and reports a chronic cough for a year. Admitted at [facility] in [place] and had CXR at that time.  On this admission she has nml wbc, no fevers.  SHe is on ACE I. Denies fevers, chills wt loss. Is a smoker and has been for many years but reports currently a pack will last several weeks.   Past Medical History:  Diagnosis Date  . Acid reflux   . CHF (congestive heart failure) (HCC)   . Mitral regurgitation    Past Surgical History:  Procedure Laterality Date  . HERNIA REPAIR     Social History  Substance Use Topics  . Smoking status: Current Some Day Smoker    Packs/day: 0.00    Types: Cigarettes  . Smokeless tobacco: Never Used  . Alcohol use No   Family History  Problem Relation Age of Onset  . Hypertension Mother   . Heart attack Father     Allergies: No Known Allergies  Current antibiotics: Antibiotics Given (last 72 hours)    None      MEDICATIONS: . heparin      . aspirin EC  81 mg Oral Daily  . carvedilol  6.25 mg Oral BID  . docusate sodium  100 mg Oral BID  . feeding supplement (ENSURE ENLIVE)  237 mL Oral TID BM  . furosemide  40 mg Oral BID  . heparin  5,000 Units Subcutaneous Q8H  . potassium chloride SA  20 mEq Oral Daily  . sodium chloride flush  3 mL Intravenous Q12H  . sodium chloride flush  3 mL Intravenous Q12H  . spironolactone  12.5 mg Oral Daily    Review of Systems - 11 systems reviewed and negative per HPI   OBJECTIVE: Temp:  [97.8 F (36.6 C)-98.6 F (37 C)] 97.8 F (36.6 C) (12/31 0500) Pulse Rate:  [45-92] 80 (12/31 0500) Resp:  [18-22] 18 (12/31 0500) BP: (97-111)/(47-69) 97/54 (12/31 0500) SpO2:  [97 %-100  %] 100 % (12/31 0500) Weight:  [66.7 kg (147 lb 1.6 oz)] 66.7 kg (147 lb 1.6 oz) (12/31 0500) Physical Exam  Constitutional:  oriented to person, place, and time. appears well-developed and well-nourished. No distress.thin  HENT: [place]/AT, PERRLA, no scleral icterus Mouth/Throat: Oropharynx is clear and moist. No oropharyngeal exudate.  Cardiovascular: Normal rate, regular rhythm and normal heart sounds. Exam reveals no gallop and no friction rub.  No murmur heard.  Pulmonary/Chest: Effort normal and breath sounds normal. No respiratory distress.  has no wheezes.  Neck = supple, no nuchal rigidity Abdominal: Soft. Bowel sounds are normal.  exhibits no distension. There is no tenderness.  Lymphadenopathy: no cervical adenopathy. No axillary adenopathy Neurological: alert and oriented to person, place, and time.  Skin: Skin is warm and dry. No rash noted. No erythema.  Psychiatric: a normal mood and affect.  behavior is normal.    LABS: Results for orders placed or performed during the hospital encounter of 03/07/16 (from the past 48 hour(s))  HIV antibody     Status: None   Collection Time: 03/08/16 10:39 AM  Result Value Ref Range   HIV Screen 4th Generation wRfx Non Reactive  Non Reactive    Comment: (NOTE) Performed At: [facility] [place], [place] [id number] Lindon Romp MD YI:[id number]   Influenza panel by PCR (type A & B, H1N1)     Status: None   Collection Time: 03/08/16 10:51 AM  Result Value Ref Range   Influenza A By PCR NEGATIVE NEGATIVE   Influenza B By PCR NEGATIVE NEGATIVE    Comment: (NOTE) The Xpert Xpress Flu assay is intended as an aid in the diagnosis of  influenza and should not be used as a sole basis for treatment.  This  assay is FDA approved for nasopharyngeal swab specimens only. Nasal  washings and aspirates are unacceptable for Xpert Xpress Flu testing.   Bordetella pertussis PCR     Status: None   Collection Time: 03/08/16  12:41 PM  Result Value Ref Range   B pertussis, DNA Negative Negative   B parapertussis, DNA Negative Negative    Comment: (NOTE) This test was developed and its performance characteristics determined by [employer]. It has not been cleared or approved by the U.S. Food and Drug Administration. The FDA has determined that such clearance or approval is not necessary. This test is used for clinical purposes. It should not be regarded as investigational or research. Performed At: [facility] [place], [place] [id number] Lindon Romp MD XH:[id number]   Basic metabolic panel     Status: Abnormal   Collection Time: 03/09/16  5:24 AM  Result Value Ref Range   Sodium 136 135 - 145 mmol/L   Potassium 4.8 3.5 - 5.1 mmol/L   Chloride 95 (L) 101 - 111 mmol/L   CO2 33 (H) 22 - 32 mmol/L   Glucose, Bld 102 (H) 65 - 99 mg/dL   BUN 41 (H) 6 - 20 mg/dL   Creatinine, Ser 1.03 (H) 0.44 - 1.00 mg/dL   Calcium 10.0 8.9 - 10.3 mg/dL   GFR calc non Af Amer 56 (L) >60 mL/min   GFR calc Af Amer >60 >60 mL/min    Comment: (NOTE) The eGFR has been calculated using the CKD EPI equation. This calculation has not been validated in all clinical situations. eGFR's persistently <60 mL/min signify possible Chronic Kidney Disease.    Anion gap 8 5 - 15  Glucose, capillary     Status: Abnormal   Collection Time: 03/09/16  7:53 AM  Result Value Ref Range   Glucose-Capillary 122 (H) 65 - 99 mg/dL  Glucose, capillary     Status: None   Collection Time: 03/09/16 12:20 PM  Result Value Ref Range   Glucose-Capillary 87 65 - 99 mg/dL  Basic metabolic panel     Status: Abnormal   Collection Time: 03/10/16  4:38 AM  Result Value Ref Range   Sodium 134 (L) 135 - 145 mmol/L   Potassium 4.4 3.5 - 5.1 mmol/L   Chloride 94 (L) 101 - 111 mmol/L   CO2 31 22 - 32 mmol/L   Glucose, Bld 94 65 - 99 mg/dL   BUN 45 (H) 6 - 20 mg/dL   Creatinine, Ser 0.92 0.44 - 1.00 mg/dL   Calcium 9.7  8.9 - 10.3 mg/dL   GFR calc non Af Amer >60 >60 mL/min   GFR calc Af Amer >60 >60 mL/min    Comment: (NOTE) The eGFR has been calculated using the CKD EPI equation. This calculation has not been validated in all clinical situations. eGFR's persistently <60 mL/min signify possible Chronic Kidney  Disease.    Anion gap 9 5 - 15  Glucose, capillary     Status: None   Collection Time: 03/10/16  8:04 AM  Result Value Ref Range   Glucose-Capillary 85 65 - 99 mg/dL   No components found for: ESR, C REACTIVE PROTEIN MICRO: Recent Results (from the past 720 hour(s))  Bordetella pertussis PCR     Status: None   Collection Time: 03/08/16 12:41 PM  Result Value Ref Range Status   B pertussis, DNA Negative Negative Final   B parapertussis, DNA Negative Negative Final    Comment: (NOTE) This test was developed and its performance characteristics determined by [employer]. It has not been cleared or approved by the U.S. Food and Drug Administration. The FDA has determined that such clearance or approval is not necessary. This test is used for clinical purposes. It should not be regarded as investigational or research. Performed At: [facility] [place], [place] [id number] Lindon Romp MD RT:[id number]     IMAGINGDarletta Moll Chest Port 1 View  Result Date: 03/07/2016 CLINICAL DATA:  Respiratory distress and history of congestive heart failure EXAM: PORTABLE CHEST 1 VIEW COMPARISON:  Chest radiograph 12/13/2015 FINDINGS: Cardiac silhouette is enlarged with aortic arch atherosclerotic calcification. There are bilateral hazy interstitial opacities. No pneumothorax or pleural effusion. No lobar consolidation. IMPRESSION: Cardiomegaly, atherosclerosis of the aorta and mild interstitial pulmonary edema. Electronically Signed   By: Ulyses Jarred M.D.   On: 03/07/2016 05:14    Assessment:   Barbara Oliver is a 65 y.o. female with CHF and a chronic cough for over a year,  while on ACE I. She has neg HIV, Flu and Pertussis testing.  CXR shows what appears to be pulmonary edema.  She did have some multfocal infiltrates in Oct at [facility] on CXR there.   Recommendations No need for abx at this time - agree likely ace I which has been stopped Given cough for a year, some cxr abnormalities and smoking hx I think she should have a CT scan. She does not have a PCP and I Cannot find CT imaging in care everywhere.  Have ordered CT scan   Thank you very much for allowing me to participate in the care of this patient. Please call with questions.   Cheral Marker. Ola Spurr, MD

## 2016-03-10 NOTE — Progress Notes (Signed)
[employer] [facility] [place] [facility]   PATIENT NAME: Ashok Croonarldine Behrle    MR#:  [id number]  DATE OF BIRTH:  [birth year]  SUBJECTIVE:  CHIEF COMPLAINT:   Chief Complaint  Patient presents with  . Respiratory Distress  Cough and shortness of breath are better.No new complaints.  REVIEW OF SYSTEMS:  Review of Systems  Constitutional: Positive for malaise/fatigue. Negative for chills, fever and weight loss.  HENT: Negative for nosebleeds and sore throat.   Eyes: Negative for blurred vision.  Respiratory: Positive for cough. Negative for shortness of breath and wheezing.   Cardiovascular: Negative for chest pain, orthopnea, leg swelling and PND.  Gastrointestinal: Negative for abdominal pain, constipation, diarrhea, heartburn, nausea and vomiting.  Genitourinary: Negative for dysuria and urgency.  Musculoskeletal: Negative for back pain.  Skin: Negative for rash.  Neurological: Positive for weakness. Negative for dizziness, speech change, focal weakness and headaches.  Endo/Heme/Allergies: Does not bruise/bleed easily.  Psychiatric/Behavioral: Negative for depression.    DRUG ALLERGIES:  No Known Allergies VITALS:  Blood pressure 117/73, pulse 87, temperature 97.8 F (36.6 C), temperature source Oral, resp. rate 12, height 5\' 10"  (1.778 m), weight 66.7 kg (147 lb 1.6 oz), SpO2 99 %. PHYSICAL EXAMINATION:  Physical Exam  Constitutional: She is oriented to person, place, and time. Vital signs are normal. She appears malnourished. She appears unhealthy. She appears cachectic.  HENT:  Head: Normocephalic and atraumatic.  Eyes: Conjunctivae and EOM are normal. Pupils are equal, round, and reactive to light.  Neck: Normal range of motion. Neck supple. No tracheal deviation present. No thyromegaly present.  Cardiovascular: Normal rate, regular rhythm and normal heart sounds.   Pulmonary/Chest: No respiratory distress. She has decreased breath sounds. She has no wheezes.  She exhibits no tenderness.  Abdominal: Soft. Bowel sounds are normal. She exhibits no distension. There is no tenderness.  Musculoskeletal: Normal range of motion.  Neurological: She is alert and oriented to person, place, and time. No cranial nerve deficit.  Skin: Skin is warm and dry. No rash noted.  Psychiatric: Mood and affect normal.   LABORATORY PANEL:   CBC  Recent Labs Lab 03/08/16 0554  WBC 8.8  HGB 12.4  HCT 37.5  PLT 419   ------------------------------------------------------------------------------------------------------------------ Chemistries   Recent Labs Lab 03/07/16 0507  03/10/16 0438  NA 138  < > 134*  K 4.6  < > 4.4  CL 106  < > 94*  CO2 22  < > 31  GLUCOSE 221*  < > 94  BUN 30*  < > 45*  CREATININE 1.48*  < > 0.92  CALCIUM 8.9  < > 9.7  AST 73*  --   --   ALT 38  --   --   ALKPHOS 167*  --   --   BILITOT 0.7  --   --   < > = values in this interval not displayed. RADIOLOGY:  No results found. ASSESSMENT AND PLAN:  65 year old female admitted for acute on chronic systolic CHF.    * Acute on chronic systolic CHF exacerbation -Clinically doing much better - Changed  IV to PO  Lasix 40 mg twice a day strict I's and O's and daily weights - Continue Coreg 6.25 mg by mouth twice a day and taper Lasix to 40 mg once daily add spironolactone 12.5 mg once daily - BNP very elevated - Stopped lisinopril due to chronic cough - Neg 1.2 liters thus far -[facility] recommending outpatient follow-up with  [facility] primary cardiologist after discharge  * GERD - Continue Protonix  * Cough - She reports chronic cough -this could be related to ACE inhibitor , patient clinically feels better after stopping ACE inhibitor  - HIV antibodies negative and pertussis PCR negative -Flu test negative -ID consult placed   *  COPD - stable. Inhalers as needed - Likely viral URI.   continue Robitussin and Tessalon Perles. Clinically doing much better -  Off note, she just finished course of doxycycline for 10 days for same symptoms.  * Acute renal failure - Improved, monitor  *Consult social services regarding [employer], financial assistance  Anticipate discharge in a.m. patient does not have home keys  and family is out of town  All the records are reviewed and case discussed with Care Management/Social Worker. Management plans discussed with the patient, nursing and they are in agreement.  CODE STATUS: Full code, Palliative care consultation  TOTAL TIME TAKING CARE OF THIS PATIENT: 34 minutes.   More than 50% of the time was spent in counseling/coordination of care: YES  POSSIBLE D/C IN am DAYS, DEPENDING ON CLINICAL CONDITION.   Ramonita Lab M.D on 03/10/2016 at 2:32 PM  Between 7am to 6pm - Pager - [phone]  After 6pm go to [email or web address] - Scientist, research (life sciences) [facility]  Office  [phone]  CC: Primary care physician; No PCP Per Patient  Note: This dictation was prepared with Dragon dictation along with smaller phrase technology. Any transcriptional errors that result from this process are unintentional.

## 2016-03-11 LAB — GLUCOSE, CAPILLARY: GLUCOSE-CAPILLARY: 93 mg/dL (ref 65–99)

## 2016-03-11 LAB — BASIC METABOLIC PANEL
Anion gap: 11 (ref 5–15)
BUN: 57 mg/dL — AB (ref 6–20)
CO2: 29 mmol/L (ref 22–32)
CREATININE: 1 mg/dL (ref 0.44–1.00)
Calcium: 9.8 mg/dL (ref 8.9–10.3)
Chloride: 94 mmol/L — ABNORMAL LOW (ref 101–111)
GFR calc Af Amer: >60 mL/min (ref >60)
GFR, EST NON AFRICAN AMERICAN: 58 mL/min — AB (ref >60)
Glucose, Bld: 84 mg/dL (ref 65–99)
Potassium: 4.6 mmol/L (ref 3.5–5.1)
SODIUM: 134 mmol/L — AB (ref 135–145)

## 2016-03-11 MED ORDER — PANTOPRAZOLE SODIUM 40 MG PO TBEC: 40.0000 mg | DELAYED_RELEASE_TABLET | Freq: Every day | ORAL | 0 refills | Status: AC

## 2016-03-11 MED ORDER — DOCUSATE SODIUM 100 MG PO CAPS: 100.0000 mg | ORAL_CAPSULE | Freq: Every day | ORAL | 0 refills | Status: DC | PRN

## 2016-03-11 MED ORDER — ENSURE ENLIVE PO LIQD: 1.0000 | Freq: Three times a day (TID) | ORAL | 0 refills | Status: AC

## 2016-03-11 MED ORDER — FUROSEMIDE 40 MG PO TABS: 40.0000 mg | ORAL_TABLET | Freq: Every day | ORAL | 0 refills | Status: DC

## 2016-03-11 MED ORDER — SPIRONOLACTONE 25 MG PO TABS: 12.5000 mg | ORAL_TABLET | Freq: Every day | ORAL | 0 refills | Status: DC

## 2016-03-11 MED ORDER — CARVEDILOL 6.25 MG PO TABS: 6.2500 mg | ORAL_TABLET | Freq: Two times a day (BID) | ORAL | 0 refills | Status: DC

## 2016-03-11 MED ORDER — BENZONATATE 200 MG PO CAPS: 200.0000 mg | ORAL_CAPSULE | Freq: Two times a day (BID) | ORAL | 0 refills | Status: DC | PRN

## 2016-03-11 MED ORDER — ALBUTEROL SULFATE HFA 108 (90 BASE) MCG/ACT IN AERS: 2.0000 | INHALATION_SPRAY | Freq: Four times a day (QID) | RESPIRATORY_TRACT | 1 refills | Status: DC | PRN

## 2016-03-11 MED ORDER — ASPIRIN 81 MG PO TBEC: 81.0000 mg | DELAYED_RELEASE_TABLET | Freq: Every day | ORAL | Status: AC

## 2016-03-11 NOTE — Discharge Summary (Signed)
[facility] [place] at [facility]   PATIENT NAME: Barbara Oliver    MR#:  [id number]  DATE OF BIRTH:  November 24, 1950  DATE OF ADMISSION:  03/07/2016 ADMITTING PHYSICIAN: Delfino Lovett, MD  DATE OF DISCHARGE: 03/11/2016 PRIMARY CARE PHYSICIAN: No PCP Per Patient    ADMISSION DIAGNOSIS:  Acute pulmonary edema (HCC) [J81.0] Respiratory distress [R06.00] Acute on chronic systolic CHF (congestive heart failure) (HCC) [I50.23] Acute on chronic congestive heart failure, unspecified congestive heart failure type (HCC) [I50.9]  DISCHARGE DIAGNOSIS:  Active Problems:   CHF (congestive heart failure) (HCC)   Protein-calorie malnutrition, severe   SECONDARY DIAGNOSIS:   Past Medical History:  Diagnosis Date  . Acid reflux   . CHF (congestive heart failure) (HCC)   . Mitral regurgitation     HOSPITAL COURSE:  Brief history and physical  Barbara Oliver  is a 66 y.o. female with a known history of cardiomyopathy with an EF of 20-25%, chronic left bundle-branch block brought to the ED from home via EMS with a chief complaint of respiratory distress. Patient has not taken her Lasix for the past 3 days due to Financial difficulty. Complains of progressive shortness of breath throughout the night.  She required BiPAP while in the emergency room and subsequently has been on nasal cannula. She normally takes 40 mg daily along with spironolactone at 12.5 mg daily and carvedilol at 6.25 mg twice daily. Is unclear whether she has been compliant with the remainder of her medications either.  She says she is about 40% better. Chest x-ray on presentation revealed pulmonary vascular congestion. Electrocardiogram revealed sinus rhythm with left bundle branch block. Her serum troponin is unremarkable.. Please review history and physical for complete details and hospital course  Hospital course  * Acute on chronic systolic CHF exacerbation -Clinically improved with IV Lasix - Changed   IV to PO  Lasix 40 mg twice a day strict I's and O's and daily weights - Continue Coreg 6.25 mg by mouth twice a day and tapered Lasix to 40 mg once daily added spironolactone 12.5 mg once daily. Potassium supplements and given as the patient is started on spironolactone. PCP to follow up on the BMP - BNP very elevated at the time of admission - Stopped lisinopril due to chronic cough -followed by [facility] cardio, recommending outpatient follow-up with [facility] primary cardiologist after discharge  * GERD - Continue Protonix  * Cough - She reports chronic cough -this could be related to ACE inhibitor , patient clinically feels better after stopping ACE inhibitor  - HIV antibodies negative and pertussis PCR pending -Flu test negative - Likely viral URI. Clinically improved  continue Robitussin and Tessalon Perles prn - Off note, she just finished course of doxycycline for 10 days for same symptoms.  *Pulmonary nodules on CT chest CT chest results discussed with the patient Plan is to repeat CT chest in 6-12 months. Patient agreeable  * Acute renal failure - Improved, monitor  Case management assisted patient regarding [employer], financial assistance   DISCHARGE CONDITIONS:   stable  CONSULTS OBTAINED:  Treatment Team:  Alwyn Pea, MD Dalia Heading, MD Mick Sell, MD   PROCEDURES  none  DRUG ALLERGIES:  No Known Allergies  DISCHARGE MEDICATIONS:   Current Discharge Medication List    START taking these medications   Details  albuterol (PROVENTIL HFA;VENTOLIN HFA) 108 (90 Base) MCG/ACT inhaler Inhale 2 puffs into the lungs every 6 (six) hours as needed for wheezing or  shortness of breath. Qty: 1 Inhaler, Refills: 1    aspirin EC 81 MG EC tablet Take 1 tablet (81 mg total) by mouth daily.    docusate sodium (COLACE) 100 MG capsule Take 1 capsule (100 mg total) by mouth daily as needed for mild constipation. Qty: 10 capsule, Refills: 0    feeding  supplement, ENSURE ENLIVE, (ENSURE ENLIVE) LIQD Take 237 mLs by mouth 3 (three) times daily between meals. Qty: 90 Bottle, Refills: 0    guaiFENesin-dextromethorphan (ROBITUSSIN DM) 100-10 MG/5ML syrup Take 5 mLs by mouth every 4 (four) hours as needed for cough. Qty: 118 mL, Refills: 0    spironolactone (ALDACTONE) 25 MG tablet Take 0.5 tablets (12.5 mg total) by mouth daily. Qty: 30 tablet, Refills: 0      CONTINUE these medications which have CHANGED   Details  carvedilol (COREG) 6.25 MG tablet Take 1 tablet (6.25 mg total) by mouth 2 (two) times daily. Qty: 60 tablet, Refills: 0    furosemide (LASIX) 40 MG tablet Take 1 tablet (40 mg total) by mouth daily. Qty: 30 tablet, Refills: 0    pantoprazole (PROTONIX) 40 MG tablet Take 1 tablet (40 mg total) by mouth daily. Qty: 30 tablet, Refills: 0      STOP taking these medications     potassium chloride SA (K-DUR,KLOR-CON) 20 MEQ tablet      lisinopril (PRINIVIL,ZESTRIL) 2.5 MG tablet          DISCHARGE INSTRUCTIONS:   Follow-up with primary care physician and Dr. Lorin Picket community clinic in a week Follow-up with the [facility] cardiology-patient's primary cardiologist in a week-make appointment Follow-up with CHF clinic on January 4  DIET:  Cardiac diet  DISCHARGE CONDITION:  Stable  ACTIVITY:  Activity as tolerated  OXYGEN:  Home Oxygen: No.   Oxygen Delivery: room air  DISCHARGE LOCATION:  home   If you experience worsening of your admission symptoms, develop shortness of breath, life threatening emergency, suicidal or homicidal thoughts you must seek medical attention immediately by calling 911 or calling your MD immediately  if symptoms less severe.  You Must read complete instructions/literature along with all the possible adverse reactions/side effects for all the Medicines you take and that have been prescribed to you. Take any new Medicines after you have completely understood and accpet all the possible  adverse reactions/side effects.   Please note  You were cared for by a hospitalist during your hospital stay. If you have any questions about your discharge medications or the care you received while you were in the hospital after you are discharged, you can call the unit and asked to speak with the hospitalist on call if the hospitalist that took care of you is not available. Once you are discharged, your primary care physician will handle any further medical issues. Please note that NO REFILLS for any discharge medications will be authorized once you are discharged, as it is imperative that you return to your primary care physician (or establish a relationship with a primary care physician if you do not have one) for your aftercare needs so that they can reassess your need for medications and monitor your lab values.     Today  Chief Complaint  Patient presents with  . Respiratory Distress   Patient is doing fine. Cough significantly improved but still has some intermittent episodes of cough No overnight events wants to go home ROS:  CONSTITUTIONAL: Denies fevers, chills. Denies any fatigue, weakness.  EYES: Denies blurry vision, double vision,  eye pain. EARS, NOSE, THROAT: Denies tinnitus, ear pain, hearing loss. RESPIRATORY:   reports some cough, denies  wheeze, shortness of breath.  CARDIOVASCULAR: Denies chest pain, palpitations, edema.  GASTROINTESTINAL: Denies nausea, vomiting, diarrhea, abdominal pain. Denies bright red blood per rectum. GENITOURINARY: Denies dysuria, hematuria. ENDOCRINE: Denies nocturia or thyroid problems. HEMATOLOGIC AND LYMPHATIC: Denies easy bruising or bleeding. SKIN: Denies rash or lesion. MUSCULOSKELETAL: Denies pain in neck, back, shoulder, knees, hips or arthritic symptoms.  NEUROLOGIC: Denies paralysis, paresthesias.  PSYCHIATRIC: Denies anxiety or depressive symptoms.   VITAL SIGNS:  Blood pressure 116/69, pulse 88, temperature 97.4 F (36.3  C), temperature source Oral, resp. rate 18, height 5\' 10"  (1.778 m), weight 66.9 kg (147 lb 6.4 oz), SpO2 96 %.  I/O:    Intake/Output Summary (Last 24 hours) at 03/11/16 1334 Last data filed at 03/11/16 1100  Gross per 24 hour  Intake              834 ml  Output             2250 ml  Net            -1416 ml    PHYSICAL EXAMINATION:  GENERAL:  66 y.o.-year-old patient lying in the bed with no acute distress.  EYES: Pupils equal, round, reactive to light and accommodation. No scleral icterus. Extraocular muscles intact.  HEENT: Head atraumatic, normocephalic. Oropharynx and nasopharynx clear.  NECK:  Supple, no jugular venous distention. No thyroid enlargement, no tenderness.  LUNGS: Normal breath sounds bilaterally, no wheezing, rales,rhonchi or crepitation. No use of accessory muscles of respiration.  CARDIOVASCULAR: S1, S2 normal. No murmurs, rubs, or gallops.  ABDOMEN: Soft, non-tender, non-distended. Bowel sounds present. No organomegaly or mass.  EXTREMITIES: No pedal edema, cyanosis, or clubbing.  NEUROLOGIC: Cranial nerves II through XII are intact. Muscle strength 5/5 in all extremities. Sensation intact. Gait not checked.  PSYCHIATRIC: The patient is alert and oriented x 3.  SKIN: No obvious rash, lesion, or ulcer.   DATA REVIEW:   CBC  Recent Labs Lab 03/08/16 0554  WBC 8.8  HGB 12.4  HCT 37.5  PLT 419    Chemistries   Recent Labs Lab 03/07/16 0507  03/11/16 0418  NA 138  < > 134*  K 4.6  < > 4.6  CL 106  < > 94*  CO2 22  < > 29  GLUCOSE 221*  < > 84  BUN 30*  < > 57*  CREATININE 1.48*  < > 1.00  CALCIUM 8.9  < > 9.8  AST 73*  --   --   ALT 38  --   --   ALKPHOS 167*  --   --   BILITOT 0.7  --   --   < > = values in this interval not displayed.  Cardiac Enzymes  Recent Labs Lab 03/07/16 0507  TROPONINI 0.03*    Microbiology Results  Results for orders placed or performed during the hospital encounter of 03/07/16  Bordetella pertussis PCR      Status: None   Collection Time: 03/08/16 12:41 PM  Result Value Ref Range Status   B pertussis, DNA Negative Negative Final   B parapertussis, DNA Negative Negative Final    Comment: (NOTE) This test was developed and its performance characteristics determined by [employer]. It has not been cleared or approved by the U.S. Food and Drug Administration. The FDA has determined that such clearance or approval is not necessary. This test is  used for clinical purposes. It should not be regarded as investigational or research. Performed At: [facility] [address] [place], [place] [id number] Mila Homer MD YQ:[id number]     RADIOLOGY:  Ct Chest Wo Contrast  Result Date: 03/10/2016 CLINICAL DATA:  Cough for 1 year.  Congestive heart failure and COPD EXAM: CT CHEST WITHOUT CONTRAST TECHNIQUE: Multidetector CT imaging of the chest was performed following the standard protocol without IV contrast. COMPARISON:  None. FINDINGS: Cardiovascular: Coronary artery calcification and aortic atherosclerotic calcification. Mediastinum/Nodes: No axillary supraclavicular adenopathy. No mediastinal hilar adenopathy. Esophagus normal. Lungs/Pleura: RIGHT middle lobe nodule abutting the pleural surface measures 7 mm (image 89, series 3). Smaller nodule in the superior segment of the RIGHT lower lobe measures 3 mm (image 52, series 3). Focal bronchiectasis in the LEFT lung base on image 104, series 3. Upper Abdomen: Limited view of the liver, kidneys, pancreas are unremarkable. Normal adrenal glands. Musculoskeletal: No aggressive osseous lesion. IMPRESSION: 1. **An incidental finding of potential clinical significance has been found. Two nodules within the RIGHT lung the larger measuring 7 mm. Non-contrast chest CT at 6-12 months is recommended. If the nodule is stable at time of repeat CT, then future CT at 18-24 months (from today's scan) is considered optional for low-risk patients, but  is recommended for high-risk patients. This recommendation follows the consensus statement: Guidelines for Management of Incidental Pulmonary Nodules Detected on CT Images: From the Fleischner Society 2017; Radiology 2017; 284:228-243. ** 2. Coronary artery calcification and aortic atherosclerotic calcification. Electronically Signed   By: Genevive Bi M.D.   On: 03/10/2016 18:01    EKG:   Orders placed or performed during the hospital encounter of 03/07/16  . ED EKG  . ED EKG  . EKG 12-Lead  . EKG 12-Lead      Management plans discussed with the patient, family and they are in agreement.  CODE STATUS:     Code Status Orders        Start     Ordered   03/07/16 0742  Full code  Continuous     03/07/16 0742    Code Status History    Date Active Date Inactive Code Status Order ID Comments User Context   10/06/2015  8:08 AM 10/06/2015  2:56 PM Full Code [id number]  Angelita Ingles, RN Inpatient   10/05/2015  2:27 AM 10/05/2015  7:45 AM Full Code [id number]  Oralia Manis, MD Inpatient      TOTAL TIME TAKING CARE OF THIS PATIENT: 45 minutes.   Note: This dictation was prepared with Dragon dictation along with smaller phrase technology. Any transcriptional errors that result from this process are unintentional.   @[facility] @  on 03/11/2016 at 1:34 PM  Between 7am to 6pm - Pager - [phone]  After 6pm go to [email or web address] - password [email or web address] [facility]  [facility]  Office  [phone]  CC: Primary care physician; No PCP Per Patient

## 2016-03-11 NOTE — Discharge Instructions (Signed)
Heart Failure Clinic appointment on March 14, 2016 at 11:30am with Clarisa Kindred, FNP. Please call [phone] to reschedule.  Follow-up with primary care physician and Dr. Lorin Picket community clinic in a week Follow-up with the [facility] cardiology-patient's primary cardiologist in a week Follow-up with CHF clinic on January 4

## 2016-03-11 NOTE — Care Management (Signed)
Faxed additional new scripts to [facility]- ASA 81mg , albuterol prn inhaler and daily aldactone.  Patient has had her Aldactone dose today.  She verbalizes understanding that she will have to go to the clinic to pick up those scripts.  CM retrieved the discontinued lisinopril which was discontinued due to cough.  Provided patient another application to [facility] as patient lost the first one when she was moved to another room.  Discussed the need to follow through with application process. Does not require oxygen

## 2016-03-11 NOTE — Progress Notes (Signed)
Discharge instructions along with home medication list and follow up gone over with patient. Printed rx given to patient, patient was also given filled prescriptions provided by case management. Patient verbalized that she understood instructions. Iv and telemetry removed. No c/o pain no distress. Patient will go downstairs to wait for ride in the lounge.

## 2016-03-14 ENCOUNTER — Ambulatory Visit: Payer: Self-pay | Admitting: Family

## 2016-03-29 ENCOUNTER — Ambulatory Visit: Payer: Self-pay

## 2016-04-11 ENCOUNTER — Ambulatory Visit: Payer: Self-pay | Admitting: Pharmacy Technician

## 2016-04-11 NOTE — Progress Notes (Signed)
Patient scheduled for eligibility appointment at [facility].  Patient did not show for the appointment on 04/11/16 at 10:30a.m.  Patient also did not show for appointment on 03/29/16 at 9:00a.m.  Patient did not reschedule eligibility appointment.  No additional medication assistance can be provided until proof of income is provide by patient.  Sherilyn Dacosta Care Manager [facility]

## 2016-04-24 ENCOUNTER — Encounter (INDEPENDENT_AMBULATORY_CARE_PROVIDER_SITE_OTHER): Payer: Self-pay

## 2016-04-24 ENCOUNTER — Ambulatory Visit: Payer: Self-pay | Admitting: Pharmacy Technician

## 2016-04-24 DIAGNOSIS — Z79899 Other long term (current) drug therapy: Secondary | ICD-10-CM

## 2016-04-24 NOTE — Progress Notes (Addendum)
Patient scheduled for eligibility appointment at [facility].  This is the second no show for patient.  Attempted to call patient to inquire as to why a no show again for appointment.  Got answering machine.  Left patient a message to call me.  Patient showed-up for appointment an hour late.  Completed eligibility appointment with patient.  Completed application and contract.  Patient agreed to all terms on contract.  Patient does not have a primary care provider.  Referred patient to [facility].  Patient stated that she has been leaving messages at [facility] for around 3 weeks and has not received a return phone call.  Will make [facility] aware.    Patient indicated that she needed a refill for Albuterol inhaler.  No refills.  Explained to patient that we had to get her established with a provider to write a prescription so that we could fill and dispense the Albuterol inhaler.  Also, offered to make an appointment with [facility] so that patient could purchase an inhaler at a reduced cost until we are able to obtain the Albuterol inhaler from the pharmaceutical company.  Patient refused.  Wants to go to [facility].  Patient is eligible for Medicare parts A, B & D in November of 2018.  Made patient aware that [facility] will not be able to provide medication assistance beginning 01/09/2017 since patient is eligible for Medicare parts A, B & D.  Also, explained to patient how to be screened for a Medicare Savings Plan and L.I.S. (Low-Income Subsidy). Patient acknowledged that she understood.  Patient still to provide 2018 Social Security Award Letter and [employer].  Once proof of income information is provided, patient will be approved to receive medication assistance through January 08, 2017.  Patient understands that she is to notify [facility] of there are any changes to income or insurance prior to October 31. 2018.  Sherilyn Dacosta Care Manager [facility]  Sherilyn Dacosta Care Manager [facility]  Sherilyn Dacosta Care Manager [facility]

## 2016-05-22 ENCOUNTER — Telehealth: Payer: Self-pay | Admitting: Pharmacy Technician

## 2016-05-22 NOTE — Telephone Encounter (Signed)
Patient called in refills.  Stated that she is now living in [place].  Does not have a doctor.  Explained that patient must be a resident of [facility] or be seeing a doctor in [place] to receive medication assistance from [facility].  Spoke with Amy at [facility] to arrange an appt for patient.  Patient is to call [facility] on 06/10/16 to arrange appointment.  Patient is to speak with pharmacy at [facility] to inquire about obtaining medications prior to eligibility appointment.    Sherilyn Dacosta Care Manager [facility]

## 2016-07-10 ENCOUNTER — Telehealth: Payer: Self-pay | Admitting: Pharmacy Technician

## 2016-07-10 NOTE — Telephone Encounter (Signed)
Phone number provided by patient has been disconnected.  Sending letter to patient making patient aware that she has been made inactive until we receive proof that she resides in [facility] or is seeing an [facility] provider.  We also need 2018 proof of income.  Sherilyn Dacosta Care Manager [facility]

## 2016-08-14 ENCOUNTER — Telehealth: Payer: Self-pay | Admitting: Pharmacy Technician

## 2016-08-14 NOTE — Telephone Encounter (Signed)
Patient presented requesting refills for RX release medications on file.  Refills provided to patient.  Explained to patient that after filling  medications there are no more refills on these medications, and that she needs to be connected to a primary care provider.  Patient is still living in [place].  Patient was to see a provider at [facility] in [place] in April.  Patient cancelled appointment.  Patient stated that she is going to contact [employer] and [facility] again to arrange for an appointment. Also, told patient that [facility] will be unable to provide additional medication unless she is being treated by an [facility] provider, since she is now living in [place].  Patient acknowledged that she understood.  Sherilyn Dacosta Care Manager [facility]

## 2016-10-28 ENCOUNTER — Telehealth: Payer: Self-pay | Admitting: Pharmacy Technician

## 2016-10-28 NOTE — Telephone Encounter (Signed)
Patient called inquiring about [facility] refilling Furosemide and Carvedilol.  Explained to patient that there are no refills for this medication.  Prescriptions that were received for these medications came from ED.  Explained to patient that she needs to get connected with provider.  Patient indicated that [facility] is not accepting new patients.  I attempted to contact the [facility] in [place] regarding making an appointment for patient while patient still on phone.  Spoke with Karren Burly at [facility].  When I attempted to bring patient in on conversation, patient had hung-up.  Provided Karren Burly with patient's phone number.  Karren Burly to follow-up with patient regarding appointment.  Sherilyn Dacosta Care Manager [facility]

## 2016-11-12 ENCOUNTER — Ambulatory Visit: Payer: Medicaid Other | Admitting: Family Medicine

## 2016-11-20 ENCOUNTER — Emergency Department: Payer: Medicaid Other

## 2016-11-20 ENCOUNTER — Encounter: Payer: Self-pay | Admitting: *Deleted

## 2016-11-20 ENCOUNTER — Inpatient Hospital Stay
Admission: EM | Admit: 2016-11-20 | Discharge: 2016-11-23 | DRG: 291 | Disposition: A | Discharge status: Home / Self Care | Payer: Medicaid Other | Attending: Internal Medicine | Admitting: Internal Medicine

## 2016-11-20 ENCOUNTER — Inpatient Hospital Stay
Admit: 2016-11-20 | Discharge: 2016-11-20 | Disposition: A | Discharge status: Home / Self Care | Payer: Medicaid Other | Attending: Internal Medicine | Admitting: Internal Medicine

## 2016-11-20 DIAGNOSIS — I130 Hypertensive heart and chronic kidney disease with heart failure and stage 1 through stage 4 chronic kidney disease, or unspecified chronic kidney disease: Secondary | ICD-10-CM | POA: Diagnosis present

## 2016-11-20 DIAGNOSIS — E875 Hyperkalemia: Secondary | ICD-10-CM | POA: Diagnosis not present

## 2016-11-20 DIAGNOSIS — M79602 Pain in left arm: Secondary | ICD-10-CM

## 2016-11-20 DIAGNOSIS — I340 Nonrheumatic mitral (valve) insufficiency: Secondary | ICD-10-CM | POA: Diagnosis present

## 2016-11-20 DIAGNOSIS — N183 Chronic kidney disease, stage 3 (moderate): Secondary | ICD-10-CM | POA: Diagnosis present

## 2016-11-20 DIAGNOSIS — Z7982 Long term (current) use of aspirin: Secondary | ICD-10-CM

## 2016-11-20 DIAGNOSIS — Z8249 Family history of ischemic heart disease and other diseases of the circulatory system: Secondary | ICD-10-CM | POA: Diagnosis not present

## 2016-11-20 DIAGNOSIS — K219 Gastro-esophageal reflux disease without esophagitis: Secondary | ICD-10-CM | POA: Diagnosis present

## 2016-11-20 DIAGNOSIS — F17210 Nicotine dependence, cigarettes, uncomplicated: Secondary | ICD-10-CM | POA: Diagnosis present

## 2016-11-20 DIAGNOSIS — I5021 Acute systolic (congestive) heart failure: Secondary | ICD-10-CM

## 2016-11-20 DIAGNOSIS — I5023 Acute on chronic systolic (congestive) heart failure: Secondary | ICD-10-CM | POA: Diagnosis present

## 2016-11-20 DIAGNOSIS — Z9119 Patient's noncompliance with other medical treatment and regimen: Secondary | ICD-10-CM

## 2016-11-20 DIAGNOSIS — I447 Left bundle-branch block, unspecified: Secondary | ICD-10-CM | POA: Diagnosis present

## 2016-11-20 DIAGNOSIS — R079 Chest pain, unspecified: Secondary | ICD-10-CM

## 2016-11-20 DIAGNOSIS — I509 Heart failure, unspecified: Secondary | ICD-10-CM

## 2016-11-20 DIAGNOSIS — I420 Dilated cardiomyopathy: Secondary | ICD-10-CM | POA: Diagnosis present

## 2016-11-20 DIAGNOSIS — R0789 Other chest pain: Secondary | ICD-10-CM | POA: Diagnosis present

## 2016-11-20 LAB — CBC
HCT: 40 % (ref 35.0–47.0)
HEMOGLOBIN: 13.5 g/dL (ref 12.0–16.0)
MCH: 30.8 pg (ref 26.0–34.0)
MCHC: 33.9 g/dL (ref 32.0–36.0)
MCV: 90.9 fL (ref 80.0–100.0)
Platelets: 372 10*3/uL (ref 150–440)
RBC: 4.4 MIL/uL (ref 3.80–5.20)
RDW: 15.4 % — ABNORMAL HIGH (ref 11.5–14.5)
WBC: 6.6 10*3/uL (ref 3.6–11.0)

## 2016-11-20 LAB — BASIC METABOLIC PANEL
ANION GAP: 8 (ref 5–15)
BUN: 28 mg/dL — ABNORMAL HIGH (ref 6–20)
CALCIUM: 9.8 mg/dL (ref 8.9–10.3)
CO2: 25 mmol/L (ref 22–32)
Chloride: 102 mmol/L (ref 101–111)
Creatinine, Ser: 1.33 mg/dL — ABNORMAL HIGH (ref 0.44–1.00)
GFR, EST AFRICAN AMERICAN: 48 mL/min — AB (ref >60)
GFR, EST NON AFRICAN AMERICAN: 41 mL/min — AB (ref >60)
Glucose, Bld: 84 mg/dL (ref 65–99)
POTASSIUM: 4.3 mmol/L (ref 3.5–5.1)
SODIUM: 135 mmol/L (ref 135–145)

## 2016-11-20 LAB — TROPONIN I
Troponin I: <0.03 ng/mL (ref <0.03)
Troponin I: <0.03 ng/mL (ref <0.03)
Troponin I: <0.03 ng/mL (ref <0.03)

## 2016-11-20 LAB — BRAIN NATRIURETIC PEPTIDE: B NATRIURETIC PEPTIDE 5: 1272 pg/mL — AB (ref 0.0–100.0)

## 2016-11-20 MED ORDER — SODIUM CHLORIDE 0.9% FLUSH: 3.0000 mL | INTRAVENOUS | Status: DC | PRN

## 2016-11-20 MED ORDER — SODIUM CHLORIDE 0.9 % IV SOLN: 250.0000 mL | INTRAVENOUS | Status: DC | PRN

## 2016-11-20 MED ORDER — ENOXAPARIN SODIUM 40 MG/0.4ML ~~LOC~~ SOLN
40.0000 mg | SUBCUTANEOUS | Status: DC
  Administered 2016-11-20 – 2016-11-22 (×3): 40 mg via SUBCUTANEOUS
  Filled 2016-11-20 (×3): qty 0.4

## 2016-11-20 MED ORDER — CARVEDILOL 6.25 MG PO TABS
6.2500 mg | ORAL_TABLET | Freq: Two times a day (BID) | ORAL | Status: DC
  Administered 2016-11-20 – 2016-11-23 (×6): 6.25 mg via ORAL
  Filled 2016-11-20 (×6): qty 1

## 2016-11-20 MED ORDER — ACETAMINOPHEN 325 MG PO TABS: 650.0000 mg | ORAL_TABLET | ORAL | Status: DC | PRN

## 2016-11-20 MED ORDER — ALBUTEROL SULFATE (2.5 MG/3ML) 0.083% IN NEBU: 2.5000 mg | INHALATION_SOLUTION | Freq: Four times a day (QID) | RESPIRATORY_TRACT | Status: DC | PRN

## 2016-11-20 MED ORDER — DOCUSATE SODIUM 100 MG PO CAPS: 100.0000 mg | ORAL_CAPSULE | Freq: Every day | ORAL | Status: DC | PRN

## 2016-11-20 MED ORDER — FUROSEMIDE 10 MG/ML IJ SOLN
INTRAMUSCULAR | Status: AC
  Administered 2016-11-20: 40 mg via INTRAVENOUS
  Filled 2016-11-20: qty 4

## 2016-11-20 MED ORDER — SODIUM CHLORIDE 0.9% FLUSH
3.0000 mL | Freq: Two times a day (BID) | INTRAVENOUS | Status: DC
Start: 2016-11-20 — End: 2016-11-23
  Administered 2016-11-20 – 2016-11-23 (×6): 3 mL via INTRAVENOUS

## 2016-11-20 MED ORDER — FUROSEMIDE 10 MG/ML IJ SOLN
40.0000 mg | Freq: Once | INTRAMUSCULAR | Status: AC
  Administered 2016-11-20: 40 mg via INTRAVENOUS

## 2016-11-20 MED ORDER — ONDANSETRON HCL 4 MG/2ML IJ SOLN: 4.0000 mg | Freq: Four times a day (QID) | INTRAMUSCULAR | Status: DC | PRN

## 2016-11-20 MED ORDER — ASPIRIN EC 81 MG PO TBEC
81.0000 mg | DELAYED_RELEASE_TABLET | Freq: Every day | ORAL | Status: DC
Start: 2016-11-20 — End: 2016-11-23
  Administered 2016-11-20 – 2016-11-23 (×4): 81 mg via ORAL
  Filled 2016-11-20 (×4): qty 1

## 2016-11-20 MED ORDER — SPIRONOLACTONE 25 MG PO TABS
12.5000 mg | ORAL_TABLET | Freq: Every day | ORAL | Status: DC
  Administered 2016-11-20 – 2016-11-23 (×4): 12.5 mg via ORAL
  Filled 2016-11-20 (×4): qty 1

## 2016-11-20 MED ORDER — GUAIFENESIN-DM 100-10 MG/5ML PO SYRP
5.0000 mL | ORAL_SOLUTION | ORAL | Status: DC | PRN
  Administered 2016-11-21: 5 mL via ORAL
  Filled 2016-11-20: qty 5

## 2016-11-20 MED ORDER — FAMOTIDINE 20 MG PO TABS
20.0000 mg | ORAL_TABLET | Freq: Every day | ORAL | Status: DC
  Administered 2016-11-20 – 2016-11-23 (×4): 20 mg via ORAL
  Filled 2016-11-20 (×4): qty 1

## 2016-11-20 MED ORDER — FUROSEMIDE 10 MG/ML IJ SOLN
40.0000 mg | Freq: Two times a day (BID) | INTRAMUSCULAR | Status: DC
  Administered 2016-11-21 – 2016-11-22 (×3): 40 mg via INTRAVENOUS
  Filled 2016-11-20 (×3): qty 4

## 2016-11-20 MED ORDER — NICOTINE 7 MG/24HR TD PT24
7.0000 mg | MEDICATED_PATCH | Freq: Every day | TRANSDERMAL | Status: DC
  Administered 2016-11-20 – 2016-11-23 (×4): 7 mg via TRANSDERMAL
  Filled 2016-11-20 (×5): qty 1

## 2016-11-20 MED ORDER — PANTOPRAZOLE SODIUM 40 MG PO TBEC
40.0000 mg | DELAYED_RELEASE_TABLET | Freq: Every day | ORAL | Status: DC
  Administered 2016-11-20 – 2016-11-23 (×4): 40 mg via ORAL
  Filled 2016-11-20 (×5): qty 1

## 2016-11-20 NOTE — ED Provider Notes (Signed)
[facility] Emergency Department Provider Note  ____________________________________________   First MD Initiated Contact with Patient 11/20/16 1453     (approximate)  I have reviewed the triage vital signs and the nursing notes.   HISTORY  Chief Complaint Chest Pain and Numbness    HPI Barbara Oliver is a 66 y.o. female with a history of heart failure with an estimated ejection fraction of about 20% and who does not have any outpatient physicians who presents for evaluation of acute onset of left-sided chest pain radiating down into her left arm.  She reports that it is throbbing in nature and worse with exertion and also include some throbbing left-sided chest pain.  She also is having greater shortness of breath than usual particularly with exertion.  Rest makes her symptoms a little bit worse but her chest pain continues.  In addition to the throbbing pain shows reports some numbness in the arm.  The symptoms started about 7 hours prior to my evaluation of the patient.  She has had no weakness in any extremities.  She reports a mild headache over the last 2 days but denies any imbalance with ambulation.she says that the shortness of breath feels similar to when she is having extra fluid on her lungs but she has never had the chest pain before, particularly with the radiating into her arm.  She reports that she has been taking carvedilol and furosemide but states she does not take any other medications (record indicates she might have been taking spironolactone).  She denies fever/chills, nausea, vomiting, abdominal pain.  She has had a mildly productive cough over the last few days as well.  She smokes but states it is only 1 or 2 cigarettes a day.   Past Medical History:  Diagnosis Date  . Acid reflux   . CHF (congestive heart failure) (HCC)   . Mitral regurgitation     Patient Active Problem List   Diagnosis Date Noted  . Acute CHF (congestive heart  failure) (HCC) 11/20/2016  . CHF (congestive heart failure) (HCC) 03/07/2016  . Protein-calorie malnutrition, severe 03/07/2016  . Abdominal pain, epigastric 10/06/2015  . Elevated transaminase level 10/06/2015  . Acute renal insufficiency 10/06/2015  . Hyperglycemia 10/06/2015  . Severe mitral regurgitation 10/06/2015  . 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 Acute on chronic systolic CHF (congestive heart failure) (HCC) 10/05/2015    Past Surgical History:  Procedure Laterality Date  . HERNIA REPAIR      Prior to Admission medications   Medication Sig Start Date End Date Taking? Authorizing Provider  aspirin EC 81 MG EC tablet Take 1 tablet (81 mg total) by mouth daily. 03/12/16  Yes Gouru, Deanna Artis, MD  carvedilol (COREG) 6.25 MG tablet Take 1 tablet (6.25 mg total) by mouth 2 (two) times daily. 03/11/16  Yes Gouru, Deanna Artis, MD  furosemide (LASIX) 40 MG tablet Take 1 tablet (40 mg total) by mouth daily. 03/11/16  Yes Gouru, Deanna Artis, MD  pantoprazole (PROTONIX) 40 MG tablet Take 1 tablet (40 mg total) by mouth daily. 03/11/16  Yes Gouru, Deanna Artis, MD  spironolactone (ALDACTONE) 25 MG tablet Take 0.5 tablets (12.5 mg total) by mouth daily. 03/12/16  Yes Gouru, Deanna Artis, MD  albuterol (PROVENTIL HFA;VENTOLIN HFA) 108 (90 Base) MCG/ACT inhaler Inhale 2 puffs into the lungs every 6 (six) hours as needed for wheezing or shortness of breath. Patient not taking: Reported on 11/20/2016 03/11/16   Ramonita Lab, MD  docusate sodium (COLACE) 100 MG capsule Take  1 capsule (100 mg total) by mouth daily as needed for mild constipation. Patient not taking: Reported on 11/20/2016 03/11/16   Ramonita Lab, MD  guaiFENesin-dextromethorphan (ROBITUSSIN DM) 100-10 MG/5ML syrup Take 5 mLs by mouth every 4 (four) hours as needed for cough. Patient not taking: Reported on 11/20/2016 03/07/16   Delfino Lovett, MD    Allergies Patient has no known allergies.  Family History  Problem Relation Age of Onset  . Hypertension  Mother   . Heart attack Father     Social History Social History  Substance Use Topics  . Smoking status: Current Some Day Smoker    Packs/day: 0.00    Types: Cigarettes  . Smokeless tobacco: Never Used  . Alcohol use No    Review of Systems Constitutional: No fever/chills Eyes: No visual changes. ENT: No sore throat. Cardiovascular: acute onset left-sided chest pain radiating down into her left arm Respiratory: shortness of breath worse with exertion, cough productive of clear mucus Gastrointestinal: No abdominal pain.  No nausea, no vomiting.  No diarrhea.  No constipation. Genitourinary: Negative for dysuria. Musculoskeletal: Negative for neck pain.  Negative for back pain. Integumentary: Negative for rash. Neurological: mild headache for 2 days.  Some numbness along with a throbbing pain radiating down her left arm.  No focal weakness.  ____________________________________________   PHYSICAL EXAM:  VITAL SIGNS: ED Triage Vitals [11/20/16 1240]  Enc Vitals Group     BP 140/90     Pulse Rate 93     Resp 16     Temp 98.3 F (36.8 C)     Temp Source Oral     SpO2 100 %     Weight 66.7 kg (147 lb)     Height 1.854 m ( )     Head Circumference      Peak Flow      Pain Score 7     Pain Loc      Pain Edu?      Excl. in GC?     Constitutional: Alert and oriented. Well appearing and in no acute distress. Eyes: Conjunctivae are normal.  Head: Atraumatic. Nose: No congestion/rhinnorhea. Mouth/Throat: Mucous membranes are moist. Neck: No stridor.  No meningeal signs.   Cardiovascular: Normal rate, regular rhythm. Good peripheral circulation. Grossly normal heart sounds. Respiratory: Normal respiratory effort.  No retractions. Lungs CTAB. Gastrointestinal: Soft and nontender. No distention.  Musculoskeletal: No lower extremity tenderness nor edema. No gross deformities of extremities. Neurologic:  Normal speech and language. No gross focal neurologic deficits  are appreciated.  Skin:  Skin is warm, dry and intact. No rash noted. Psychiatric: Mood and affect are normal. Speech and behavior are normal.  ____________________________________________   LABS (all labs ordered are listed, but only abnormal results are displayed)  Labs Reviewed  BASIC METABOLIC PANEL - Abnormal; Notable for the following:       Result Value   BUN 28 (*)    Creatinine, Ser 1.33 (*)    GFR calc non Af Amer 41 (*)    GFR calc Af Amer 48 (*)    All other components within normal limits  CBC - Abnormal; Notable for the following:    RDW 15.4 (*)    All other components within normal limits  BRAIN NATRIURETIC PEPTIDE - Abnormal; Notable for the following:    B Natriuretic Peptide 1,272.0 (*)    All other components within normal limits  TROPONIN I  TROPONIN I  TROPONIN I  TROPONIN I  BASIC METABOLIC PANEL   ____________________________________________  EKG  ED ECG REPORT I, Delisa Finck, the attending physician, personally viewed and interpreted this ECG.  Date: 11/20/2016 EKG Time: 12:28 PM Rate: 91 Rhythm: normal sinus rhythm QRS Axis: left axis deviation Intervals: LBBB ST/T Wave abnormalities: Non-specific ST segment / T-wave changes, but no evidence of acute ischemia. Narrative Interpretation: similar morphology to prior but with more pronounced T waves, more pronounced T-wave inversion in lead V6 in particular, some change morphology in V2 and V3.  Unknown significance but does not meet criteria for STEMI. verified in the medical record that the left bundle branch block is chronic   ____________________________________________  RADIOLOGY   Dg Chest 2 View  Result Date: 11/20/2016 CLINICAL DATA:  Shortness of breath over the past 2 days. No chest pain but some left arm tingling was noted. History of CHF, mitral regurgitation, current smoker. EXAM: CHEST  2 VIEW COMPARISON:  Chest x-ray of March 07, 2016 FINDINGS: The lungs are well-expanded.  The interstitial markings are coarse. There is no alveolar infiltrate or pleural effusion. The cardiac silhouette is enlarged. The pulmonary vascularity is mildly prominent centrally. There is calcification in the wall of the thoracic aorta. The bony thorax is unremarkable. IMPRESSION: Chronic bronchitic changes, stable. Cardiomegaly with mild central pulmonary vascular congestion consistent with low-grade compensated CHF. Thoracic aortic atherosclerosis. Electronically Signed   By: David  [place]az[place] M.D.   On: 11/20/2016 13:07    ____________________________________________   PROCEDURES  Critical Care performed: No   Procedure(s) performed:   Procedures   ____________________________________________   INITIAL IMPRESSION / ASSESSMENT AND PLAN / ED COURSE  Pertinent labs & imaging results that were available during my care of the patient were reviewed by me and considered in my medical decision making (see chart for details).  the patient is having new, acute onset chest pain on the left side and radiating down her left arm with some numbness in the arm.  This is associated with shortness of breath greater than baseline, and all of her symptoms are worse with exertion.  Most likely this is related to volume status, but I am concerned it could also represent ACS.  We note that last she was evaluated she has a poor ejection fraction of about 20% and she has no follow-up options.  In the past, she has not followed up in the heart failure clinic due to financial reasons in spite of being scheduled for an appointment ( I saw note about this in the EMR).  I am afraid she is high-risk chest pain, and additionally, her creatinine is up and her GFR is down which makes it difficult to adequately or appropriately diurese her safely as an outpatient with pulmonary vascular congestion on her chest x-ray.  I have added on a BNP but I suspect she would benefit from admission for evaluation by cardiology/heart  failure, serial enzymes, and volume balance.  Clinical Course as of Nov 21 1738  Wed Nov 20, 2016  1546 significantly elevated BNP, SOB with exertion, new onset chest pain.  Ordering Lasix 40 mg IV and contacting hospitalist for admission. B Natriuretic Peptide: (!) 1,272.0 [CF]    Clinical Course User Index [CF] Loleta Rose, MD    ____________________________________________  FINAL CLINICAL IMPRESSION(S) / ED DIAGNOSES  Final diagnoses:  Chest pain, unspecified type  Acute on chronic congestive heart failure, unspecified heart failure type (HCC)  Left arm pain     MEDICATIONS GIVEN DURING THIS VISIT:  Medications  guaiFENesin-dextromethorphan (  ROBITUSSIN DM) 100-10 MG/5ML syrup 5 mL (not administered)  aspirin EC tablet 81 mg (81 mg Oral Given 11/20/16 1736)  spironolactone (ALDACTONE) tablet 12.5 mg (12.5 mg Oral Given 11/20/16 1737)  pantoprazole (PROTONIX) EC tablet 40 mg (40 mg Oral Given 11/20/16 1736)  carvedilol (COREG) tablet 6.25 mg (not administered)  docusate sodium (COLACE) capsule 100 mg (not administered)  albuterol (PROVENTIL) (2.5 MG/3ML) 0.083% nebulizer solution 2.5 mg (not administered)  sodium chloride flush (NS) 0.9 % injection 3 mL (not administered)  sodium chloride flush (NS) 0.9 % injection 3 mL (not administered)  0.9 %  sodium chloride infusion (not administered)  acetaminophen (TYLENOL) tablet 650 mg (not administered)  ondansetron (ZOFRAN) injection 4 mg (not administered)  enoxaparin (LOVENOX) injection 40 mg (not administered)  furosemide (LASIX) injection 40 mg (not administered)  famotidine (PEPCID) tablet 20 mg (20 mg Oral Given 11/20/16 1736)  nicotine (NICODERM CQ - dosed in mg/24 hr) patch 7 mg (not administered)  furosemide (LASIX) injection 40 mg (40 mg Intravenous Given 11/20/16 1553)     NEW OUTPATIENT MEDICATIONS STARTED DURING THIS VISIT:  Current Discharge Medication List      Current Discharge Medication List       Current Discharge Medication List       Note:  This document was prepared using Dragon voice recognition software and may include unintentional dictation errors.    Loleta Rose, MD 11/20/16 1740

## 2016-11-20 NOTE — H&P (Signed)
[facility] [place] at [facility]   PATIENT NAME: Barbara Oliver    MR#:  [id number]  DATE OF BIRTH:  1951/01/02  DATE OF ADMISSION:  11/20/2016  PRIMARY CARE PHYSICIAN: Patient, No Pcp Per   REQUESTING/REFERRING PHYSICIAN: [place]  CHIEF COMPLAINT:  Chest pain and sob  HISTORY OF PRESENT ILLNESS:  Barbara Oliver  is a 66 y.o. female with a known history of chronic systolic congestive heart failure not seen by any cardiologist recently, cardiomyopathy, essential hypertension and GERD is presenting to the ED with a chief complaint of chest pain or shortness of breath.patient reports chest pain is on the left side of her chest radiating to the back. Feeling shortness of breath but denies any pedal edema, thinks her home dose Lasix is not helping her. Chest x-ray with a pulmonary edema and BNP is elevated. Patient was given IV Lasix and hospitalist team is called to admit the patient  PAST MEDICAL HISTORY:   Past Medical History:  Diagnosis Date  . Acid reflux   . CHF (congestive heart failure) (HCC)   . Mitral regurgitation     PAST SURGICAL HISTOIRY:   Past Surgical History:  Procedure Laterality Date  . HERNIA REPAIR      SOCIAL HISTORY:   Social History  Substance Use Topics  . Smoking status: Current Some Day Smoker    Packs/day: 0.00    Types: Cigarettes  . Smokeless tobacco: Never Used  . Alcohol use No    FAMILY HISTORY:   Family History  Problem Relation Age of Onset  . Hypertension Mother   . Heart attack Father     DRUG ALLERGIES:  No Known Allergies  REVIEW OF SYSTEMS:  CONSTITUTIONAL: No fever, fatigue or weakness.  EYES: No blurred or double vision.  EARS, NOSE, AND THROAT: No tinnitus or ear pain.  RESPIRATORY: No cough, shortness of breath, wheezing or hemoptysis.  CARDIOVASCULAR:reporting left sided chest wall pain and left back pain associated with shortness of breath but no lower extremity swelling   GASTROINTESTINAL: No nausea, vomiting, diarrhea or abdominal pain.  GENITOURINARY: No dysuria, hematuria.  ENDOCRINE: No polyuria, nocturia,  HEMATOLOGY: No anemia, easy bruising or bleeding SKIN: No rash or lesion. MUSCULOSKELETAL: No joint pain or arthritis.   NEUROLOGIC: No tingling, numbness, weakness.  PSYCHIATRY: No anxiety or depression.   MEDICATIONS AT HOME:   Prior to Admission medications   Medication Sig Start Date End Date Taking? Authorizing Provider  albuterol (PROVENTIL HFA;VENTOLIN HFA) 108 (90 Base) MCG/ACT inhaler Inhale 2 puffs into the lungs every 6 (six) hours as needed for wheezing or shortness of breath. 03/11/16   Ramonita Lab, MD  aspirin EC 81 MG EC tablet Take 1 tablet (81 mg total) by mouth daily. 03/12/16   Ramonita Lab, MD  carvedilol (COREG) 6.25 MG tablet Take 1 tablet (6.25 mg total) by mouth 2 (two) times daily. 03/11/16   Ramonita Lab, MD  docusate sodium (COLACE) 100 MG capsule Take 1 capsule (100 mg total) by mouth daily as needed for mild constipation. 03/11/16   Ramonita Lab, MD  furosemide (LASIX) 40 MG tablet Take 1 tablet (40 mg total) by mouth daily. 03/11/16   Napoleon Monacelli, Deanna Artis, MD  guaiFENesin-dextromethorphan (ROBITUSSIN DM) 100-10 MG/5ML syrup Take 5 mLs by mouth every 4 (four) hours as needed for cough. 03/07/16   Delfino Lovett, MD  pantoprazole (PROTONIX) 40 MG tablet Take 1 tablet (40 mg total) by mouth daily. 03/11/16   Ramonita Lab, MD  spironolactone (  ALDACTONE) 25 MG tablet Take 0.5 tablets (12.5 mg total) by mouth daily. 03/12/16   Ramonita Lab, MD      VITAL SIGNS:  Blood pressure 125/78, pulse 84, temperature 98.3 F (36.8 C), temperature source Oral, resp. rate 16, height  (1.854 m), weight 66.7 kg (147 lb), SpO2 95 %.  PHYSICAL EXAMINATION:  GENERAL:  66 y.o.-year-old patient lying in the bed with no acute distress.  EYES: Pupils equal, round, reactive to light and accommodation. No scleral icterus. Extraocular muscles intact.  HEENT: Head  atraumatic, normocephalic. Oropharynx and nasopharynx clear.  NECK:  Supple, no jugular venous distention. No thyroid enlargement, no tenderness.  LUNGS: moderate breath sounds with bilateral rales and rhonchi.no wheezing No use of accessory muscles of respiration.  CARDIOVASCULAR: left-sided chest tenderness and left upper back tenderness on palpation S1, S2 normal. No murmurs, rubs, or gallops.  ABDOMEN: Soft, nontender, nondistended. Bowel sounds present. No organomegaly or mass.  EXTREMITIES: No pedal edema, cyanosis, or clubbing.  NEUROLOGIC: Cranial nerves II through XII are intact. Muscle strength 5/5 in all extremities. Sensation intact. Gait not checked.  PSYCHIATRIC: The patient is alert and oriented x 3.  SKIN: No obvious rash, lesion, or ulcer.   LABORATORY PANEL:   CBC  Recent Labs Lab 11/20/16 1242  WBC 6.6  HGB 13.5  HCT 40.0  PLT 372   ------------------------------------------------------------------------------------------------------------------  Chemistries   Recent Labs Lab 11/20/16 1242  NA 135  K 4.3  CL 102  CO2 25  GLUCOSE 84  BUN 28*  CREATININE 1.33*  CALCIUM 9.8   ------------------------------------------------------------------------------------------------------------------  Cardiac Enzymes  Recent Labs Lab 11/20/16 1242  TROPONINI <0.03   ------------------------------------------------------------------------------------------------------------------  RADIOLOGY:  Dg Chest 2 View  Result Date: 11/20/2016 CLINICAL DATA:  Shortness of breath over the past 2 days. No chest pain but some left arm tingling was noted. History of CHF, mitral regurgitation, current smoker. EXAM: CHEST  2 VIEW COMPARISON:  Chest x-ray of March 07, 2016 FINDINGS: The lungs are well-expanded. The interstitial markings are coarse. There is no alveolar infiltrate or pleural effusion. The cardiac silhouette is enlarged. The pulmonary vascularity is mildly  prominent centrally. There is calcification in the wall of the thoracic aorta. The bony thorax is unremarkable. IMPRESSION: Chronic bronchitic changes, stable. Cardiomegaly with mild central pulmonary vascular congestion consistent with low-grade compensated CHF. Thoracic aortic atherosclerosis. Electronically Signed   By: David  [place] M.D.   On: 11/20/2016 13:07    EKG:   Orders placed or performed during the hospital encounter of 11/20/16  . EKG 12-Lead  . EKG 12-Lead  . ED EKG within 10 minutes  . ED EKG within 10 minutes    IMPRESSION AND PLAN:   Barbara Oliver  is a 66 y.o. female with a known history of chronic systolic congestive heart failure not seen by any cardiologist recently, cardiomyopathy, essential hypertension and GERD is presenting to the ED with a chief complaint of chest pain or shortness of breath.patient reports chest pain is on the left side of her chest radiating to the back. Feeling shortness of breath but denies any pedal edema, thinks her home dose Lasix is not helping her.  #acute on chronic systolic congestive heart failure Telemetry Lasix 40 mg IV every 12 hours Intake and output and daily weights Echocardiogram Cardiology consult to [facility] group Continue aspirin, Coreg and Aldactone Check fasting lipid panel Will consider to add ACE inhibitor if blood pressure permits  #Chest pain could be demand ischemia Monitored on telemetry,  cycle cardiac biomarkers EKG with no acute ST-T wave changes. Has chronic left bundle branch block when compared to previous EKG  Continue aspirin, beta blocker Coreg and statin Check fasting lipid panel  #History of dilated cardiomyopathy IV Lasix and check echocardiogram  #Essential hypertension Continue home medication Coreg and titrate as needed Will consider adding small dose ACEI if blood pressure permits  #gerd pEPCID  #Tobacco abuse disorder counseled to quit smoking for 4 minutes, will provide nicotine  patch    All the records are reviewed and case discussed with ED provider. Management plans discussed with the patient, family and they are in agreement.  CODE STATUS: FC, Brother Kathlene November is the healthcare power of attorney  TOTAL TIME TAKING CARE OF THIS PATIENT: 45 minutes.   Note: This dictation was prepared with Dragon dictation along with smaller phrase technology. Any transcriptional errors that result from this process are unintentional.  Ramonita Lab M.D on 11/20/2016 at 4:27 PM  Between 7am to 6pm - Pager - [phone]  After 6pm go to [email or web address] - password [email or web address]  [facility]  Office  [phone]  CC: Primary care physician; Patient, No Pcp Per

## 2016-11-20 NOTE — ED Notes (Signed)
Pt states numbness to L arm since 8:30 this morning and "fluttery feeling in my heart." states she is out of her fluid pills, also takes BP pills. Pt is alert and oriented. Able to transfer self from wheelchair to stretcher. States not really a pain just like an extremity asleep feeling.

## 2016-11-20 NOTE — Progress Notes (Signed)
Anticoagulation monitoring(Lovenox):  66 yo female ordered Lovenox 30 mg Q24h  Filed Weights   11/20/16 1240  Weight: 147 lb (66.7 kg)   BMI    Lab Results  Component Value Date   CREATININE 1.33 (H) 11/20/2016   CREATININE 1.00 03/11/2016   CREATININE 0.92 03/10/2016   Estimated Creatinine Clearance: 45 mL/min (A) (by C-G formula based on SCr of 1.33 mg/dL (H)). Hemoglobin & Hematocrit     Component Value Date/Time   HGB 13.5 11/20/2016 1242   HGB 11.9 (L) 03/29/2014 1948   HCT 40.0 11/20/2016 1242   HCT 36.8 03/29/2014 1948     Per Protocol for Patient with estCrcl > 30 ml/min and BMI < 40, will transition to Lovenox 40 mg Q24h.

## 2016-11-20 NOTE — ED Triage Notes (Signed)
PT to ED reporting pain in left side of chest that radiates down left arm. Pt reports pain is throbbing in nature and also reports SOB upon exertion. Pt reports numbness in left arm since 8 am this morning. No weakness noted and no other neuro deficits noted. Pt reports a headache for the past 2 days. No changes in vision.   Pt reports only heart hx if CHF.

## 2016-11-20 NOTE — Care Management (Signed)
Patient admitted with chest pain and exac of congestive heart failure.  She has no pcp. had been obtaining medications through the [facility] with scripts obtained as a result of hospitalizations.  She has had referrals to the [facility] as she lived in [place] but there was never an open appointment.  She is now a resident of [place].  Her face sheet shows she has medicaid and "this is the first I heard of it."  She does not have a medicaid card.  Patient says she has not applied for medicaid.  Will turn 66 in November and will have medicare but says she is not going to sign up for that part B.  CM  discussed  benefits of having part b coverage.  CM will contact [facility] [employer] to confirm whether patient really has this payor source.

## 2016-11-21 LAB — BASIC METABOLIC PANEL
ANION GAP: 10 (ref 5–15)
BUN: 31 mg/dL — AB (ref 6–20)
CO2: 24 mmol/L (ref 22–32)
Calcium: 9.4 mg/dL (ref 8.9–10.3)
Chloride: 102 mmol/L (ref 101–111)
Creatinine, Ser: 1.2 mg/dL — ABNORMAL HIGH (ref 0.44–1.00)
GFR calc Af Amer: 54 mL/min — ABNORMAL LOW (ref >60)
GFR, EST NON AFRICAN AMERICAN: 47 mL/min — AB (ref >60)
GLUCOSE: 109 mg/dL — AB (ref 65–99)
POTASSIUM: 3.5 mmol/L (ref 3.5–5.1)
Sodium: 136 mmol/L (ref 135–145)

## 2016-11-21 LAB — TROPONIN I: Troponin I: <0.03 ng/mL (ref <0.03)

## 2016-11-21 MED ORDER — BENZONATATE 100 MG PO CAPS
200.0000 mg | ORAL_CAPSULE | Freq: Three times a day (TID) | ORAL | Status: DC | PRN
  Administered 2016-11-21 – 2016-11-22 (×2): 200 mg via ORAL
  Filled 2016-11-21 (×3): qty 2

## 2016-11-21 NOTE — Care Management (Signed)
Informed patient regarding information about her medicaid and she says she did not know she had it.  [place] is her middle name.  Discussed the need for her to contact medicaid for name change and update primary care practice.  She says she has been seen at [facility].  Discussed that if she has medicaid, is not eligible to use [facility] and verbalizes understanding.  Provided patient with telephone contact information for [employer] and [place] [facility]. An appointment has been made at  [facility].  Patient does not have access to scales.  CM will provide and will assess for discharge meds

## 2016-11-21 NOTE — Consult Note (Signed)
Reason for Consult: Congestive heart failure chest pain shortness of breath Referring Physician: [facility]our[facility] hospitalist,   Barbara Oliver is an 66 y.o. female.  HPI: Presents with known congestive heart failure chronic systolic heart failure noncompliance history of hypertension GERD presented emergency room with left arm numbness chest pain shortness of breath. Patient describes pain radiating to her back and no leg swelling had not been getting any improvement with Lasix at home chest x-ray suggested pulmonary edema with elevated BNP patient was given Lasix therapy in an transferred to telemetry she's had significant improvement since being treated with intravenous diuretic therapy. Chest pain is much improved. Patient denies any primary physician she states she's weight 90 discharge before she proceeds  Past Medical History:  Diagnosis Date  . Acid reflux   . CHF (congestive heart failure) ([place])   . Mitral regurgitation     Past Surgical History:  Procedure Laterality Date  . HERNIA REPAIR      Family History  Problem Relation Age of Onset  . Hypertension Mother   . Heart attack Father     Social History:  reports that she has been smoking Cigarettes.  She has been smoking about 0.00 packs per day. She has never used smokeless tobacco. She reports that she does not drink alcohol or use drugs.  Allergies: No Known Allergies  Medications: I have reviewed the patient's current medications.  Results for orders placed or performed during the hospital encounter of 11/20/16 (from the past 48 hour(s))  Basic metabolic panel     Status: Abnormal   Collection Time: 11/20/16 12:42 PM  Result Value Ref Range   Sodium 135 135 - 145 mmol/L   Potassium 4.3 3.5 - 5.1 mmol/L   Chloride 102 101 - 111 mmol/L   CO2 25 22 - 32 mmol/L   Glucose, Bld 84 65 - 99 mg/dL   BUN 28 (H) 6 - 20 mg/dL   Creatinine, Ser 1.33 (H) 0.44 - 1.00 mg/dL   Calcium 9.8 8.9 - 10.3 mg/dL   GFR calc non Af Amer 41 (L) >60  mL/min   GFR calc Af Amer 48 (L) >60 mL/min    Comment: (NOTE) The eGFR has been calculated using the CKD EPI equation. This calculation has not been validated in all clinical situations. eGFR's persistently <60 mL/min signify possible Chronic Kidney Disease.    Anion gap 8 5 - 15  CBC     Status: Abnormal   Collection Time: 11/20/16 12:42 PM  Result Value Ref Range   WBC 6.6 3.6 - 11.0 K/uL   RBC 4.40 3.80 - 5.20 MIL/uL   Hemoglobin 13.5 12.0 - 16.0 g/dL   HCT 40.0 35.0 - 47.0 %   MCV 90.9 80.0 - 100.0 fL   MCH 30.8 26.0 - 34.0 pg   MCHC 33.9 32.0 - 36.0 g/dL   RDW 15.4 (H) 11.5 - 14.5 %   Platelets 372 150 - 440 K/uL  Troponin I     Status: None   Collection Time: 11/20/16 12:42 PM  Result Value Ref Range   Troponin I <0.03 <0.03 ng/mL  Brain natriuretic peptide     Status: Abnormal   Collection Time: 11/20/16 12:42 PM  Result Value Ref Range   B Natriuretic Peptide 1,272.0 (H) 0.0 - 100.0 pg/mL  Troponin I (q 6hr x 3)     Status: None   Collection Time: 11/20/16  5:15 PM  Result Value Ref Range   Troponin I <0.03 <0.03 ng/mL  Troponin I (q 6hr x 3)     Status: None   Collection Time: 11/20/16 11:12 PM  Result Value Ref Range   Troponin I <0.03 <0.03 ng/mL  Troponin I (q 6hr x 3)     Status: None   Collection Time: 11/21/16  5:02 AM  Result Value Ref Range   Troponin I <0.03 <0.03 ng/mL  Basic metabolic panel     Status: Abnormal   Collection Time: 11/21/16  5:02 AM  Result Value Ref Range   Sodium 136 135 - 145 mmol/L   Potassium 3.5 3.5 - 5.1 mmol/L   Chloride 102 101 - 111 mmol/L   CO2 24 22 - 32 mmol/L   Glucose, Bld 109 (H) 65 - 99 mg/dL   BUN 31 (H) 6 - 20 mg/dL   Creatinine, Ser 1.20 (H) 0.44 - 1.00 mg/dL   Calcium 9.4 8.9 - 10.3 mg/dL   GFR calc non Af Amer 47 (L) >60 mL/min   GFR calc Af Amer 54 (L) >60 mL/min    Comment: (NOTE) The eGFR has been calculated using the CKD EPI equation. This calculation has not been validated in all clinical  situations. eGFR's persistently <60 mL/min signify possible Chronic Kidney Disease.    Anion gap 10 5 - 15    Dg Chest 2 View  Result Date: 11/20/2016 CLINICAL DATA:  Shortness of breath over the past 2 days. No chest pain but some left arm tingling was noted. History of CHF, mitral regurgitation, current smoker. EXAM: CHEST  2 VIEW COMPARISON:  Chest x-ray of March 07, 2016 FINDINGS: The lungs are well-expanded. The interstitial markings are coarse. There is no alveolar infiltrate or pleural effusion. The cardiac silhouette is enlarged. The pulmonary vascularity is mildly prominent centrally. There is calcification in the wall of the thoracic aorta. The bony thorax is unremarkable. IMPRESSION: Chronic bronchitic changes, stable. Cardiomegaly with mild central pulmonary vascular congestion consistent with low-grade compensated CHF. Thoracic aortic atherosclerosis. Electronically Signed   By: David  Martinique M.D.   On: 11/20/2016 13:07    Review of Systems  Constitutional: Positive for diaphoresis and malaise/fatigue.  HENT: Positive for congestion.   Eyes: Negative.   Respiratory: Positive for shortness of breath.   Cardiovascular: Positive for chest pain, palpitations, orthopnea and PND.  Gastrointestinal: Negative.   Genitourinary: Negative.   Musculoskeletal: Negative.   Skin: Negative.   Endo/Heme/Allergies: Negative.   Psychiatric/Behavioral: Negative.    Blood pressure (!) 106/58, pulse 82, temperature 97.9 F (36.6 C), temperature source Oral, resp. rate 18, height _0  (1.854 m), weight 152 lb (68.9 kg), SpO2 95 %. Physical Exam  Nursing note and vitals reviewed. Constitutional: She is oriented to person, place, and time. She appears well-developed and well-nourished.  HENT:  Head: Normocephalic and atraumatic.  Eyes: Pupils are equal, round, and reactive to light. Conjunctivae and EOM are normal.  Neck: Normal range of motion. Neck supple.  Cardiovascular: Normal rate,  regular rhythm and normal heart sounds.   Respiratory: Effort normal and breath sounds normal.  GI: Soft. Bowel sounds are normal.  Musculoskeletal: Normal range of motion.  Neurological: She is alert and oriented to person, place, and time. She has normal reflexes.  Skin: Skin is warm and dry.  Psychiatric: She has a normal mood and affect.    Assessment/Plan: Congestive heart failure Cardio myopathy systolic dysfunction Short shortness of breath Chest pain Left arm numbness Palpitations GERD Chronic renal insufficiency Left bundle-branch block . Plan Agree with telemetry rule  out for myocardial infarction Continue evaluation for cardiac enzymes and EKG Continue diuretic therapy to help with shortness of breath Inhalers to help with shortness of breath and congestion Was patient to quit smoking Recommend continue omeprazole therapy for reflux symptoms Have patient follow-up with nephrology for renal insufficiency EKG unchanged troponins negative recommend conservative cardiac ischemic therapy at this point  Dwayne D Callwood 11/21/2016, 5:02 PM

## 2016-11-21 NOTE — Care Management (Signed)
Contacted [employer].   There is an active medicaid policy associated with this patient effective 12-10-2015 but the name on the account is Barbara Oliver .  The medical practice listed is "[facility] in [place]

## 2016-11-21 NOTE — Progress Notes (Signed)
[facility] - [place] [facility]                                                                                                                                                                                  Patient Demographics   Barbara Oliver, is a 66 y.o. female, DOB - 1950-07-26, NWG:[id number]  Admit date - 11/20/2016   Admitting Physician Ramonita Lab, MD  Outpatient Primary MD for the patient is Patient, No Pcp Per   LOS - 1  Subjective: Patient admitted with shortness of breath Chest pain She does not follow-up with primary care provider or cardiologist   Review of Systems:   CONSTITUTIONAL: No documented fever. No fatigue, weakness. No weight gain, no weight loss.  EYES: No blurry or double vision.  ENT: No tinnitus. No postnasal drip. No redness of the oropharynx.  RESPIRATORY: No cough, no wheeze, no hemoptysis. positive dyspnea.  CARDIOVASCULAR: No chest pain. No orthopnea. No palpitations. No syncope.  GASTROINTESTINAL: No nausea, no vomiting or diarrhea. No abdominal pain. No melena or hematochezia.  GENITOURINARY: No dysuria or hematuria.  ENDOCRINE: No polyuria or nocturia. No heat or cold intolerance.  HEMATOLOGY: No anemia. No bruising. No bleeding.  INTEGUMENTARY: No rashes. No lesions.  MUSCULOSKELETAL: No arthritis. No swelling. No gout.  NEUROLOGIC: No numbness, tingling, or ataxia. No seizure-type activity.  PSYCHIATRIC: No anxiety. No insomnia. No ADD.    Vitals:   Vitals:   11/20/16 1702 11/20/16 2010 11/21/16 0436 11/21/16 0942  BP: 120/75 95/83 105/62 (!) 106/58  Pulse: 80 88 77 82  Resp: Temp: 97.8 F (36.6 C) 97.9 F (36.6 C) 97.9 F (36.6 C)   TempSrc: Oral Oral Oral   SpO2: 100% 100% 93% 95%  Weight:   152 lb (68.9 kg)   Height:        Wt Readings from Last 3 Encounters:  11/21/16 152 lb (68.9 kg)  03/11/16 147 lb 6.4 oz (66.9 kg)  12/12/15 156 lb (70.8 kg)     Intake/Output Summary (Last 24 hours)  at 11/21/16 1432 Last data filed at 11/21/16 1408  Gross per 24 hour  Intake             1080 ml  Output             1820 ml  Net             -740 ml    Physical Exam:   GENERAL: Pleasant-appearing in no apparent distress.  HEAD, EYES, EARS, NOSE AND THROAT: Atraumatic, normocephalic. Extraocular muscles are intact. Pupils equal and reactive to light. Sclerae anicteric. No conjunctival injection. No oro-pharyngeal erythema.  NECK: Supple. There is no jugular venous distention. No bruits, no lymphadenopathy, no thyromegaly.  HEART: Regular rate and rhythm,. Positive systolic murmurs, no rubs, no clicks.  LUNGS: Clear to auscultation bilaterally. No rales or rhonchi. No wheezes.  ABDOMEN: Soft, flat, nontender, nondistended. Has good bowel sounds. No hepatosplenomegaly appreciated.  EXTREMITIES: No evidence of any cyanosis, clubbing, or peripheral edema.  +2 pedal and radial pulses bilaterally.  NEUROLOGIC: The patient is alert, awake, and oriented x3 with no focal motor or sensory deficits appreciated bilaterally.  SKIN: Moist and warm with no rashes appreciated.  Psych: Not anxious, depressed LN: No inguinal LN enlargement    Antibiotics   Anti-infectives    None      Medications   Scheduled Meds: . aspirin EC  81 mg Oral Daily  . carvedilol  6.25 mg Oral BID  . enoxaparin (LOVENOX) injection  40 mg Subcutaneous Q24H  . famotidine  20 mg Oral Daily  . furosemide  40 mg Intravenous Q12H  . nicotine  7 mg Transdermal Daily  . pantoprazole  40 mg Oral Daily  . sodium chloride flush  3 mL Intravenous Q12H  . spironolactone  12.5 mg Oral Daily   Continuous Infusions: . sodium chloride     PRN Meds:.sodium chloride, acetaminophen, albuterol, docusate sodium, guaiFENesin-dextromethorphan, ondansetron (ZOFRAN) IV, sodium chloride flush   Data Review:   Micro Results No results found for this or any previous visit (from the past 240 hour(s)).  Radiology Reports Dg Chest  2 View  Result Date: 11/20/2016 CLINICAL DATA:  Shortness of breath over the past 2 days. No chest pain but some left arm tingling was noted. History of CHF, mitral regurgitation, current smoker. EXAM: CHEST  2 VIEW COMPARISON:  Chest x-ray of March 07, 2016 FINDINGS: The lungs are well-expanded. The interstitial markings are coarse. There is no alveolar infiltrate or pleural effusion. The cardiac silhouette is enlarged. The pulmonary vascularity is mildly prominent centrally. There is calcification in the wall of the thoracic aorta. The bony thorax is unremarkable. IMPRESSION: Chronic bronchitic changes, stable. Cardiomegaly with mild central pulmonary vascular congestion consistent with low-grade compensated CHF. Thoracic aortic atherosclerosis. Electronically Signed   By: David  [place] M.D.   On: 11/20/2016 13:07     CBC  Recent Labs Lab 11/20/16 1242  WBC 6.6  HGB 13.5  HCT 40.0  PLT 372  MCV 90.9  MCH 30.8  MCHC 33.9  RDW 15.4*    Chemistries   Recent Labs Lab 11/20/16 1242 11/21/16 0502  NA 135 136  K 4.3 3.5  CL 102 102  CO2 25 24  GLUCOSE 84 109*  BUN 28* 31*  CREATININE 1.33* 1.20*  CALCIUM 9.8 9.4   ------------------------------------------------------------------------------------------------------------------ estimated creatinine clearance is 51.5 mL/min (A) (by C-G formula based on SCr of 1.2 mg/dL (H)). ------------------------------------------------------------------------------------------------------------------ No results for input(s): HGBA1C in the last 72 hours. ------------------------------------------------------------------------------------------------------------------ No results for input(s): CHOL, HDL, LDLCALC, TRIG, CHOLHDL, LDLDIRECT in the last 72 hours. ------------------------------------------------------------------------------------------------------------------ No results for input(s): TSH, T4TOTAL, T3FREE, THYROIDAB in the last 72  hours.  Invalid input(s): FREET3 ------------------------------------------------------------------------------------------------------------------ No results for input(s): VITAMINB12, FOLATE, FERRITIN, TIBC, IRON, RETICCTPCT in the last 72 hours.  Coagulation profile No results for input(s): INR, PROTIME in the last 168 hours.  No results for input(s): DDIMER in the last 72 hours.  Cardiac Enzymes  Recent Labs Lab 11/20/16 1715 11/20/16 2312 11/21/16 0502  TROPONINI <0.03 <0.03 <0.03   ------------------------------------------------------------------------------------------------------------------ Invalid input(s): POCBNP    Assessment &  Plan   Eriyana Sweeten  is a 66 y.o. female with a known history of chronic systolic congestive heart failure not seen by any cardiologist recently, cardiomyopathy, essential hypertension and GERD is presenting to the ED with a chief complaint of chest pain or shortness of breath.patient reports chest pain is on the left side of her chest radiating to the back. Feeling shortness of breath but denies any pedal edema, thinks her home dose Lasix is not helping her.  #acute on chronic systolic congestive heart failure Continue Telemetry Lasix 40 mg IV every 12 hours Cardiology consult  #Severe valvular disease including mitral regurg  Cardiology evaluation is pending May need CT surgery evaluation   #Chest pain could be demand ischemia troponins are undetectable  #History of dilated cardiomyopathy IV Lasix and check echocardiogram  #Essential hypertension Continue home medication Coreg and titrate as needed   #gerd pEPCID  #Tobacco abuse disorder Continue nicotine patch      Code Status Orders        Start     Ordered   11/20/16 1716  Full code  Continuous     11/20/16 1715    Code Status History    Date Active Date Inactive Code Status Order ID Comments User Context   03/07/2016  7:42 AM 03/11/2016  6:11 PM Full  Code [id number]  Delfino Lovett, MD ED   10/06/2015  8:08 AM 10/06/2015  2:56 PM Full Code [id number]  Angelita Ingles, RN Inpatient   10/05/2015  2:27 AM 10/05/2015  7:45 AM Full Code [id number]  Oralia Manis, MD Inpatient           Consults    DVT Prophylaxis  Lovenox    Lab Results  Component Value Date   PLT 372 11/20/2016     Time Spent in minutes   Greater than 50% of time spent in care coordination and counseling patient regarding the condition and plan of care.   Auburn Bilberry M.D on 11/21/2016 at 2:32 PM  Between 7am to 6pm - Pager - [phone]  After 6pm go to [email or web address] - password [email or web address] [facility]  [facility]   Office  [phone]

## 2016-11-21 NOTE — Plan of Care (Signed)
Problem: Food- and Nutrition-Related Knowledge Deficit (NB-1.1) Goal: Nutrition education Formal process to instruct or train a patient/client in a skill or to impart knowledge to help patients/clients voluntarily manage or modify food choices and eating behavior to maintain or improve health. Outcome: Adequate for Discharge Nutrition Education Note  RD consulted for nutrition education regarding a Heart Healthy diet.   Lipid Panel  No results found for: CHOL, TRIG, HDL, CHOLHDL, VLDL, LDLCALC  RD provided "Heart Healthy Nutrition Therapy" handout from the Academy of [employer]etics. Reviewed patient's dietary recall. Provided examples on ways to decrease sodium and fat intake in diet. Discouraged intake of processed foods and use of salt shaker. Encouraged fresh fruits and vegetables as well as whole grain sources of carbohydrates to maximize fiber intake. Teach back method used.  Expect fair compliance.  Body mass index is 20.05 kg/m. Pt meets criteria for normal body weight based on current BMI.  Current diet order is heart healthy, patient is consuming approximately 100% of meals at this time. Labs and medications reviewed. No further nutrition interventions warranted at this time. RD contact information provided. If additional nutrition issues arise, please re-consult RD.  Dionne Ano. Samuel Rittenhouse, MS, RD LDN Inpatient Clinical Dietitian Pager [phone]

## 2016-11-22 ENCOUNTER — Inpatient Hospital Stay: Payer: Medicaid Other

## 2016-11-22 LAB — ECHOCARDIOGRAM COMPLETE
HEIGHTINCHES: 73 in
WEIGHTICAEL: 2352 [oz_av]

## 2016-11-22 LAB — BASIC METABOLIC PANEL
Anion gap: 10 (ref 5–15)
BUN: 41 mg/dL — ABNORMAL HIGH (ref 6–20)
CALCIUM: 9.9 mg/dL (ref 8.9–10.3)
CO2: 26 mmol/L (ref 22–32)
CREATININE: 1.38 mg/dL — AB (ref 0.44–1.00)
Chloride: 101 mmol/L (ref 101–111)
GFR, EST AFRICAN AMERICAN: 46 mL/min — AB (ref >60)
GFR, EST NON AFRICAN AMERICAN: 39 mL/min — AB (ref >60)
Glucose, Bld: 101 mg/dL — ABNORMAL HIGH (ref 65–99)
Potassium: 3.8 mmol/L (ref 3.5–5.1)
SODIUM: 137 mmol/L (ref 135–145)

## 2016-11-22 LAB — GLUCOSE, CAPILLARY: GLUCOSE-CAPILLARY: 179 mg/dL — AB (ref 65–99)

## 2016-11-22 MED ORDER — FLUTICASONE FUROATE-VILANTEROL 200-25 MCG/INH IN AEPB
1.0000 | INHALATION_SPRAY | Freq: Every day | RESPIRATORY_TRACT | Status: DC
  Administered 2016-11-23: 10:00:00 1 via RESPIRATORY_TRACT
  Filled 2016-11-22: qty 28

## 2016-11-22 MED ORDER — AZITHROMYCIN 250 MG PO TABS
500.0000 mg | ORAL_TABLET | Freq: Every day | ORAL | Status: DC
  Administered 2016-11-22 – 2016-11-23 (×2): 500 mg via ORAL
  Filled 2016-11-22 (×2): qty 2

## 2016-11-22 MED ORDER — FUROSEMIDE 40 MG PO TABS
40.0000 mg | ORAL_TABLET | Freq: Two times a day (BID) | ORAL | Status: DC
  Administered 2016-11-22 – 2016-11-23 (×2): 40 mg via ORAL
  Filled 2016-11-22 (×2): qty 1

## 2016-11-22 MED ORDER — IPRATROPIUM-ALBUTEROL 0.5-2.5 (3) MG/3ML IN SOLN
3.0000 mL | Freq: Four times a day (QID) | RESPIRATORY_TRACT | Status: DC
  Administered 2016-11-22 – 2016-11-23 (×3): 3 mL via RESPIRATORY_TRACT
  Filled 2016-11-22 (×3): qty 3

## 2016-11-22 MED ORDER — BENZONATATE 100 MG PO CAPS
200.0000 mg | ORAL_CAPSULE | Freq: Three times a day (TID) | ORAL | Status: DC
  Administered 2016-11-22 – 2016-11-23 (×4): 200 mg via ORAL
  Filled 2016-11-22 (×4): qty 2

## 2016-11-22 MED ORDER — METHYLPREDNISOLONE SODIUM SUCC 40 MG IJ SOLR
40.0000 mg | Freq: Two times a day (BID) | INTRAMUSCULAR | Status: DC
  Administered 2016-11-23: 40 mg via INTRAVENOUS
  Filled 2016-11-22: qty 1

## 2016-11-22 MED ORDER — METHYLPREDNISOLONE SODIUM SUCC 125 MG IJ SOLR
60.0000 mg | Freq: Four times a day (QID) | INTRAMUSCULAR | Status: DC
  Administered 2016-11-22: 60 mg via INTRAVENOUS
  Filled 2016-11-22: qty 2

## 2016-11-22 NOTE — Progress Notes (Signed)
[employer] - [place] [facility]                                                                                                                                                                                  Patient Demographics   Shaneen Reeser, is a 66 y.o. female, DOB - 1950-05-07, WUJ:[id number]  Admit date - 11/20/2016   Admitting Physician Ramonita Lab, MD  Outpatient Primary MD for the patient is Patient, No Pcp Per   LOS - 2  Subjective: Continues to complain of shortness of breath and cough  Review of Systems:   CONSTITUTIONAL: No documented fever. No fatigue, weakness. No weight gain, no weight loss.  EYES: No blurry or double vision.  ENT: No tinnitus. No postnasal drip. No redness of the oropharynx.  RESPIRATORY: positive cough, no wheeze, no hemoptysis. positive dyspnea.  CARDIOVASCULAR: No chest pain. No orthopnea. No palpitations. No syncope.  GASTROINTESTINAL: No nausea, no vomiting or diarrhea. No abdominal pain. No melena or hematochezia.  GENITOURINARY: No dysuria or hematuria.  ENDOCRINE: No polyuria or nocturia. No heat or cold intolerance.  HEMATOLOGY: No anemia. No bruising. No bleeding.  INTEGUMENTARY: No rashes. No lesions.  MUSCULOSKELETAL: No arthritis. No swelling. No gout.  NEUROLOGIC: No numbness, tingling, or ataxia. No seizure-type activity.  PSYCHIATRIC: No anxiety. No insomnia. No ADD.    Vitals:   Vitals:   11/21/16 1943 11/22/16 0404 11/22/16 0834 11/22/16 0835  BP: 113/66 103/68 119/74 125/69  Pulse: 75 72 80 78  Resp: Temp: 97.7 F (36.5 C) 98 F (36.7 C) 97.7 F (36.5 C) 97.7 F (36.5 C)  TempSrc: Oral  Oral   SpO2: 100% 96% 100% 100%  Weight:  155 lb 3.2 oz (70.4 kg)    Height:        Wt Readings from Last 3 Encounters:  11/22/16 155 lb 3.2 oz (70.4 kg)  03/11/16 147 lb 6.4 oz (66.9 kg)  12/12/15 156 lb (70.8 kg)     Intake/Output Summary (Last 24 hours) at 11/22/16 1344 Last data filed at  11/22/16 1014  Gross per 24 hour  Intake              480 ml  Output             1200 ml  Net             -720 ml    Physical Exam:   GENERAL: Pleasant-appearing in no apparent distress.  HEAD, EYES, EARS, NOSE AND THROAT: Atraumatic, normocephalic. Extraocular muscles are intact. Pupils equal and reactive to light. Sclerae anicteric. No conjunctival injection. No oro-pharyngeal erythema.  NECK: Supple.  There is no jugular venous distention. No bruits, no lymphadenopathy, no thyromegaly.  HEART: Regular rate and rhythm,. Positive systolic murmurs, no rubs, no clicks.  LUNGS: Clear to auscultation bilaterally. No rales or rhonchi. No wheezes.  ABDOMEN: Soft, flat, nontender, nondistended. Has good bowel sounds. No hepatosplenomegaly appreciated.  EXTREMITIES: No evidence of any cyanosis, clubbing, or peripheral edema.  +2 pedal and radial pulses bilaterally.  NEUROLOGIC: The patient is alert, awake, and oriented x3 with no focal motor or sensory deficits appreciated bilaterally.  SKIN: Moist and warm with no rashes appreciated.  Psych: Not anxious, depressed LN: No inguinal LN enlargement    Antibiotics   Anti-infectives    Start     Dose/Rate Route Frequency Ordered Stop   11/22/16 1200  azithromycin (ZITHROMAX) tablet 500 mg     500 mg Oral Daily 11/22/16 1118        Medications   Scheduled Meds: . aspirin EC  81 mg Oral Daily  . azithromycin  500 mg Oral Daily  . benzonatate  200 mg Oral TID  . carvedilol  6.25 mg Oral BID  . enoxaparin (LOVENOX) injection  40 mg Subcutaneous Q24H  . famotidine  20 mg Oral Daily  . fluticasone furoate-vilanterol  1 puff Inhalation Daily  . furosemide  40 mg Oral BID  . ipratropium-albuterol  3 mL Nebulization Q6H  . methylPREDNISolone (SOLU-MEDROL) injection  60 mg Intravenous Q6H  . nicotine  7 mg Transdermal Daily  . pantoprazole  40 mg Oral Daily  . sodium chloride flush  3 mL Intravenous Q12H  . spironolactone  12.5 mg Oral Daily    Continuous Infusions: . sodium chloride     PRN Meds:.sodium chloride, acetaminophen, albuterol, benzonatate, docusate sodium, guaiFENesin-dextromethorphan, ondansetron (ZOFRAN) IV, sodium chloride flush   Data Review:   Micro Results No results found for this or any previous visit (from the past 240 hour(s)).  Radiology Reports Dg Chest 2 View  Result Date: 11/20/2016 CLINICAL DATA:  Shortness of breath over the past 2 days. No chest pain but some left arm tingling was noted. History of CHF, mitral regurgitation, current smoker. EXAM: CHEST  2 VIEW COMPARISON:  Chest x-ray of March 07, 2016 FINDINGS: The lungs are well-expanded. The interstitial markings are coarse. There is no alveolar infiltrate or pleural effusion. The cardiac silhouette is enlarged. The pulmonary vascularity is mildly prominent centrally. There is calcification in the wall of the thoracic aorta. The bony thorax is unremarkable. IMPRESSION: Chronic bronchitic changes, stable. Cardiomegaly with mild central pulmonary vascular congestion consistent with low-grade compensated CHF. Thoracic aortic atherosclerosis. Electronically Signed   By: David  [place] M.D.   On: 11/20/2016 13:07   Ct Chest Wo Contrast  Result Date: 11/22/2016 CLINICAL DATA:  66 year old female with a history of cough and smoking EXAM: CT CHEST WITHOUT CONTRAST TECHNIQUE: Multidetector CT imaging of the chest was performed following the standard protocol without IV contrast. COMPARISON:  Chest x-ray 11/20/2016, CT chest 03/10/2016 FINDINGS: Cardiovascular: Cardiomegaly again demonstrated no pericardial fluid/ thickening. Calcifications of thoracic aorta and the proximal branch vessels. Diameter of the aorta unchanged. Calcifications of left main, left anterior descending, right coronary artery. Mediastinum/Nodes: Lymph nodes of the mediastinum are again present, unchanged from the comparison CT. Not enlarged. Index lymph node in the lowest peritracheal  nodal station measures 9 mm. Small fluid within pericardial recess Lungs/Pleura: Centrilobular emphysema again noted. No interlobular septal thickening, confluent airspace disease, or bronchial wall thickening. No endotracheal or bronchial wall debris. No pleural effusion.  No pneumothorax. Similar appearance of architectural distortion and scarring at the left lung base continuous with the diaphragm. Re- demonstration at the peripheral nodule of the right middle lobe abutting the pleura measuring 7 mm, unchanged from the comparison CT. (image 94 series 3). The small nodule in the superior segment of the right lower lobe is no longer visualized. Upper Abdomen: Unremarkable Musculoskeletal: No displaced fracture. Degenerative changes of the visualized spine. No bony canal narrowing. IMPRESSION: No acute finding. Re- demonstration of right middle lobe nodule, measuring 7 mm and unchanged from the prior CT of 03/10/2016. As was previously noted, surveillance is indicated, and this current CT may serve as the 6 - 12 month surveillance CT. A second surveillance CT to be performed approximately 18 - 24 months after 03/10/2016 is again recommended. This recommendation follows the consensus statement: Guidelines for Management of Incidental Pulmonary Nodules Detected on CT Images: From the Fleischner Society 2017; Radiology 2017; 284:228-243. Centrilobular emphysema.  Emphysema (ICD10-J43.9). Aortic atherosclerosis with coronary artery disease. Aortic Atherosclerosis (ICD10-I70.0). Electronically Signed   By: Gilmer Mor D.O.   On: 11/22/2016 12:22     CBC  Recent Labs Lab 11/20/16 1242  WBC 6.6  HGB 13.5  HCT 40.0  PLT 372  MCV 90.9  MCH 30.8  MCHC 33.9  RDW 15.4*    Chemistries   Recent Labs Lab 11/20/16 1242 11/21/16 0502 11/22/16 0357  NA 135 136 137  K 4.3 3.5 3.8  CL 102 102 101  CO2 GLUCOSE 84 109* 101*  BUN 28* 31* 41*  CREATININE 1.33* 1.20* 1.38*  CALCIUM 9.8 9.4 9.9    ------------------------------------------------------------------------------------------------------------------ estimated creatinine clearance is 45.8 mL/min (A) (by C-G formula based on SCr of 1.38 mg/dL (H)). ------------------------------------------------------------------------------------------------------------------ No results for input(s): HGBA1C in the last 72 hours. ------------------------------------------------------------------------------------------------------------------ No results for input(s): CHOL, HDL, LDLCALC, TRIG, CHOLHDL, LDLDIRECT in the last 72 hours. ------------------------------------------------------------------------------------------------------------------ No results for input(s): TSH, T4TOTAL, T3FREE, THYROIDAB in the last 72 hours.  Invalid input(s): FREET3 ------------------------------------------------------------------------------------------------------------------ No results for input(s): VITAMINB12, FOLATE, FERRITIN, TIBC, IRON, RETICCTPCT in the last 72 hours.  Coagulation profile No results for input(s): INR, PROTIME in the last 168 hours.  No results for input(s): DDIMER in the last 72 hours.  Cardiac Enzymes  Recent Labs Lab 11/20/16 1715 11/20/16 2312 11/21/16 0502  TROPONINI <0.03 <0.03 <0.03   ------------------------------------------------------------------------------------------------------------------ Invalid input(s): POCBNP    Assessment & Plan   Diahn Waidelich  is a 66 y.o. female with a known history of chronic systolic congestive heart failure not seen by any cardiologist recently, cardiomyopathy, essential hypertension and GERD is presenting to the ED with a chief complaint of chest pain or shortness of breath.patient reports chest pain is on the left side of her chest radiating to the back. Feeling shortness of breath but denies any pedal edema, thinks her home dose Lasix is not helping her.  #acute on chronic  systolic congestive heart failure Continue Telemetry Echo result pending Creatinine increasing I'll change her to oral Lasix  #Severe valvular disease including mitral regurg  Cardiology evaluation is pending May need CT surgery evaluation  #cough I will order the CT scan of the chest which shows emphysematous changes no pneumonia sounds like she has acute bronchitis I will treat her with steroids and nebulizer therapy   #Chest pain could be demand ischemia troponins are undetectable  #History of dilated cardiomyopathy Oral Lasix echo pending  #Essential hypertension Continue home medication Coreg and titrate as needed   #  gerd pEPCID  #Tobacco abuse disorder Continue nicotine patch      Code Status Orders        Start     Ordered   11/20/16 1716  Full code  Continuous     11/20/16 1715    Code Status History    Date Active Date Inactive Code Status Order ID Comments User Context   03/07/2016  7:42 AM 03/11/2016  6:11 PM Full Code [id number]  Delfino Lovett, MD ED   10/06/2015  8:08 AM 10/06/2015  2:56 PM Full Code [id number]  Angelita Ingles, RN Inpatient   10/05/2015  2:27 AM 10/05/2015  7:45 AM Full Code [id number]  Oralia Manis, MD Inpatient           Consults    DVT Prophylaxis  Lovenox    Lab Results  Component Value Date   PLT 372 11/20/2016     Time Spent in minutes   Greater than 50% of time spent in care coordination and counseling patient regarding the condition and plan of care.   Auburn Bilberry M.D on 11/22/2016 at 1:44 PM  Between 7am to 6pm - Pager - [phone]  After 6pm go to [email or web address] - password [email or web address] [facility]  [facility]   Office  [phone]

## 2016-11-22 NOTE — Progress Notes (Signed)
Patient presently resting in the bed, alert an oriented, denies any pain, vss, patient independent in the room, rounded with MD at bedside, patient awaiting to get CT done as per order

## 2016-11-22 NOTE — Progress Notes (Signed)
Rounded on patient.  Patient admitted with dx of Acute CHF. Echo done on 11/20/2016 - EF was 20 - 25%.   Reviewed booklet, "Living Better with Heart Failure' with patient.  Explained to patient what HF is and the significance of the EF measurement obtained with echo. Explained CHF is a chronic illness that requires self assessment and self management with help from cardiologist / HF Clinic.  Stressed the importance of weighing every morning at the same time and assessing her weight / symptoms.  Explained the HF zones, symptoms for each zone, and actions required if symptoms / weight corresponds to green, yellow or red zone.  Scales provided by CM.  Reviewed different classification of medications used to treat HF.  Dietitian Consult placed yesterday and patient was seen yesterday by the Dietitian.  Patient stated this was very helpful.  Activity / Exercise discussed.  Explained to patient it is important for her to be as active as she can everyday.  Cardiac Rehab reviewed with patient.  I explained to patient she is a candidate for Cardiac Rehab based on Medicaid/Medicare guidelines.  Overview of the Cardiac Rehab program provided.  Explained to patient per Medicare guidelines one with HF has to be home on HF medications and stable for six weeks before starting Cardiac Rehab.  Patient stated she drives, but does not have a car.  She uses her brother's car or she takes public transportation.  Patient is interested in participating in Cardiac Rehab.  Order entered per protocol.  Explained to patient Cardiac Rehab will contact her in approximately 4 weeks to set up an appointment for orientation.  Cardiac Brochure given to patient.  Patient thanked me for providing this information to her.     Army Melia, RN, BSN, [facility] Cardiovascular and Pulmonary Nurse Navigator Total of 45 minutes spent with patient.

## 2016-11-22 NOTE — Care Management (Signed)
Provided patient with her face sheet which has her medicaid ID number. Discussed and reinforced the need for her to have the name changed and physician practice for future follow up.  Patient verbalized understanding

## 2016-11-23 LAB — BASIC METABOLIC PANEL
ANION GAP: 8 (ref 5–15)
BUN: 46 mg/dL — ABNORMAL HIGH (ref 6–20)
CHLORIDE: 102 mmol/L (ref 101–111)
CO2: 23 mmol/L (ref 22–32)
Calcium: 9.9 mg/dL (ref 8.9–10.3)
Creatinine, Ser: 1.26 mg/dL — ABNORMAL HIGH (ref 0.44–1.00)
GFR, EST AFRICAN AMERICAN: 51 mL/min — AB (ref >60)
GFR, EST NON AFRICAN AMERICAN: 44 mL/min — AB (ref >60)
Glucose, Bld: 143 mg/dL — ABNORMAL HIGH (ref 65–99)
POTASSIUM: 5.3 mmol/L — AB (ref 3.5–5.1)
SODIUM: 133 mmol/L — AB (ref 135–145)

## 2016-11-23 LAB — TROPONIN I

## 2016-11-23 MED ORDER — ALBUTEROL SULFATE HFA 108 (90 BASE) MCG/ACT IN AERS: 2.0000 | INHALATION_SPRAY | Freq: Four times a day (QID) | RESPIRATORY_TRACT | 1 refills | Status: DC | PRN

## 2016-11-23 MED ORDER — INSULIN ASPART 100 UNIT/ML IV SOLN
10.0000 [IU] | Freq: Once | INTRAVENOUS | Status: AC
  Administered 2016-11-23: 10 [IU] via INTRAVENOUS
  Filled 2016-11-23: qty 0.1

## 2016-11-23 MED ORDER — AZITHROMYCIN 250 MG PO TABS: ORAL_TABLET | ORAL | 0 refills | Status: DC

## 2016-11-23 MED ORDER — SODIUM POLYSTYRENE SULFONATE 15 GM/60ML PO SUSP
30.0000 g | Freq: Once | ORAL | Status: AC
  Administered 2016-11-23: 30 g via ORAL
  Filled 2016-11-23: qty 120

## 2016-11-23 MED ORDER — IPRATROPIUM-ALBUTEROL 0.5-2.5 (3) MG/3ML IN SOLN: 3.0000 mL | Freq: Four times a day (QID) | RESPIRATORY_TRACT | Status: DC | PRN

## 2016-11-23 MED ORDER — NICOTINE 7 MG/24HR TD PT24: 7.0000 mg | MEDICATED_PATCH | Freq: Every day | TRANSDERMAL | 0 refills | Status: DC

## 2016-11-23 MED ORDER — DEXTROSE 50 % IV SOLN
25.0000 mL | Freq: Once | INTRAVENOUS | Status: AC
  Administered 2016-11-23: 25 mL via INTRAVENOUS
  Filled 2016-11-23: qty 50

## 2016-11-23 NOTE — Discharge Instructions (Signed)
Heart Failure Clinic appointment on November 28 2016 at 9:00am with Clarisa Kindred, FNP. Please call [phone] to reschedule.

## 2016-11-23 NOTE — Discharge Summary (Addendum)
[facility] [place] [facility]   PATIENT NAME: Barbara Oliver    MR#:  [id number]  DATE OF BIRTH:  Mar 15, 1950  DATE OF ADMISSION:  11/20/2016 ADMITTING PHYSICIAN: Ramonita Lab, MD  DATE OF DISCHARGE: 11/23/2016  PRIMARY CARE PHYSICIAN: Patient, No Pcp Per    ADMISSION DIAGNOSIS:  Left arm pain [M79.602] Chest pain, unspecified type [R07.9] Acute on chronic congestive heart failure, unspecified heart failure type (HCC) [I50.9]   DISCHARGE DIAGNOSIS:  Active Problems:   Acute on chronic CHF (congestive heart failure) (HCC)   SECONDARY DIAGNOSIS:   Past Medical History:  Diagnosis Date  . Acid reflux   . CHF (congestive heart failure) (HCC)   . Mitral regurgitation     HOSPITAL COURSE:   66 year old female with chronic systolic heart failure ejection fraction 20-25%, noncompliance and essential hypertension who presented to the emergency room complaining of chest pain and shortness of breath.  1. Acute on chronic systolic congestive heart failure: Patient underwent echocardiogram which shows ejection fraction 20-25% and grade 1 diastolic heart failure. This is unchanged from previous echocardiogram. She was diuresed with IV Lasix. At the time of discharge she is euvolemic and she was followed by cardiology while in the hospital. She will be referred to CHF clinic upon discharge. She will continue Lasix, Coreg and Aldactone at the time of discharge.  2. Essential hypertension: Continue Coreg  3. Atypical chest pain: Patient was ruled out for ACS with negative troponins. Telemetry monitoring showed no acute changes. She did have some chest pain while in the hospital however she only reported this to me. I obtain EKG which did not show any acute changes. She has known left bundle-branch block. Her troponin again was negative. She would benefit from outpatient stress test. This was discussed with cardiology.  4. Moderate mitral regurgitation: Patient will  follow-up with cardiology.  5. Tobacco dependence: Patient is encouraged to quit smoking. Counseling was provided for 4 minutes.  6. Hyperkalemia: This is treated prior to discharge with Kayexalate insulin and dextrose  DISCHARGE CONDITIONS AND DIET:   Stable for discharge on heart healthy diet  CONSULTS OBTAINED:    DRUG ALLERGIES:  No Known Allergies  DISCHARGE MEDICATIONS:   Current Discharge Medication List    START taking these medications   Details  azithromycin (ZITHROMAX) 250 MG tablet Take 1 table daily for 4 days Qty: 4 each, Refills: 0    nicotine (NICODERM CQ - DOSED IN MG/24 HR) 7 mg/24hr patch Place 1 patch (7 mg total) onto the skin daily. Qty: 28 patch, Refills: 0      CONTINUE these medications which have CHANGED   Details  albuterol (PROVENTIL HFA;VENTOLIN HFA) 108 (90 Base) MCG/ACT inhaler Inhale 2 puffs into the lungs every 6 (six) hours as needed for wheezing or shortness of breath. Qty: 1 Inhaler, Refills: 1      CONTINUE these medications which have NOT CHANGED   Details  aspirin EC 81 MG EC tablet Take 1 tablet (81 mg total) by mouth daily.    carvedilol (COREG) 6.25 MG tablet Take 1 tablet (6.25 mg total) by mouth 2 (two) times daily. Qty: 60 tablet, Refills: 0    furosemide (LASIX) 40 MG tablet Take 1 tablet (40 mg total) by mouth daily. Qty: 30 tablet, Refills: 0    pantoprazole (PROTONIX) 40 MG tablet Take 1 tablet (40 mg total) by mouth daily. Qty: 30 tablet, Refills: 0    spironolactone (ALDACTONE) 25 MG tablet Take 0.5 tablets (  12.5 mg total) by mouth daily. Qty: 30 tablet, Refills: 0      STOP taking these medications     docusate sodium (COLACE) 100 MG capsule      guaiFENesin-dextromethorphan (ROBITUSSIN DM) 100-10 MG/5ML syrup           Today   CHIEF COMPLAINT:   Patient had some atypical chest pain however this is resolved. She does not describe exacerbating or relieving factors. She denies shortness of breath and  nausea associated with it.  She did not tell anybody about her chest pain.  VITAL SIGNS:  Blood pressure 132/87, pulse 76, temperature 97.9 F (36.6 C), temperature source Oral, resp. rate 20, height  (1.854 m), weight 70.3 kg (155 lb), SpO2 100 %.   REVIEW OF SYSTEMS:  Review of Systems  Constitutional: Negative.  Negative for chills, fever and malaise/fatigue.  HENT: Negative.  Negative for ear discharge, ear pain, hearing loss, nosebleeds and sore throat.   Eyes: Negative.  Negative for blurred vision and pain.  Respiratory: Negative.  Negative for cough, hemoptysis, shortness of breath and wheezing.   Cardiovascular: Negative.  Negative for chest pain, palpitations and leg swelling.  Gastrointestinal: Negative.  Negative for abdominal pain, blood in stool, diarrhea, nausea and vomiting.  Genitourinary: Negative.  Negative for dysuria.  Musculoskeletal: Negative.  Negative for back pain.  Skin: Negative.   Neurological: Negative for dizziness, tremors, speech change, focal weakness, seizures and headaches.  Endo/Heme/Allergies: Negative.  Does not bruise/bleed easily.  Psychiatric/Behavioral: Negative.  Negative for depression, hallucinations and suicidal ideas.     PHYSICAL EXAMINATION:  GENERAL:  66 y.o.-year-old patient lying in the bed with no acute distress.  NECK:  Supple, no jugular venous distention. No thyroid enlargement, no tenderness.  LUNGS: Normal breath sounds bilaterally, no wheezing, rales,rhonchi  No use of accessory muscles of respiration.  CARDIOVASCULAR: S1, S2 normal. No murmurs, rubs, or gallops.  ABDOMEN: Soft, non-tender, non-distended. Bowel sounds present. No organomegaly or mass.  EXTREMITIES: No pedal edema, cyanosis, or clubbing.  PSYCHIATRIC: The patient is alert and oriented x 3.  SKIN: No obvious rash, lesion, or ulcer.   DATA REVIEW:   CBC  Recent Labs Lab 11/20/16 1242  WBC 6.6  HGB 13.5  HCT 40.0  PLT 372    Chemistries    Recent Labs Lab 11/23/16 0434  NA 133*  K 5.3*  CL 102  CO2 23  GLUCOSE 143*  BUN 46*  CREATININE 1.26*  CALCIUM 9.9    Cardiac Enzymes  Recent Labs Lab 11/20/16 2312 11/21/16 0502 11/23/16 0844  TROPONINI <0.03 <0.03 <0.03    Microbiology Results  @  RADIOLOGY:  Ct Chest Wo Contrast  Result Date: 11/22/2016 CLINICAL DATA:  66 year old female with a history of cough and smoking EXAM: CT CHEST WITHOUT CONTRAST TECHNIQUE: Multidetector CT imaging of the chest was performed following the standard protocol without IV contrast. COMPARISON:  Chest x-ray 11/20/2016, CT chest 03/10/2016 FINDINGS: Cardiovascular: Cardiomegaly again demonstrated no pericardial fluid/ thickening. Calcifications of thoracic aorta and the proximal branch vessels. Diameter of the aorta unchanged. Calcifications of left main, left anterior descending, right coronary artery. Mediastinum/Nodes: Lymph nodes of the mediastinum are again present, unchanged from the comparison CT. Not enlarged. Index lymph node in the lowest peritracheal nodal station measures 9 mm. Small fluid within pericardial recess Lungs/Pleura: Centrilobular emphysema again noted. No interlobular septal thickening, confluent airspace disease, or bronchial wall thickening. No endotracheal or bronchial wall debris. No pleural effusion. No pneumothorax. Similar  appearance of architectural distortion and scarring at the left lung base continuous with the diaphragm. Re- demonstration at the peripheral nodule of the right middle lobe abutting the pleura measuring 7 mm, unchanged from the comparison CT. (image 94 series 3). The small nodule in the superior segment of the right lower lobe is no longer visualized. Upper Abdomen: Unremarkable Musculoskeletal: No displaced fracture. Degenerative changes of the visualized spine. No bony canal narrowing. IMPRESSION: No acute finding. Re- demonstration of right middle lobe nodule, measuring 7 mm and  unchanged from the prior CT of 03/10/2016. As was previously noted, surveillance is indicated, and this current CT may serve as the 6 - 12 month surveillance CT. A second surveillance CT to be performed approximately 18 - 24 months after 03/10/2016 is again recommended. This recommendation follows the consensus statement: Guidelines for Management of Incidental Pulmonary Nodules Detected on CT Images: From the Fleischner Society 2017; Radiology 2017; 284:228-243. Centrilobular emphysema.  Emphysema (ICD10-J43.9). Aortic atherosclerosis with coronary artery disease. Aortic Atherosclerosis (ICD10-I70.0). Electronically Signed   By: Gilmer Mor D.O.   On: 11/22/2016 12:22      Current Discharge Medication List    START taking these medications   Details  azithromycin (ZITHROMAX) 250 MG tablet Take 1 table daily for 4 days Qty: 4 each, Refills: 0    nicotine (NICODERM CQ - DOSED IN MG/24 HR) 7 mg/24hr patch Place 1 patch (7 mg total) onto the skin daily. Qty: 28 patch, Refills: 0      CONTINUE these medications which have CHANGED   Details  albuterol (PROVENTIL HFA;VENTOLIN HFA) 108 (90 Base) MCG/ACT inhaler Inhale 2 puffs into the lungs every 6 (six) hours as needed for wheezing or shortness of breath. Qty: 1 Inhaler, Refills: 1      CONTINUE these medications which have NOT CHANGED   Details  aspirin EC 81 MG EC tablet Take 1 tablet (81 mg total) by mouth daily.    carvedilol (COREG) 6.25 MG tablet Take 1 tablet (6.25 mg total) by mouth 2 (two) times daily. Qty: 60 tablet, Refills: 0    furosemide (LASIX) 40 MG tablet Take 1 tablet (40 mg total) by mouth daily. Qty: 30 tablet, Refills: 0    pantoprazole (PROTONIX) 40 MG tablet Take 1 tablet (40 mg total) by mouth daily. Qty: 30 tablet, Refills: 0    spironolactone (ALDACTONE) 25 MG tablet Take 0.5 tablets (12.5 mg total) by mouth daily. Qty: 30 tablet, Refills: 0      STOP taking these medications     docusate sodium (COLACE)  100 MG capsule      guaiFENesin-dextromethorphan (ROBITUSSIN DM) 100-10 MG/5ML syrup            Management plans discussed with the patient and she is in agreement. Stable for discharge   Patient should follow up with cardiology  CODE STATUS:     Code Status Orders        Start     Ordered   11/20/16 1716  Full code  Continuous     11/20/16 1715    Code Status History    Date Active Date Inactive Code Status Order ID Comments User Context   03/07/2016  7:42 AM 03/11/2016  6:11 PM Full Code [id number]  Delfino Lovett, MD ED   10/06/2015  8:08 AM 10/06/2015  2:56 PM Full Code [id number]  Angelita Ingles, RN Inpatient   10/05/2015  2:27 AM 10/05/2015  7:45 AM Full Code [id number]  Oralia Manis, MD Inpatient  TOTAL TIME TAKING CARE OF THIS PATIENT: 37 minutes.    Note: This dictation was prepared with Dragon dictation along with smaller phrase technology. Any transcriptional errors that result from this process are unintentional.  Serafino Burciaga M.D on 11/23/2016 at 9:53 AM  Between 7am to 6pm - Pager - [phone] After 6pm go to [email or web address] - password [email or web address]  [facility]  Office  [phone]  CC: Primary care physician; Patient, No Pcp Per

## 2016-11-28 ENCOUNTER — Telehealth: Payer: Self-pay | Admitting: Family

## 2016-11-28 ENCOUNTER — Ambulatory Visit: Payer: Medicaid Other | Admitting: Family

## 2016-11-28 NOTE — Telephone Encounter (Signed)
Patient missed her initial appointment at [facility] on 11/28/16. Will attempt to reschedule.

## 2016-12-12 ENCOUNTER — Encounter: Payer: Self-pay | Admitting: Emergency Medicine

## 2016-12-12 ENCOUNTER — Emergency Department: Payer: No Typology Code available for payment source

## 2016-12-12 ENCOUNTER — Emergency Department
Admission: EM | Admit: 2016-12-12 | Discharge: 2016-12-12 | Disposition: A | Discharge status: Home / Self Care | Payer: No Typology Code available for payment source | Attending: Emergency Medicine | Admitting: Emergency Medicine

## 2016-12-12 DIAGNOSIS — Z7982 Long term (current) use of aspirin: Secondary | ICD-10-CM | POA: Insufficient documentation

## 2016-12-12 DIAGNOSIS — Y998 Other external cause status: Secondary | ICD-10-CM | POA: Insufficient documentation

## 2016-12-12 DIAGNOSIS — Y9389 Activity, other specified: Secondary | ICD-10-CM | POA: Insufficient documentation

## 2016-12-12 DIAGNOSIS — M542 Cervicalgia: Secondary | ICD-10-CM | POA: Insufficient documentation

## 2016-12-12 DIAGNOSIS — Z79899 Other long term (current) drug therapy: Secondary | ICD-10-CM | POA: Insufficient documentation

## 2016-12-12 DIAGNOSIS — I5023 Acute on chronic systolic (congestive) heart failure: Secondary | ICD-10-CM | POA: Insufficient documentation

## 2016-12-12 DIAGNOSIS — Y92481 Parking lot as the place of occurrence of the external cause: Secondary | ICD-10-CM | POA: Insufficient documentation

## 2016-12-12 DIAGNOSIS — R42 Dizziness and giddiness: Secondary | ICD-10-CM | POA: Insufficient documentation

## 2016-12-12 DIAGNOSIS — F17210 Nicotine dependence, cigarettes, uncomplicated: Secondary | ICD-10-CM | POA: Insufficient documentation

## 2016-12-12 MED ORDER — DIAZEPAM 2 MG PO TABS
2.0000 mg | ORAL_TABLET | Freq: Once | ORAL | Status: AC
  Administered 2016-12-12: 2 mg via ORAL
  Filled 2016-12-12: qty 1

## 2016-12-12 MED ORDER — LIDOCAINE 5 % EX PTCH
1.0000 | MEDICATED_PATCH | CUTANEOUS | Status: DC
  Administered 2016-12-12: 1 via TRANSDERMAL
  Filled 2016-12-12: qty 1

## 2016-12-12 MED ORDER — TRAMADOL HCL 50 MG PO TABS: 50.0000 mg | ORAL_TABLET | Freq: Four times a day (QID) | ORAL | 0 refills | Status: AC | PRN

## 2016-12-12 MED ORDER — OXYCODONE-ACETAMINOPHEN 5-325 MG PO TABS
1.0000 | ORAL_TABLET | Freq: Once | ORAL | Status: AC
  Administered 2016-12-12: 1 via ORAL
  Filled 2016-12-12: qty 1

## 2016-12-12 MED ORDER — CYCLOBENZAPRINE HCL 5 MG PO TABS: 5.0000 mg | ORAL_TABLET | Freq: Three times a day (TID) | ORAL | 0 refills | Status: AC | PRN
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
## 2017-01-28 ENCOUNTER — Emergency Department (HOSPITAL_COMMUNITY)
Admission: EM | Admit: 2017-01-28 | Discharge: 2017-01-28 | Disposition: A | Discharge status: Home / Self Care | Payer: Medicare Other | Attending: Emergency Medicine | Admitting: Emergency Medicine

## 2017-01-28 ENCOUNTER — Encounter (HOSPITAL_COMMUNITY): Payer: Self-pay | Admitting: Emergency Medicine

## 2017-01-28 ENCOUNTER — Emergency Department (HOSPITAL_COMMUNITY): Payer: Medicare Other

## 2017-01-28 DIAGNOSIS — Z79899 Other long term (current) drug therapy: Secondary | ICD-10-CM | POA: Diagnosis not present

## 2017-01-28 DIAGNOSIS — J069 Acute upper respiratory infection, unspecified: Secondary | ICD-10-CM | POA: Diagnosis not present

## 2017-01-28 DIAGNOSIS — I509 Heart failure, unspecified: Secondary | ICD-10-CM

## 2017-01-28 DIAGNOSIS — R0602 Shortness of breath: Secondary | ICD-10-CM

## 2017-01-28 DIAGNOSIS — I5022 Chronic systolic (congestive) heart failure: Secondary | ICD-10-CM | POA: Diagnosis not present

## 2017-01-28 DIAGNOSIS — R05 Cough: Secondary | ICD-10-CM | POA: Diagnosis present

## 2017-01-28 DIAGNOSIS — F17210 Nicotine dependence, cigarettes, uncomplicated: Secondary | ICD-10-CM | POA: Diagnosis not present

## 2017-01-28 LAB — BASIC METABOLIC PANEL
ANION GAP: 9 (ref 5–15)
BUN: 26 mg/dL — ABNORMAL HIGH (ref 6–20)
CHLORIDE: 105 mmol/L (ref 101–111)
CO2: 24 mmol/L (ref 22–32)
CREATININE: 0.99 mg/dL (ref 0.44–1.00)
Calcium: 9.9 mg/dL (ref 8.9–10.3)
GFR calc non Af Amer: 59 mL/min — ABNORMAL LOW (ref >60)
Glucose, Bld: 87 mg/dL (ref 65–99)
Potassium: 4.3 mmol/L (ref 3.5–5.1)
Sodium: 138 mmol/L (ref 135–145)

## 2017-01-28 LAB — CBC WITH DIFFERENTIAL/PLATELET
BASOS PCT: 1 %
Basophils Absolute: 0 10*3/uL (ref 0.0–0.1)
Eosinophils Absolute: 0.2 10*3/uL (ref 0.0–0.7)
Eosinophils Relative: 2 %
HEMATOCRIT: 39.8 % (ref 36.0–46.0)
HEMOGLOBIN: 12.7 g/dL (ref 12.0–15.0)
LYMPHS ABS: 2.3 10*3/uL (ref 0.7–4.0)
LYMPHS PCT: 37 %
MCH: 29.2 pg (ref 26.0–34.0)
MCHC: 31.9 g/dL (ref 30.0–36.0)
MCV: 91.5 fL (ref 78.0–100.0)
MONOS PCT: 5 %
Monocytes Absolute: 0.3 10*3/uL (ref 0.1–1.0)
NEUTROS ABS: 3.4 10*3/uL (ref 1.7–7.7)
NEUTROS PCT: 55 %
Platelets: 370 10*3/uL (ref 150–400)
RBC: 4.35 MIL/uL (ref 3.87–5.11)
RDW: 14.8 % (ref 11.5–15.5)
WBC: 6.2 10*3/uL (ref 4.0–10.5)

## 2017-01-28 LAB — I-STAT TROPONIN, ED: Troponin i, poc: 0.01 ng/mL (ref 0.00–0.08)

## 2017-01-28 LAB — BRAIN NATRIURETIC PEPTIDE: B NATRIURETIC PEPTIDE 5: 1647.5 pg/mL — AB (ref 0.0–100.0)

## 2017-01-28 MED ORDER — SPIRONOLACTONE 25 MG PO TABS: 12.5000 mg | ORAL_TABLET | Freq: Every day | ORAL | 0 refills | Status: DC

## 2017-01-28 MED ORDER — FLUTICASONE PROPIONATE 50 MCG/ACT NA SUSP: 1.0000 | Freq: Every day | NASAL | 0 refills | Status: AC

## 2017-01-28 MED ORDER — ALBUTEROL SULFATE HFA 108 (90 BASE) MCG/ACT IN AERS: 1.0000 | INHALATION_SPRAY | Freq: Four times a day (QID) | RESPIRATORY_TRACT | 0 refills | Status: AC | PRN

## 2017-01-28 MED ORDER — FUROSEMIDE 10 MG/ML IJ SOLN
40.0000 mg | Freq: Once | INTRAMUSCULAR | Status: AC
  Administered 2017-01-28: 40 mg via INTRAVENOUS
  Filled 2017-01-28: qty 4

## 2017-01-28 MED ORDER — FUROSEMIDE 40 MG PO TABS: ORAL_TABLET | ORAL | 0 refills | Status: DC

## 2017-01-28 MED ORDER — ALBUTEROL SULFATE (2.5 MG/3ML) 0.083% IN NEBU
5.0000 mg | INHALATION_SOLUTION | Freq: Once | RESPIRATORY_TRACT | Status: AC
  Administered 2017-01-28: 5 mg via RESPIRATORY_TRACT
  Filled 2017-01-28: qty 6

## 2017-01-28 NOTE — ED Triage Notes (Signed)
Patient reports that she has been out of her HTN and fluid pills that [facility] when she was living in [place] but since moved to [place] hasnt been able to be seen by them. She waiting on insurance to get set up so she can est a PCP. Patient reports cough with grey phlegm, SOb esp with exertion.

## 2017-01-28 NOTE — ED Provider Notes (Signed)
[facility]-EMERGENCY DEPT Provider Note   CSN: [id number] Arrival date & time: 01/28/17  1102     History   Chief Complaint Chief Complaint  Patient presents with  . Shortness of Breath  . Cough    HPI Barbara Oliver is a 66 y.o. female presenting with cough and shortness of breath.  Patient states that for the past 3 days, she has had worsening cough and shortness of breath.  Cough is productive, mucus sputum.  Shortness of breath is worse with exertion and when lying flat.  She has a history of CHF and asthma, and states she has been out of her fluid pills and blood pressure medicine for the past week.  She became eligible for Medicare yesterday, and has an appointment scheduled with [facility] on Monday to receive insurance and set up primary care.  Additionally, patient reports sinus pressure and postnasal drip.  She denies fevers, chills, ear pain, sore throat, chest pain, nausea, vomiting, abdominal pain, urinary symptoms, abnormal bowel movements, or leg pain or swelling.  She denies sick contacts.   HPI  Past Medical History:  Diagnosis Date  . Acid reflux   . CHF (congestive heart failure) (HCC)   . Mitral regurgitation     Patient Active Problem List   Diagnosis Date Noted  . Acute CHF (congestive heart failure) (HCC) 11/20/2016  . CHF (congestive heart failure) (HCC) 03/07/2016  . Protein-calorie malnutrition, severe 03/07/2016  . Abdominal pain, epigastric 10/06/2015  . Elevated transaminase level 10/06/2015  . Acute renal insufficiency 10/06/2015  . Hyperglycemia 10/06/2015  . Severe mitral regurgitation 10/06/2015  . 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 Acute on chronic systolic CHF (congestive heart failure) (HCC) 10/05/2015    Past Surgical History:  Procedure Laterality Date  . HERNIA REPAIR      OB History    No data available       Home Medications    Prior to Admission medications   Medication Sig Start  Date End Date Taking? Authorizing Provider  acetaminophen (TYLENOL) 500 MG tablet Take 1,000 mg by mouth daily as needed for mild pain.   Yes [provider]  carvedilol (COREG) 6.25 MG tablet Take 1 tablet (6.25 mg total) by mouth 2 (two) times daily. 03/11/16  Yes Gouru, Deanna Artis, MD  ibuprofen (ADVIL,MOTRIN) 200 MG tablet Take 800 mg by mouth daily as needed.   Yes [provider]  pantoprazole (PROTONIX) 40 MG tablet Take 1 tablet (40 mg total) by mouth daily. 03/11/16  Yes Gouru, Deanna Artis, MD  albuterol (PROVENTIL HFA;VENTOLIN HFA) 108 (90 Base) MCG/ACT inhaler Inhale 1-2 puffs into the lungs every 6 (six) hours as needed for wheezing or shortness of breath. 01/28/17   Orazio Weller, PA-C  aspirin EC 81 MG EC tablet Take 1 tablet (81 mg total) by mouth daily. Patient not taking: Reported on 01/28/2017 03/12/16   Ramonita Lab, MD  azithromycin (ZITHROMAX) 250 MG tablet Take 1 table daily for 4 days Patient not taking: Reported on 01/28/2017 11/23/16   Adrian Saran, MD  fluticasone (FLONASE) 50 MCG/ACT nasal spray Place 1 spray into both nostrils daily. 01/28/17   Chalonda Schlatter, PA-C  furosemide (LASIX) 40 MG tablet Take 1 tablet 2 times a day for the next 3 days.  After this, resume taking 1 tablet once daily 01/28/17   Kaylah Chiasson, PA-C  nicotine (NICODERM CQ - DOSED IN MG/24 HR) 7 mg/24hr patch Place 1 patch (7 mg total) onto the skin daily. Patient  not taking: Reported on 01/28/2017 11/23/16   Adrian Saran, MD  spironolactone (ALDACTONE) 25 MG tablet Take 0.5 tablets (12.5 mg total) by mouth daily. 01/28/17   Vinton Layson, PA-C  traMADol (ULTRAM) 50 MG tablet Take 1 tablet (50 mg total) by mouth every 6 (six) hours as needed. Patient not taking: Reported on 01/28/2017 12/12/16 12/12/17  Enid Derry, PA-C    Family History Family History  Problem Relation Age of Onset  . Hypertension Mother   . Heart attack Father     Social History Social History   Tobacco  Use  . Smoking status: Current Some Day Smoker    Packs/day: 0.00    Types: Cigarettes  . Smokeless tobacco: Never Used  Substance Use Topics  . Alcohol use: No  . Drug use: No     Allergies   Patient has no known allergies.   Review of Systems Review of Systems  HENT: Positive for postnasal drip and sinus pressure.   Respiratory: Positive for cough and shortness of breath.   All other systems reviewed and are negative.    Physical Exam Updated Vital Signs BP 130/86 (BP Location: Left Arm)   Pulse 86   Temp 97.8 F (36.6 C) (Oral)   Resp 20   Ht 6\' 1"  (1.854 m)   Wt 71.7 kg (158 lb)   SpO2 100%   BMI 20.85 kg/m   Physical Exam  Constitutional: She is oriented to person, place, and time. She appears well-developed and well-nourished. No distress.  HENT:  Head: Normocephalic and atraumatic.  Right Ear: Tympanic membrane, external ear and ear canal normal.  Left Ear: Tympanic membrane, external ear and ear canal normal.  Nose: Mucosal edema present. Right sinus exhibits no maxillary sinus tenderness and no frontal sinus tenderness. Left sinus exhibits no maxillary sinus tenderness and no frontal sinus tenderness.  Mouth/Throat: Uvula is midline, oropharynx is clear and moist and mucous membranes are normal.  Eyes: EOM are normal.  Neck: Normal range of motion.  Cardiovascular: Normal rate, regular rhythm and intact distal pulses.  Pulmonary/Chest: Effort normal and breath sounds normal. No stridor. No respiratory distress. She has no wheezes. She has no rales.  Patient speaking full sentences without difficulty.  Clear lung sounds in all fields  Abdominal: Soft. She exhibits no distension and no mass. There is no tenderness. There is no guarding.  Musculoskeletal: Normal range of motion.  No leg pain or swelling  Lymphadenopathy:    She has no cervical adenopathy.  Neurological: She is alert and oriented to person, place, and time.  Skin: Skin is warm and dry.    Psychiatric: She has a normal mood and affect.  Nursing note and vitals reviewed.    ED Treatments / Results  Labs (all labs ordered are listed, but only abnormal results are displayed) Labs Reviewed  BASIC METABOLIC PANEL - Abnormal; Notable for the following components:      Result Value   BUN 26 (*)    GFR calc non Af Amer 59 (*)    All other components within normal limits  BRAIN NATRIURETIC PEPTIDE - Abnormal; Notable for the following components:   B Natriuretic Peptide 1,647.5 (*)    All other components within normal limits  CBC WITH DIFFERENTIAL/PLATELET  I-STAT TROPONIN, ED    EKG  EKG Interpretation  Date/Time:  Tuesday January 28 2017 11:16:31 EST Ventricular Rate:  83 PR Interval:    QRS Duration: 154 QT Interval:  442 QTC Calculation: 520 R  Axis:   -61 Text Interpretation:  Sinus rhythm Atrial premature complex Consider right atrial enlargement Left bundle branch block Confirmed by Donnetta Hutchingook, Brian ([id number]) on 01/28/2017 3:20:42 PM Also confirmed by Donnetta Hutchingook, Brian ([id number])  on 01/28/2017 3:21:33 PM       Radiology Dg Chest 2 View  Result Date: 01/28/2017 CLINICAL DATA:  Shortness of breath EXAM: CHEST  2 VIEW COMPARISON:  11/20/2016 FINDINGS: Chronic cardiomegaly. Vascular pedicle widening. Interstitial opacity with [employer] Corporation[employer] lines. No effusion or air bronchogram. Artifact from EKG pads bilaterally. IMPRESSION: CHF. Electronically Signed   By: Marnee SpringJonathon  Watts M.D.   On: 01/28/2017 12:34    Procedures Procedures (including critical care time)  Medications Ordered in ED Medications  albuterol (PROVENTIL) (2.5 MG/3ML) 0.083% nebulizer solution 5 mg (5 mg Nebulization Given 01/28/17 1610)  furosemide (LASIX) injection 40 mg (40 mg Intravenous Given 01/28/17 1611)     Initial Impression / Assessment and Plan / ED Course  I have reviewed the triage vital signs and the nursing notes.  Pertinent labs & imaging results that were available during my care of the  patient were reviewed by me and considered in my medical decision making (see chart for details).     Patient presenting for cough and shortness of breath.  Physical exam shows patient with good sats, afebrile.  Lung exam reassuring.  Per chart review, patient is noncompliant with follow-up, and has been out of her medications.  Will obtain CBC, BMP, BNP and troponin.  EKG unchanged from previous.  X-ray without gross abnormality or fluid overload.  Clinically, patient does not appear fluid overloaded.  Labs reassuring, BNP elevated, not significantly changed from baseline.  Troponin negative.  No leukocytosis.  Patient ambulated without drop in O2 sats.  Likely viral illness with postnasal drip and noncompliance of medication causing symptoms.  Will give short course of Lasix and spironolactone and have patient establish primary care.  Case discussed with attending, Dr. Adriana Simasook evaluated the pt.  At this time, patient appears safe for discharge.  Return precautions given.  Patient states she understands and agrees to plan.   Final Clinical Impressions(s) / ED Diagnoses   Final diagnoses:  Congestive heart failure, unspecified HF chronicity, unspecified heart failure type (HCC)  Shortness of breath  Upper respiratory tract infection, unspecified type    ED Discharge Orders        Ordered    furosemide (LASIX) 40 MG tablet     01/28/17 1633    spironolactone (ALDACTONE) 25 MG tablet  Daily     01/28/17 1633    albuterol (PROVENTIL HFA;VENTOLIN HFA) 108 (90 Base) MCG/ACT inhaler  Every 6 hours PRN     01/28/17 1633    fluticasone (FLONASE) 50 MCG/ACT nasal spray  Daily     01/28/17 1634       Alveria ApleyCaccavale, Lucion Dilger, PA-C 01/29/17 0008    Donnetta Hutchingook, Brian, MD 01/29/17 [phone]

## 2017-01-28 NOTE — Discharge Instructions (Signed)
Take medications as prescribed.  Take twice your lasix dose for the next 3 days.  Make sure you stay well-hydrated with water. It is very important that you establish primary care. Follow up with [facility] on Monday. If you are unable to find a primary care doctor, you may contact the [phone] number in this paperwork to help you find primary care. Or you may establish with  [facility] (information listed below).  Return to the ER if you develop persistent chest pain, worsening shortness of breath, fevers, or any new or worsening symptoms.

## 2017-02-20 DIAGNOSIS — R5383 Other fatigue: Secondary | ICD-10-CM | POA: Diagnosis not present

## 2017-02-20 DIAGNOSIS — Z0000 Encounter for general adult medical examination without abnormal findings: Secondary | ICD-10-CM | POA: Diagnosis not present

## 2017-02-20 DIAGNOSIS — R0602 Shortness of breath: Secondary | ICD-10-CM | POA: Diagnosis not present

## 2017-02-20 DIAGNOSIS — E559 Vitamin D deficiency, unspecified: Secondary | ICD-10-CM | POA: Diagnosis not present

## 2017-03-13 ENCOUNTER — Encounter (HOSPITAL_COMMUNITY): Payer: Self-pay

## 2017-03-13 ENCOUNTER — Telehealth (HOSPITAL_COMMUNITY): Payer: Self-pay

## 2017-03-13 NOTE — Telephone Encounter (Signed)
Attempted to call patient in regards to insurance - Wrong number is on file. Mailed letter.

## 2017-03-19 ENCOUNTER — Encounter (HOSPITAL_COMMUNITY): Payer: Self-pay

## 2017-03-19 ENCOUNTER — Inpatient Hospital Stay (HOSPITAL_COMMUNITY)
Admission: EM | Admit: 2017-03-19 | Discharge: 2017-03-25 | DRG: 287 | Disposition: A | Discharge status: Home / Self Care | Payer: Medicare HMO | Attending: Cardiology | Admitting: Cardiology

## 2017-03-19 ENCOUNTER — Emergency Department (HOSPITAL_COMMUNITY): Payer: Medicare HMO

## 2017-03-19 ENCOUNTER — Other Ambulatory Visit: Payer: Self-pay

## 2017-03-19 DIAGNOSIS — I428 Other cardiomyopathies: Secondary | ICD-10-CM

## 2017-03-19 DIAGNOSIS — R0989 Other specified symptoms and signs involving the circulatory and respiratory systems: Secondary | ICD-10-CM | POA: Diagnosis not present

## 2017-03-19 DIAGNOSIS — I447 Left bundle-branch block, unspecified: Secondary | ICD-10-CM | POA: Diagnosis not present

## 2017-03-19 DIAGNOSIS — Z7982 Long term (current) use of aspirin: Secondary | ICD-10-CM

## 2017-03-19 DIAGNOSIS — N183 Chronic kidney disease, stage 3 unspecified: Secondary | ICD-10-CM | POA: Diagnosis present

## 2017-03-19 DIAGNOSIS — R911 Solitary pulmonary nodule: Secondary | ICD-10-CM | POA: Diagnosis not present

## 2017-03-19 DIAGNOSIS — F17210 Nicotine dependence, cigarettes, uncomplicated: Secondary | ICD-10-CM | POA: Diagnosis present

## 2017-03-19 DIAGNOSIS — R069 Unspecified abnormalities of breathing: Secondary | ICD-10-CM | POA: Diagnosis not present

## 2017-03-19 DIAGNOSIS — R9431 Abnormal electrocardiogram [ECG] [EKG]: Secondary | ICD-10-CM | POA: Diagnosis not present

## 2017-03-19 DIAGNOSIS — R6889 Other general symptoms and signs: Secondary | ICD-10-CM | POA: Diagnosis not present

## 2017-03-19 DIAGNOSIS — I429 Cardiomyopathy, unspecified: Secondary | ICD-10-CM | POA: Diagnosis present

## 2017-03-19 DIAGNOSIS — Z720 Tobacco use: Secondary | ICD-10-CM | POA: Diagnosis present

## 2017-03-19 DIAGNOSIS — Z79899 Other long term (current) drug therapy: Secondary | ICD-10-CM

## 2017-03-19 DIAGNOSIS — I340 Nonrheumatic mitral (valve) insufficiency: Secondary | ICD-10-CM | POA: Diagnosis present

## 2017-03-19 DIAGNOSIS — I5023 Acute on chronic systolic (congestive) heart failure: Secondary | ICD-10-CM | POA: Diagnosis not present

## 2017-03-19 DIAGNOSIS — I5021 Acute systolic (congestive) heart failure: Secondary | ICD-10-CM | POA: Diagnosis not present

## 2017-03-19 DIAGNOSIS — F17200 Nicotine dependence, unspecified, uncomplicated: Secondary | ICD-10-CM | POA: Diagnosis not present

## 2017-03-19 DIAGNOSIS — M79606 Pain in leg, unspecified: Secondary | ICD-10-CM | POA: Diagnosis not present

## 2017-03-19 DIAGNOSIS — I509 Heart failure, unspecified: Secondary | ICD-10-CM | POA: Diagnosis not present

## 2017-03-19 DIAGNOSIS — R0602 Shortness of breath: Secondary | ICD-10-CM | POA: Diagnosis not present

## 2017-03-19 DIAGNOSIS — Z9119 Patient's noncompliance with other medical treatment and regimen: Secondary | ICD-10-CM | POA: Diagnosis not present

## 2017-03-19 DIAGNOSIS — Z91199 Patient's noncompliance with other medical treatment and regimen due to unspecified reason: Secondary | ICD-10-CM

## 2017-03-19 HISTORY — DX: Chronic kidney disease, stage 3 (moderate): N18.3

## 2017-03-19 HISTORY — DX: Other cardiomyopathies: I42.8

## 2017-03-19 HISTORY — DX: Acute on chronic systolic (congestive) heart failure: I50.23

## 2017-03-19 HISTORY — DX: Patient's noncompliance with other medical treatment and regimen: Z91.19

## 2017-03-19 HISTORY — DX: Tobacco use: Z72.0

## 2017-03-19 HISTORY — DX: Solitary pulmonary nodule: R91.1

## 2017-03-19 HISTORY — DX: Chronic systolic (congestive) heart failure: I50.22

## 2017-03-19 HISTORY — DX: Left bundle-branch block, unspecified: I44.7

## 2017-03-19 HISTORY — DX: Chronic kidney disease, stage 3 unspecified: N18.30

## 2017-03-19 HISTORY — DX: Patient's noncompliance with other medical treatment and regimen due to unspecified reason: Z91.199

## 2017-03-19 LAB — URINALYSIS, ROUTINE W REFLEX MICROSCOPIC
BILIRUBIN URINE: NEGATIVE
Glucose, UA: NEGATIVE mg/dL
HGB URINE DIPSTICK: NEGATIVE
Ketones, ur: NEGATIVE mg/dL
Leukocytes, UA: NEGATIVE
Nitrite: NEGATIVE
PROTEIN: NEGATIVE mg/dL
Specific Gravity, Urine: 1.016 (ref 1.005–1.030)
pH: 6 (ref 5.0–8.0)

## 2017-03-19 LAB — COMPREHENSIVE METABOLIC PANEL
ALBUMIN: 3.2 g/dL — AB (ref 3.5–5.0)
ALT: 19 U/L (ref 14–54)
ANION GAP: 9 (ref 5–15)
AST: 24 U/L (ref 15–41)
Alkaline Phosphatase: 119 U/L (ref 38–126)
BILIRUBIN TOTAL: 1.4 mg/dL — AB (ref 0.3–1.2)
BUN: 21 mg/dL — ABNORMAL HIGH (ref 6–20)
CO2: 20 mmol/L — ABNORMAL LOW (ref 22–32)
Calcium: 9.1 mg/dL (ref 8.9–10.3)
Chloride: 109 mmol/L (ref 101–111)
Creatinine, Ser: 1.09 mg/dL — ABNORMAL HIGH (ref 0.44–1.00)
GFR calc Af Amer: >60 mL/min (ref >60)
GFR, EST NON AFRICAN AMERICAN: 52 mL/min — AB (ref >60)
GLUCOSE: 84 mg/dL (ref 65–99)
POTASSIUM: 4.2 mmol/L (ref 3.5–5.1)
Sodium: 138 mmol/L (ref 135–145)
TOTAL PROTEIN: 7.3 g/dL (ref 6.5–8.1)

## 2017-03-19 LAB — CBC WITH DIFFERENTIAL/PLATELET
Basophils Absolute: 0.1 10*3/uL (ref 0.0–0.1)
Basophils Relative: 1 %
EOS ABS: 0.2 10*3/uL (ref 0.0–0.7)
Eosinophils Relative: 3 %
HCT: 37.9 % (ref 36.0–46.0)
HEMOGLOBIN: 11.7 g/dL — AB (ref 12.0–15.0)
Lymphocytes Relative: 38 %
Lymphs Abs: 2.5 10*3/uL (ref 0.7–4.0)
MCH: 27.7 pg (ref 26.0–34.0)
MCHC: 30.9 g/dL (ref 30.0–36.0)
MCV: 89.6 fL (ref 78.0–100.0)
Monocytes Absolute: 0.4 10*3/uL (ref 0.1–1.0)
Monocytes Relative: 6 %
NEUTROS PCT: 52 %
Neutro Abs: 3.4 10*3/uL (ref 1.7–7.7)
Platelets: 343 10*3/uL (ref 150–400)
RBC: 4.23 MIL/uL (ref 3.87–5.11)
RDW: 15.4 % (ref 11.5–15.5)
WBC: 6.5 10*3/uL (ref 4.0–10.5)

## 2017-03-19 LAB — CBC
HCT: 40.1 % (ref 36.0–46.0)
Hemoglobin: 12.7 g/dL (ref 12.0–15.0)
MCH: 28.7 pg (ref 26.0–34.0)
MCHC: 31.7 g/dL (ref 30.0–36.0)
MCV: 90.7 fL (ref 78.0–100.0)
Platelets: 303 10*3/uL (ref 150–400)
RBC: 4.42 MIL/uL (ref 3.87–5.11)
RDW: 15.5 % (ref 11.5–15.5)
WBC: 5.9 10*3/uL (ref 4.0–10.5)

## 2017-03-19 LAB — PROTIME-INR
INR: 1.2
PROTHROMBIN TIME: 15.1 s (ref 11.4–15.2)

## 2017-03-19 LAB — I-STAT TROPONIN, ED: Troponin i, poc: 0.02 ng/mL (ref 0.00–0.08)

## 2017-03-19 LAB — LIPASE, BLOOD: LIPASE: 27 U/L (ref 11–51)

## 2017-03-19 LAB — BRAIN NATRIURETIC PEPTIDE: B Natriuretic Peptide: 1726.9 pg/mL — ABNORMAL HIGH (ref 0.0–100.0)

## 2017-03-19 MED ORDER — ASPIRIN EC 81 MG PO TBEC
81.0000 mg | DELAYED_RELEASE_TABLET | Freq: Every day | ORAL | Status: DC
  Administered 2017-03-19 – 2017-03-25 (×6): 81 mg via ORAL
  Filled 2017-03-19 (×6): qty 1

## 2017-03-19 MED ORDER — ALBUTEROL SULFATE (2.5 MG/3ML) 0.083% IN NEBU
2.5000 mg | INHALATION_SOLUTION | Freq: Four times a day (QID) | RESPIRATORY_TRACT | Status: DC | PRN
  Administered 2017-03-24: 2.5 mg via RESPIRATORY_TRACT
  Filled 2017-03-19: qty 3

## 2017-03-19 MED ORDER — PANTOPRAZOLE SODIUM 40 MG PO TBEC
40.0000 mg | DELAYED_RELEASE_TABLET | Freq: Every day | ORAL | Status: DC
  Administered 2017-03-19 – 2017-03-25 (×7): 40 mg via ORAL
  Filled 2017-03-19 (×7): qty 1

## 2017-03-19 MED ORDER — LISINOPRIL 2.5 MG PO TABS: 5.0000 mg | ORAL_TABLET | Freq: Every day | ORAL | Status: DC

## 2017-03-19 MED ORDER — FLUTICASONE PROPIONATE 50 MCG/ACT NA SUSP
1.0000 | Freq: Every day | NASAL | Status: DC
  Administered 2017-03-20 – 2017-03-25 (×6): 1 via NASAL
  Filled 2017-03-19: qty 16

## 2017-03-19 MED ORDER — ENOXAPARIN SODIUM 40 MG/0.4ML ~~LOC~~ SOLN
40.0000 mg | Freq: Every day | SUBCUTANEOUS | Status: DC
  Filled 2017-03-19 (×2): qty 0.4

## 2017-03-19 MED ORDER — ONDANSETRON HCL 4 MG/2ML IJ SOLN: 4.0000 mg | Freq: Four times a day (QID) | INTRAMUSCULAR | Status: DC | PRN

## 2017-03-19 MED ORDER — SODIUM CHLORIDE 0.9 % IV SOLN: 250.0000 mL | INTRAVENOUS | Status: DC | PRN

## 2017-03-19 MED ORDER — TRAMADOL HCL 50 MG PO TABS: 50.0000 mg | ORAL_TABLET | Freq: Four times a day (QID) | ORAL | Status: DC | PRN

## 2017-03-19 MED ORDER — ACETAMINOPHEN 325 MG PO TABS: 650.0000 mg | ORAL_TABLET | ORAL | Status: DC | PRN

## 2017-03-19 MED ORDER — SODIUM CHLORIDE 0.9% FLUSH: 3.0000 mL | INTRAVENOUS | Status: DC | PRN

## 2017-03-19 MED ORDER — FUROSEMIDE 10 MG/ML IJ SOLN
80.0000 mg | Freq: Once | INTRAMUSCULAR | Status: AC
  Administered 2017-03-19: 80 mg via INTRAVENOUS
  Filled 2017-03-19: qty 8

## 2017-03-19 MED ORDER — SODIUM CHLORIDE 0.9% FLUSH
3.0000 mL | Freq: Two times a day (BID) | INTRAVENOUS | Status: DC
  Administered 2017-03-19 – 2017-03-25 (×9): 3 mL via INTRAVENOUS

## 2017-03-19 MED ORDER — NICOTINE 7 MG/24HR TD PT24: 7.0000 mg | MEDICATED_PATCH | Freq: Every day | TRANSDERMAL | Status: DC

## 2017-03-19 NOTE — ED Notes (Signed)
Patient transported to X-ray 

## 2017-03-19 NOTE — H&P (Signed)
Cardiology History & Physical    Patient ID: Barbara Oliver MRN: [id number], DOB: 28-Sep-1950 Date of Encounter: 03/19/2017, 10:32 PM Primary Physician: Patient, No Pcp Per  Chief Complaint: Shortness of breath   HPI: Barbara Oliver is a 67 y.o. 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  The patient has a known diagnosis of heart failure with reduced ejection fraction for quite some time, however due to difficulties with insurance she has been incompletely compliant with her medical regimen and has not had routine outpatient follow-up.  She had a noninvasive ischemic evaluation at [facility] in 2017 with a stress MRI, which showed no evidence of reversible ischemia.  Over the last 2-3 months she feels her condition has deteriorated, and she is markedly dyspneic even with minimal activity, including brushing her teeth.  She has lost a considerable amount of weight over the past year, probably about 30-40 pounds, although she notes increasing swelling in her abdomen and lower extremities.  She is also had significant PND and orthopnea.  As the patient just turned 49, she was getting connected with a primary care physician, who ordered a baseline echo.  Upon reporting to the echo lab today she was noted to be significantly dyspneic.  They completed the echo (results unavailable), but referred her to the [facility] emergency department for further evaluation.  In the emergency department, she has been normotensive, but tachypneic.  Her initial labs were reassuring.  Her chest x-ray showed mild pulmonary edema.  She was admitted to the cardiology service for further management.  On review of systems, she denies chest pain, palpitations, syncope, neurologic symptoms or bleeding issues.  Past Medical History:  Diagnosis Date  . Acid reflux   . CHF (congestive heart failure) (HCC)   . Mitral regurgitation      Surgical  History:  Past Surgical History:  Procedure Laterality Date  . HERNIA REPAIR       Home Meds: Prior to Admission medications   Medication Sig Start Date End Date Taking? Authorizing Provider  acetaminophen (TYLENOL) 500 MG tablet Take 1,000 mg by mouth daily as needed for mild pain.   Yes [provider]  albuterol (PROVENTIL HFA;VENTOLIN HFA) 108 (90 Base) MCG/ACT inhaler Inhale 1-2 puffs into the lungs every 6 (six) hours as needed for wheezing or shortness of breath. 01/28/17  Yes Caccavale, Sophia, PA-C  aspirin EC 81 MG EC tablet Take 1 tablet (81 mg total) by mouth daily. 03/12/16  Yes Gouru, Deanna Artis, MD  carvedilol (COREG) 6.25 MG tablet Take 1 tablet (6.25 mg total) by mouth 2 (two) times daily. 03/11/16  Yes Gouru, Deanna Artis, MD  furosemide (LASIX) 40 MG tablet Take 1 tablet 2 times a day for the next 3 days.  After this, resume taking 1 tablet once daily Patient taking differently: Take 40 mg by mouth daily.  01/28/17  Yes Caccavale, Sophia, PA-C  ibuprofen (ADVIL,MOTRIN) 200 MG tablet Take 800 mg by mouth daily as needed.   Yes [provider]  pantoprazole (PROTONIX) 40 MG tablet Take 1 tablet (40 mg total) by mouth daily. 03/11/16  Yes Gouru, Deanna Artis, MD  traMADol (ULTRAM) 50 MG tablet Take 1 tablet (50 mg total) by mouth every 6 (six) hours as needed. 12/12/16 12/12/17 Yes Enid Derry, PA-C  azithromycin (ZITHROMAX) 250 MG tablet Take 1 table daily for 4 days Patient not taking: Reported on 01/28/2017 11/23/16   Adrian Saran, MD  fluticasone Aleda Grana)  50 MCG/ACT nasal spray Place 1 spray into both nostrils daily. 01/28/17   Caccavale, Sophia, PA-C  nicotine (NICODERM CQ - DOSED IN MG/24 HR) 7 mg/24hr patch Place 1 patch (7 mg total) onto the skin daily. Patient not taking: Reported on 03/19/2017 11/23/16   Adrian Saran, MD  spironolactone (ALDACTONE) 25 MG tablet Take 0.5 tablets (12.5 mg total) by mouth daily. Patient not taking: Reported on 03/19/2017 01/28/17   Caccavale,  Sophia, PA-C    Allergies: No Known Allergies  Social History   Socioeconomic History  . Marital status: Married    Spouse name: Not on file  . Number of children: Not on file  . Years of education: Not on file  . Highest education level: Not on file  Social Needs  . Financial resource strain: Not on file  . Food insecurity - worry: Not on file  . Food insecurity - inability: Not on file  . Transportation needs - medical: Not on file  . Transportation needs - non-medical: Not on file  Occupational History  . Not on file  Tobacco Use  . Smoking status: Current Some Day Smoker    Packs/day: 0.00    Types: Cigarettes  . Smokeless tobacco: Never Used  . Tobacco comment: Pt reports a pack will last her about a month  Substance and Sexual Activity  . Alcohol use: No  . Drug use: No  . Sexual activity: Not on file  Other Topics Concern  . Not on file  Social History Narrative  . Not on file     Family History  Problem Relation Age of Onset  . Hypertension Mother   . Heart attack Father     Review of Systems: All other systems reviewed and are otherwise negative except as noted above.  Labs:   Lab Results  Component Value Date   WBC 6.5 03/19/2017   HGB 11.7 (L) 03/19/2017   HCT 37.9 03/19/2017   MCV 89.6 03/19/2017   PLT 343 03/19/2017    Recent Labs  Lab 03/19/17 1902  NA 138  K 4.2  CL 109  CO2 20*  BUN 21*  CREATININE 1.09*  CALCIUM 9.1  PROT 7.3  BILITOT 1.4*  ALKPHOS 119  ALT 19  AST 24  GLUCOSE 84   No results for input(s): CKTOTAL, CKMB, TROPONINI in the last 72 hours. No results found for: CHOL, HDL, LDLCALC, TRIG No results found for: DDIMER  Radiology/Studies:  Dg Chest 2 View  Result Date: 03/19/2017 CLINICAL DATA:  67 year old female with history of severe shortness of breath for the past 3 days, worse on exertion. Bilateral lower extremity swelling over the past several days. History of congestive heart failure and mitral  regurgitation. EXAM: CHEST  2 VIEW COMPARISON:  Chest x-ray 01/28/2017. FINDINGS: Mild diffuse peribronchial cuffing and interstitial prominence. Mild cephalization of the pulmonary vasculature. No acute consolidative airspace disease. No pleural effusions. However, there thickening of the major and minor fissures. Moderate cardiomegaly. Upper mediastinal contours are within normal limits. Aortic atherosclerosis. IMPRESSION: 1. The appearance the chest suggests mild congestive heart failure. 2. Aortic atherosclerosis. Electronically Signed   By: Trudie Reed M.D.   On: 03/19/2017 19:55   Wt Readings from Last 3 Encounters:  03/19/17 71.7 kg (158 lb)  01/28/17 71.7 kg (158 lb)  12/12/16 70.3 kg (155 lb)    EKG: Sinus rhythm with frequent PACs.  Left bundle branch block (QRS duration 151 ms).  Biatrial enlargement.  Physical Exam: Blood pressure Marland Kitchen)  124/98, pulse 97, temperature 98.4 F (36.9 C), temperature source Oral, resp. rate (!) 30, height 6\' 1"  (1.854 m), weight 71.7 kg (158 lb), SpO2 100 %. Body mass index is 20.85 kg/m. General: Elderly female, in mild respiratory distress. Head: Normocephalic, atraumatic, sclera non-icteric, no xanthomas, nares are without discharge.  Neck: JVP 12-13. Lungs: Rare basilar crackles Heart: RRR with S1 S2.  3 out of 6 SEM at apex, S3 appreciated Abdomen: Soft, non-tender, non-distended with normoactive bowel sounds.  Mildly distended Msk:  Strength and tone appear normal for age. Extremities: No clubbing or cyanosis.  Trace lower extremity edema.  Lukewarm to touch  neuro: Alert and oriented X 3. No focal deficit. No facial asymmetry. Moves all extremities spontaneously. Psych:  Responds to questions appropriately with a normal affect.    Assessment and Plan  67 year old female with nonischemic cardiomyopathy (EF 20-25%), who presents with acute decompensated heart failure.  1.  Acute on chronic systolic heart failure: Patient is volume  overloaded on exam.  Given that her extremities are lukewarm to the touch, she is likely peripherally vasoconstricted.  We will aggressively diuresis with IV Lasix.  We will start low-dose ACE inhibitor, but will hold home beta-blocker in the setting of decompensation.  She has had a recent echo so does not require this at this point.  She has had a depressed ejection fraction for quite some time, but is not clear that she was ever compliant with goal-directed medical therapy for heart failure; could consider CRT-D either now or some point in the near future (LBBB).  Signed, Esmond Plants, MD 03/19/2017, 10:32 PM

## 2017-03-19 NOTE — ED Provider Notes (Signed)
[facility] EMERGENCY DEPARTMENT Provider Note   CSN: [id number] Arrival date & time: 03/19/17  1654     History   Chief Complaint Chief Complaint  Patient presents with  . Shortness of Breath    HPI Barbara Oliver is a 67 y.o. female.  HPI Has prior history of cardiomyopathy.  She was seen at [facility] were cardiac echo was done.  There was concern for the findings and patient was referred to the emergency department for further evaluation.  Patient reports that she is always short of breath but she feels very short of breath today.  She also notes she has had increased swelling in her legs.  She notes that when her abdomen gets distended she thinks she is building up fluid.  She is getting short of breath very easily with any activity.  He just recently got a primary care doctor and is now getting additional medical care.  She reports her main treatment to date has been a combination of Lasix and carvedilol. Past Medical History:  Diagnosis Date  . Acid reflux   . CHF (congestive heart failure) (HCC)   . Mitral regurgitation     Patient Active Problem List   Diagnosis Date Noted  . Acute on chronic systolic (congestive) heart failure (HCC) 03/19/2017  . Acute CHF (congestive heart failure) (HCC) 11/20/2016  . CHF (congestive heart failure) (HCC) 03/07/2016  . Protein-calorie malnutrition, severe 03/07/2016  . Abdominal pain, epigastric 10/06/2015  . Elevated transaminase level 10/06/2015  . Acute renal insufficiency 10/06/2015  . Hyperglycemia 10/06/2015  . Severe mitral regurgitation 10/06/2015  . 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 Acute on chronic systolic CHF (congestive heart failure) (HCC) 10/05/2015    Past Surgical History:  Procedure Laterality Date  . HERNIA REPAIR      OB History    No data available       Home Medications    Prior to Admission medications   Medication Sig Start Date End Date Taking?  Authorizing Provider  acetaminophen (TYLENOL) 500 MG tablet Take 1,000 mg by mouth daily as needed for mild pain.   Yes [provider]  albuterol (PROVENTIL HFA;VENTOLIN HFA) 108 (90 Base) MCG/ACT inhaler Inhale 1-2 puffs into the lungs every 6 (six) hours as needed for wheezing or shortness of breath. 01/28/17  Yes Caccavale, Sophia, PA-C  aspirin EC 81 MG EC tablet Take 1 tablet (81 mg total) by mouth daily. 03/12/16  Yes Gouru, Deanna Artis, MD  carvedilol (COREG) 6.25 MG tablet Take 1 tablet (6.25 mg total) by mouth 2 (two) times daily. 03/11/16  Yes Gouru, Deanna Artis, MD  furosemide (LASIX) 40 MG tablet Take 1 tablet 2 times a day for the next 3 days.  After this, resume taking 1 tablet once daily Patient taking differently: Take 40 mg by mouth daily.  01/28/17  Yes Caccavale, Sophia, PA-C  ibuprofen (ADVIL,MOTRIN) 200 MG tablet Take 800 mg by mouth daily as needed.   Yes [provider]  pantoprazole (PROTONIX) 40 MG tablet Take 1 tablet (40 mg total) by mouth daily. 03/11/16  Yes Gouru, Deanna Artis, MD  traMADol (ULTRAM) 50 MG tablet Take 1 tablet (50 mg total) by mouth every 6 (six) hours as needed. 12/12/16 12/12/17 Yes Enid Derry, PA-C  azithromycin (ZITHROMAX) 250 MG tablet Take 1 table daily for 4 days Patient not taking: Reported on 01/28/2017 11/23/16   Adrian Saran, MD  fluticasone (FLONASE) 50 MCG/ACT nasal spray Place 1 spray into both nostrils  daily. 01/28/17   Caccavale, Sophia, PA-C  nicotine (NICODERM CQ - DOSED IN MG/24 HR) 7 mg/24hr patch Place 1 patch (7 mg total) onto the skin daily. Patient not taking: Reported on 03/19/2017 11/23/16   Adrian Saran, MD  spironolactone (ALDACTONE) 25 MG tablet Take 0.5 tablets (12.5 mg total) by mouth daily. Patient not taking: Reported on 03/19/2017 01/28/17   Caccavale, Jeanette Caprice, PA-C    Family History Family History  Problem Relation Age of Onset  . Hypertension Mother   . Heart attack Father     Social History Social History   Tobacco Use   . Smoking status: Current Some Day Smoker    Packs/day: 0.00    Types: Cigarettes  . Smokeless tobacco: Never Used  . Tobacco comment: Pt reports a pack will last her about a month  Substance Use Topics  . Alcohol use: No  . Drug use: No     Allergies   Patient has no known allergies.   Review of Systems Review of Systems 10 Systems reviewed and are negative for acute change except as noted in the HPI.  Physical Exam Updated Vital Signs BP 105/68   Pulse 92   Temp 98.4 F (36.9 C) (Oral)   Resp 19   Ht 6\' 1"  (1.854 m)   Wt 71.7 kg (158 lb)   SpO2 100%   BMI 20.85 kg/m   Physical Exam  Constitutional: She is oriented to person, place, and time. She appears well-developed and well-nourished.  Mild increased work of breathing at rest.  HENT:  Head: Normocephalic and atraumatic.  Mouth/Throat: Oropharynx is clear and moist.  Eyes: EOM are normal.  Neck: Neck supple.  Cardiovascular:  Heart is regular 3\6 systolic ejection murmur, S3 gallop.  Pulmonary/Chest:  Moderate tachypnea.  No gross wheeze rhonchi or rale  Abdominal:  Mild abdominal distention.  Soft and nontender.  Musculoskeletal:  1-2+ pitting edema bilateral lower extremities.  Calves nontender.  Neurological: She is alert and oriented to person, place, and time. No cranial nerve deficit. She exhibits normal muscle tone. Coordination normal.  Skin: Skin is warm and dry.  Psychiatric: She has a normal mood and affect.     ED Treatments / Results  Labs (all labs ordered are listed, but only abnormal results are displayed) Labs Reviewed  COMPREHENSIVE METABOLIC PANEL - Abnormal; Notable for the following components:      Result Value   CO2 20 (*)    BUN 21 (*)    Creatinine, Ser 1.09 (*)    Albumin 3.2 (*)    Total Bilirubin 1.4 (*)    GFR calc non Af Amer 52 (*)    All other components within normal limits  BRAIN NATRIURETIC PEPTIDE - Abnormal; Notable for the following components:   B  Natriuretic Peptide 1,726.9 (*)    All other components within normal limits  CBC WITH DIFFERENTIAL/PLATELET - Abnormal; Notable for the following components:   Hemoglobin 11.7 (*)    All other components within normal limits  LIPASE, BLOOD  PROTIME-INR  URINALYSIS, ROUTINE W REFLEX MICROSCOPIC  CBC  HIV ANTIBODY (ROUTINE TESTING)  BASIC METABOLIC PANEL  CREATININE, SERUM  CBC WITH DIFFERENTIAL/PLATELET  I-STAT TROPONIN, ED    EKG  EKG Interpretation  Date/Time:  Wednesday March 19 2017 17:19:51 EST Ventricular Rate:  88 PR Interval:    QRS Duration: 151 QT Interval:  434 QTC Calculation: 526 R Axis:   -54 Text Interpretation:  Sinus rhythm Supraventricular bigeminy LAE, consider biatrial  enlargement Left bundle branch block no sig change from previous Confirmed by Arby Barrette [phone]) on 03/19/2017 6:48:17 PM       Radiology Dg Chest 2 View  Result Date: 03/19/2017 CLINICAL DATA:  67 year old female with history of severe shortness of breath for the past 3 days, worse on exertion. Bilateral lower extremity swelling over the past several days. History of congestive heart failure and mitral regurgitation. EXAM: CHEST  2 VIEW COMPARISON:  Chest x-ray 01/28/2017. FINDINGS: Mild diffuse peribronchial cuffing and interstitial prominence. Mild cephalization of the pulmonary vasculature. No acute consolidative airspace disease. No pleural effusions. However, there thickening of the major and minor fissures. Moderate cardiomegaly. Upper mediastinal contours are within normal limits. Aortic atherosclerosis. IMPRESSION: 1. The appearance the chest suggests mild congestive heart failure. 2. Aortic atherosclerosis. Electronically Signed   By: Trudie Reed M.D.   On: 03/19/2017 19:55    Procedures Procedures (including critical care time)  Medications Ordered in ED Medications  sodium chloride flush (NS) 0.9 % injection 3 mL (3 mLs Intravenous Given 03/19/17 2315)  sodium chloride  flush (NS) 0.9 % injection 3 mL (not administered)  0.9 %  sodium chloride infusion (not administered)  acetaminophen (TYLENOL) tablet 650 mg (not administered)  ondansetron (ZOFRAN) injection 4 mg (not administered)  enoxaparin (LOVENOX) injection 40 mg (not administered)  lisinopril (PRINIVIL,ZESTRIL) tablet 5 mg (not administered)  albuterol (PROVENTIL) (2.5 MG/3ML) 0.083% nebulizer solution 2.5 mg (not administered)  aspirin EC tablet 81 mg (81 mg Oral Given 03/19/17 2311)  fluticasone (FLONASE) 50 MCG/ACT nasal spray 1 spray (not administered)  pantoprazole (PROTONIX) EC tablet 40 mg (40 mg Oral Given 03/19/17 2310)  traMADol (ULTRAM) tablet 50 mg (not administered)  furosemide (LASIX) injection 80 mg (80 mg Intravenous Given 03/19/17 2311)     Initial Impression / Assessment and Plan / ED Course  I have reviewed the triage vital signs and the nursing notes.  Pertinent labs & imaging results that were available during my care of the patient were reviewed by me and considered in my medical decision making (see chart for details).     Consult: Reviewed with cardiology fellow.  Will see patient emergency department for admission  Final Clinical Impressions(s) / ED Diagnoses   Final diagnoses:  Cardiomyopathy, unspecified type (HCC)  Acute on chronic congestive heart failure, unspecified heart failure type [facility])    ED Discharge Orders    None       Arby Barrette, MD 03/20/17 0023

## 2017-03-19 NOTE — ED Notes (Signed)
Cards at bedside

## 2017-03-19 NOTE — ED Triage Notes (Signed)
Pt arrived via EMS from [facility] where pt went in to be evaluated with a stress test, d/t SOB, pt had echo instead of stress test and was found to have low EF. EMS was not sure of EF. Hx of CHF. Pt reports SOB and cough X1 year. C/O abdominal distension X3-4 months and decreased sleep r/t discomfort. EMS placed on 2L [place] for comfort.

## 2017-03-19 NOTE — ED Notes (Signed)
ED Provider at bedside. 

## 2017-03-20 DIAGNOSIS — I340 Nonrheumatic mitral (valve) insufficiency: Secondary | ICD-10-CM | POA: Diagnosis not present

## 2017-03-20 DIAGNOSIS — I447 Left bundle-branch block, unspecified: Secondary | ICD-10-CM | POA: Diagnosis not present

## 2017-03-20 DIAGNOSIS — Z9119 Patient's noncompliance with other medical treatment and regimen: Secondary | ICD-10-CM | POA: Diagnosis not present

## 2017-03-20 DIAGNOSIS — I5023 Acute on chronic systolic (congestive) heart failure: Secondary | ICD-10-CM | POA: Diagnosis not present

## 2017-03-20 DIAGNOSIS — R911 Solitary pulmonary nodule: Secondary | ICD-10-CM | POA: Diagnosis not present

## 2017-03-20 DIAGNOSIS — F17210 Nicotine dependence, cigarettes, uncomplicated: Secondary | ICD-10-CM | POA: Diagnosis not present

## 2017-03-20 DIAGNOSIS — N183 Chronic kidney disease, stage 3 (moderate): Secondary | ICD-10-CM | POA: Diagnosis not present

## 2017-03-20 DIAGNOSIS — Z79899 Other long term (current) drug therapy: Secondary | ICD-10-CM | POA: Diagnosis not present

## 2017-03-20 DIAGNOSIS — I429 Cardiomyopathy, unspecified: Secondary | ICD-10-CM | POA: Diagnosis not present

## 2017-03-20 LAB — CBC WITH DIFFERENTIAL/PLATELET
Basophils Absolute: 0 10*3/uL (ref 0.0–0.1)
Basophils Relative: 1 %
Eosinophils Absolute: 0.1 10*3/uL (ref 0.0–0.7)
Eosinophils Relative: 3 %
HCT: 39.5 % (ref 36.0–46.0)
Hemoglobin: 12.4 g/dL (ref 12.0–15.0)
Lymphocytes Relative: 38 %
Lymphs Abs: 1.9 10*3/uL (ref 0.7–4.0)
MCH: 28.2 pg (ref 26.0–34.0)
MCHC: 31.4 g/dL (ref 30.0–36.0)
MCV: 90 fL (ref 78.0–100.0)
Monocytes Absolute: 0.5 10*3/uL (ref 0.1–1.0)
Monocytes Relative: 9 %
Neutro Abs: 2.6 10*3/uL (ref 1.7–7.7)
Neutrophils Relative %: 49 %
Platelets: 359 10*3/uL (ref 150–400)
RBC: 4.39 MIL/uL (ref 3.87–5.11)
RDW: 15.5 % (ref 11.5–15.5)
WBC: 5.1 10*3/uL (ref 4.0–10.5)

## 2017-03-20 LAB — CREATININE, SERUM
Creatinine, Ser: 1.16 mg/dL — ABNORMAL HIGH (ref 0.44–1.00)
GFR calc Af Amer: 56 mL/min — ABNORMAL LOW (ref >60)
GFR calc non Af Amer: 48 mL/min — ABNORMAL LOW (ref >60)

## 2017-03-20 LAB — BASIC METABOLIC PANEL
ANION GAP: 10 (ref 5–15)
BUN: 21 mg/dL — ABNORMAL HIGH (ref 6–20)
CO2: 23 mmol/L (ref 22–32)
Calcium: 9.1 mg/dL (ref 8.9–10.3)
Chloride: 105 mmol/L (ref 101–111)
Creatinine, Ser: 1.07 mg/dL — ABNORMAL HIGH (ref 0.44–1.00)
GFR calc Af Amer: >60 mL/min (ref >60)
GFR, EST NON AFRICAN AMERICAN: 53 mL/min — AB (ref >60)
Glucose, Bld: 85 mg/dL (ref 65–99)
POTASSIUM: 3.8 mmol/L (ref 3.5–5.1)
SODIUM: 138 mmol/L (ref 135–145)

## 2017-03-20 LAB — HIV ANTIBODY (ROUTINE TESTING W REFLEX): HIV Screen 4th Generation wRfx: NONREACTIVE

## 2017-03-20 MED ORDER — FUROSEMIDE 10 MG/ML IJ SOLN
40.0000 mg | Freq: Two times a day (BID) | INTRAMUSCULAR | Status: DC
  Administered 2017-03-20: 40 mg via INTRAVENOUS
  Filled 2017-03-20: qty 4

## 2017-03-20 MED ORDER — SODIUM CHLORIDE 0.9% FLUSH
3.0000 mL | Freq: Two times a day (BID) | INTRAVENOUS | Status: DC
  Administered 2017-03-21: 3 mL via INTRAVENOUS

## 2017-03-20 MED ORDER — SPIRONOLACTONE 25 MG PO TABS
25.0000 mg | ORAL_TABLET | Freq: Every day | ORAL | Status: DC
  Administered 2017-03-20: 25 mg via ORAL
  Filled 2017-03-20: qty 1

## 2017-03-20 MED ORDER — SODIUM CHLORIDE 0.9% FLUSH: 3.0000 mL | INTRAVENOUS | Status: DC | PRN

## 2017-03-20 MED ORDER — SODIUM CHLORIDE 0.9 % IV SOLN
INTRAVENOUS | Status: DC
  Administered 2017-03-21: 07:00:00 via INTRAVENOUS

## 2017-03-20 MED ORDER — SODIUM CHLORIDE 0.9 % IV SOLN: 250.0000 mL | INTRAVENOUS | Status: DC | PRN

## 2017-03-20 MED ORDER — IRBESARTAN 150 MG PO TABS
75.0000 mg | ORAL_TABLET | Freq: Every day | ORAL | Status: DC
  Administered 2017-03-20 – 2017-03-21 (×2): 75 mg via ORAL
  Administered 2017-03-22: 09:00:00 via ORAL
  Administered 2017-03-23 – 2017-03-25 (×3): 75 mg via ORAL
  Filled 2017-03-20 (×6): qty 1

## 2017-03-20 MED ORDER — ASPIRIN 81 MG PO CHEW
81.0000 mg | CHEWABLE_TABLET | ORAL | Status: AC
  Administered 2017-03-21: 81 mg via ORAL
  Filled 2017-03-20: qty 1

## 2017-03-20 NOTE — Progress Notes (Signed)
Progress Note  Patient Name: Barbara Oliver Date of Encounter: 03/20/2017  Primary Cardiologist: New; Dr Jens Som  Subjective   Dyspnea improving; no chest pain  Inpatient Medications    Scheduled Meds: . aspirin EC  81 mg Oral Daily  . enoxaparin (LOVENOX) injection  40 mg Subcutaneous Daily  . fluticasone  1 spray Each Nare Daily  . lisinopril  5 mg Oral Daily  . pantoprazole  40 mg Oral Daily  . sodium chloride flush  3 mL Intravenous Q12H   Continuous Infusions: . sodium chloride     PRN Meds: sodium chloride, acetaminophen, albuterol, ondansetron (ZOFRAN) IV, sodium chloride flush, traMADol   Vital Signs    Vitals:   03/20/17 0345 03/20/17 0500 03/20/17 0600 03/20/17 0700  BP: 119/86 101/60 93/60 111/72  Pulse: 81 78 73 76  Resp: 18 16 16 16   Temp:      TempSrc:      SpO2: 100% 96% 100% 97%  Weight:      Height:        Intake/Output Summary (Last 24 hours) at 03/20/2017 0841 Last data filed at 03/20/2017 0728 Gross per 24 hour  Intake 3 ml  Output 4300 ml  Net -4297 ml   Filed Weights   03/19/17 1719  Weight: 158 lb (71.7 kg)    Telemetry    Sinus with PVCs and 5 beats NSVT; Personally Reviewed   Physical Exam   GEN: No acute distress.   Neck: supple Cardiac: RRR, 2/6 systolic murmur apex Respiratory: Mildly diminished BS bases GI: Soft, nontender, non-distended  MS: No edema Neuro:  Nonfocal  Psych: Normal affect   Labs    Chemistry Recent Labs  Lab 03/19/17 1902 03/19/17 2319 03/20/17 0316  NA 138  --  138  K 4.2  --  3.8  CL 109  --  105  CO2 20*  --  23  GLUCOSE 84  --  85  BUN 21*  --  21*  CREATININE 1.09* 1.16* 1.07*  CALCIUM 9.1  --  9.1  PROT 7.3  --   --   ALBUMIN 3.2*  --   --   AST 24  --   --   ALT 19  --   --   ALKPHOS 119  --   --   BILITOT 1.4*  --   --   GFRNONAA 52* 48* 53*  GFRAA >60 56* >60  ANIONGAP 9  --  10     Hematology Recent Labs  Lab 03/19/17 1902 03/19/17 2319 03/20/17 0316  WBC 6.5  5.9 5.1  RBC 4.23 4.42 4.39  HGB 11.7* 12.7 12.4  HCT 37.9 40.1 39.5  MCV 89.6 90.7 90.0  MCH 27.7 28.7 28.2  MCHC 30.9 31.7 31.4  RDW 15.4 15.5 15.5  PLT 343 303 359     Recent Labs  Lab 03/19/17 1923  TROPIPOC 0.02     BNP Recent Labs  Lab 03/19/17 1902  BNP 1,726.9*      Radiology    Dg Chest 2 View  Result Date: 03/19/2017 CLINICAL DATA:  67 year old female with history of severe shortness of breath for the past 3 days, worse on exertion. Bilateral lower extremity swelling over the past several days. History of congestive heart failure and mitral regurgitation. EXAM: CHEST  2 VIEW COMPARISON:  Chest x-ray 01/28/2017. FINDINGS: Mild diffuse peribronchial cuffing and interstitial prominence. Mild cephalization of the pulmonary vasculature. No acute consolidative airspace disease. No pleural effusions. However, there thickening  of the major and minor fissures. Moderate cardiomegaly. Upper mediastinal contours are within normal limits. Aortic atherosclerosis. IMPRESSION: 1. The appearance the chest suggests mild congestive heart failure. 2. Aortic atherosclerosis. Electronically Signed   By: Trudie Reed M.D.   On: 03/19/2017 19:55    Patient Profile     67 y.o. female with past medical history of presumed nonischemic cardiomyopathy admitted with acute on chronic systolic congestive heart failure.  Last echocardiogram September 2018 showed ejection fraction 20-25%, mild aortic insufficiency, moderate mitral regurgitation, mild biatrial enlargement and mild right ventricular enlargement.  Assessment & Plan    1 acute on chronic systolic congestive heart failure-symptoms are improving. I/O - [id number]. Wt 158.  Will treat with Lasix 40 mg IV twice daily.  Add Spironolactone 25 mg daily.  Follow renal function closely.  Fluid restrict to 1.5 L daily.  Low-sodium diet.  2 presumed nonischemic cardiomyopathy-previous MRI suggested nonischemic cardiomyopathy at [facility]. Chest  CT 9/18 showed coronary calcification.  I feel definitive evaluation is indicated.  Plan right and left cardiac catheterization tomorrow.  The risks and benefits including myocardial infarction, CVA and death discussed and she agrees to proceed.  Discontinue lisinopril.  Instead we will treat with ARB and transition to [facility] in the near future.  Beta-blockade on hold given acute CHF.  Add carvedilol as tolerated by blood pressure once she improves.  Note she has a left bundle branch block at baseline.  Would plan to repeat echocardiogram 3 months after medications titrated.  If ejection fraction less than 35% would need to consider CRT-D.  3 tobacco abuse-patient counseled on discontinuing.  4 history of noncompliance-patient is now 48 and is establishing with primary care.  We discussed the importance of compliance with medications.  5 MR-moderate on most recent echo; hopefully would improve with medical therapy +- CRT in future.  6 Lung nodule on previous CT-FU study 9/19.  For questions or updates, please contact [facility] Please consult [email or web address] for contact info under Cardiology/STEMI.      Signed, Olga Millers, MD  03/20/2017, 8:41 AM

## 2017-03-21 ENCOUNTER — Encounter (HOSPITAL_COMMUNITY)
Admission: EM | Disposition: A | Discharge status: Home / Self Care | Payer: Self-pay | Source: Home / Self Care | Attending: Cardiology

## 2017-03-21 DIAGNOSIS — I5021 Acute systolic (congestive) heart failure: Secondary | ICD-10-CM

## 2017-03-21 HISTORY — PX: RIGHT/LEFT HEART CATH AND CORONARY ANGIOGRAPHY: CATH118266

## 2017-03-21 LAB — BASIC METABOLIC PANEL
Anion gap: 12 (ref 5–15)
BUN: 26 mg/dL — AB (ref 6–20)
CO2: 24 mmol/L (ref 22–32)
CREATININE: 1.23 mg/dL — AB (ref 0.44–1.00)
Calcium: 9.5 mg/dL (ref 8.9–10.3)
Chloride: 101 mmol/L (ref 101–111)
GFR calc Af Amer: 52 mL/min — ABNORMAL LOW (ref >60)
GFR, EST NON AFRICAN AMERICAN: 45 mL/min — AB (ref >60)
Glucose, Bld: 87 mg/dL (ref 65–99)
POTASSIUM: 3.4 mmol/L — AB (ref 3.5–5.1)
SODIUM: 137 mmol/L (ref 135–145)

## 2017-03-21 LAB — POCT I-STAT 3, VENOUS BLOOD GAS (G3P V)
Acid-base deficit: 5 mmol/L — ABNORMAL HIGH (ref 0.0–2.0)
Acid-base deficit: 2 mmol/L (ref 0.0–2.0)
Bicarbonate: 19.5 mmol/L — ABNORMAL LOW (ref 20.0–28.0)
Bicarbonate: 22.2 mmol/L (ref 20.0–28.0)
O2 Saturation: 58 %
O2 Saturation: 57 %
PCO2 VEN: 37.2 mmHg — AB (ref 44.0–60.0)
PH VEN: 7.375 (ref 7.250–7.430)
PH VEN: 7.384 (ref 7.250–7.430)
PO2 VEN: 30 mmHg — AB (ref 32.0–45.0)
PO2 VEN: 31 mmHg — AB (ref 32.0–45.0)
TCO2: 21 mmol/L — ABNORMAL LOW (ref 22–32)
TCO2: 23 mmol/L (ref 22–32)
pCO2, Ven: 33.4 mmHg — ABNORMAL LOW (ref 44.0–60.0)

## 2017-03-21 LAB — GLUCOSE, CAPILLARY: Glucose-Capillary: 90 mg/dL (ref 65–99)

## 2017-03-21 LAB — CREATININE, SERUM
Creatinine, Ser: 1.28 mg/dL — ABNORMAL HIGH (ref 0.44–1.00)
GFR calc non Af Amer: 43 mL/min — ABNORMAL LOW (ref >60)
GFR, EST AFRICAN AMERICAN: 50 mL/min — AB (ref >60)

## 2017-03-21 LAB — CBC
HCT: 46.3 % — ABNORMAL HIGH (ref 36.0–46.0)
Hemoglobin: 14.9 g/dL (ref 12.0–15.0)
MCH: 28.9 pg (ref 26.0–34.0)
MCHC: 32.2 g/dL (ref 30.0–36.0)
MCV: 89.9 fL (ref 78.0–100.0)
PLATELETS: 393 10*3/uL (ref 150–400)
RBC: 5.15 MIL/uL — AB (ref 3.87–5.11)
RDW: 15.2 % (ref 11.5–15.5)
WBC: 6.5 10*3/uL (ref 4.0–10.5)

## 2017-03-21 LAB — POCT I-STAT 3, ART BLOOD GAS (G3+)
Acid-base deficit: 3 mmol/L — ABNORMAL HIGH (ref 0.0–2.0)
Bicarbonate: 20.7 mmol/L (ref 20.0–28.0)
O2 Saturation: 90 %
PCO2 ART: 32 mmHg (ref 32.0–48.0)
PH ART: 7.418 (ref 7.350–7.450)
TCO2: 22 mmol/L (ref 22–32)
pO2, Arterial: 56 mmHg — ABNORMAL LOW (ref 83.0–108.0)

## 2017-03-21 LAB — MRSA PCR SCREENING: MRSA BY PCR: NEGATIVE

## 2017-03-21 SURGERY — RIGHT/LEFT HEART CATH AND CORONARY ANGIOGRAPHY
Anesthesia: LOCAL

## 2017-03-21 MED ORDER — SODIUM CHLORIDE 0.9% FLUSH
3.0000 mL | Freq: Two times a day (BID) | INTRAVENOUS | Status: DC
  Administered 2017-03-22 – 2017-03-25 (×4): 3 mL via INTRAVENOUS

## 2017-03-21 MED ORDER — FENTANYL CITRATE (PF) 100 MCG/2ML IJ SOLN
INTRAMUSCULAR | Status: DC | PRN
  Administered 2017-03-21: 25 ug via INTRAVENOUS

## 2017-03-21 MED ORDER — MIDAZOLAM HCL 2 MG/2ML IJ SOLN
INTRAMUSCULAR | Status: AC
  Filled 2017-03-21: qty 2

## 2017-03-21 MED ORDER — ONDANSETRON HCL 4 MG/2ML IJ SOLN: 4.0000 mg | Freq: Four times a day (QID) | INTRAMUSCULAR | Status: DC | PRN

## 2017-03-21 MED ORDER — STUDY - GALACTIC STUDY - PLACEBO / OMECAMTIV MECARBIL (PI-MCLEAN)
1.0000 | Freq: Two times a day (BID) | Status: DC
  Administered 2017-03-21 – 2017-03-25 (×8): 1 via ORAL
  Filled 2017-03-21 (×9): qty 1

## 2017-03-21 MED ORDER — VERAPAMIL HCL 2.5 MG/ML IV SOLN
INTRAVENOUS | Status: AC
  Filled 2017-03-21: qty 2

## 2017-03-21 MED ORDER — STUDY - GALACTIC STUDY - PLACEBO / OMECAMTIV MECARBIL (PI-MCLEAN)
1.0000 | Freq: Once | Status: AC
  Administered 2017-03-21: 1 via ORAL
  Filled 2017-03-21: qty 1

## 2017-03-21 MED ORDER — LIDOCAINE HCL (PF) 1 % IJ SOLN
INTRAMUSCULAR | Status: AC
  Filled 2017-03-21: qty 30

## 2017-03-21 MED ORDER — HEPARIN (PORCINE) IN NACL 2-0.9 UNIT/ML-% IJ SOLN
INTRAMUSCULAR | Status: AC
  Filled 2017-03-21: qty 1000

## 2017-03-21 MED ORDER — HEPARIN SODIUM (PORCINE) 1000 UNIT/ML IJ SOLN
INTRAMUSCULAR | Status: DC | PRN
  Administered 2017-03-21: 3000 [IU] via INTRAVENOUS

## 2017-03-21 MED ORDER — MIDAZOLAM HCL 2 MG/2ML IJ SOLN
INTRAMUSCULAR | Status: DC | PRN
  Administered 2017-03-21: 1 mg via INTRAVENOUS

## 2017-03-21 MED ORDER — POTASSIUM CHLORIDE CRYS ER 20 MEQ PO TBCR
40.0000 meq | EXTENDED_RELEASE_TABLET | ORAL | Status: AC
  Administered 2017-03-21: 40 meq via ORAL
  Filled 2017-03-21: qty 2

## 2017-03-21 MED ORDER — IOPAMIDOL (ISOVUE-370) INJECTION 76%
INTRAVENOUS | Status: AC
  Filled 2017-03-21: qty 100

## 2017-03-21 MED ORDER — VERAPAMIL HCL 2.5 MG/ML IV SOLN
INTRAVENOUS | Status: DC | PRN
  Administered 2017-03-21: 10 mL via INTRA_ARTERIAL

## 2017-03-21 MED ORDER — SODIUM CHLORIDE 0.9 % IV SOLN: 250.0000 mL | INTRAVENOUS | Status: DC | PRN

## 2017-03-21 MED ORDER — SODIUM CHLORIDE 0.9 % IV SOLN
INTRAVENOUS | Status: AC
  Administered 2017-03-21: 15:00:00 via INTRAVENOUS

## 2017-03-21 MED ORDER — HEPARIN (PORCINE) IN NACL 2-0.9 UNIT/ML-% IJ SOLN
INTRAMUSCULAR | Status: DC | PRN
  Administered 2017-03-21: 15:00:00

## 2017-03-21 MED ORDER — HEPARIN SODIUM (PORCINE) 1000 UNIT/ML IJ SOLN
INTRAMUSCULAR | Status: AC
  Filled 2017-03-21: qty 1

## 2017-03-21 MED ORDER — IOPAMIDOL (ISOVUE-370) INJECTION 76%
INTRAVENOUS | Status: DC | PRN
  Administered 2017-03-21: 50 mL

## 2017-03-21 MED ORDER — SODIUM CHLORIDE 0.9% FLUSH: 3.0000 mL | INTRAVENOUS | Status: DC | PRN

## 2017-03-21 MED ORDER — ACETAMINOPHEN 325 MG PO TABS: 650.0000 mg | ORAL_TABLET | ORAL | Status: DC | PRN

## 2017-03-21 MED ORDER — FENTANYL CITRATE (PF) 100 MCG/2ML IJ SOLN
INTRAMUSCULAR | Status: AC
  Filled 2017-03-21: qty 2

## 2017-03-21 MED ORDER — LIDOCAINE HCL (PF) 1 % IJ SOLN
INTRAMUSCULAR | Status: DC | PRN
  Administered 2017-03-21 (×2): 2 mL

## 2017-03-21 MED ORDER — ENOXAPARIN SODIUM 40 MG/0.4ML ~~LOC~~ SOLN
40.0000 mg | SUBCUTANEOUS | Status: DC
  Administered 2017-03-22 – 2017-03-24 (×3): 40 mg via SUBCUTANEOUS
  Filled 2017-03-21 (×3): qty 0.4

## 2017-03-21 SURGICAL SUPPLY — 17 items
CATH 5FR JL3.5 JR4 ANG PIG MP (CATHETERS) ×2 IMPLANT
CATH BALLN WEDGE 5F 110CM (CATHETERS) ×2 IMPLANT
CATH SWAN GANZ 7F STRAIGHT (CATHETERS) IMPLANT
COVER PRB 48X5XTLSCP FOLD TPE (BAG) ×1 IMPLANT
COVER PROBE 5X48 (BAG) ×1
DEVICE RAD TR BAND REGULAR (VASCULAR PRODUCTS) ×2 IMPLANT
GLIDESHEATH SLEND SS 6F .021 (SHEATH) ×2 IMPLANT
GUIDEWIRE .025 260CM (WIRE) ×2 IMPLANT
GUIDEWIRE INQWIRE 1.5J.035X260 (WIRE) ×1 IMPLANT
INQWIRE 1.5J .035X260CM (WIRE) ×2
KIT HEART LEFT (KITS) ×2 IMPLANT
KIT MICROINTRODUCER STIFF 5F (SHEATH) ×2 IMPLANT
PACK CARDIAC CATHETERIZATION (CUSTOM PROCEDURE TRAY) ×2 IMPLANT
SHEATH GLIDE SLENDER 4/5FR (SHEATH) ×2 IMPLANT
SHEATH PINNACLE 7F 10CM (SHEATH) IMPLANT
TRANSDUCER W/STOPCOCK (MISCELLANEOUS) ×2 IMPLANT
TUBING CIL FLEX 10 FLL-RA (TUBING) ×2 IMPLANT

## 2017-03-21 NOTE — Research (Signed)
Galactic-HFInformed Consent  Subject Name: Barbara Oliver  Subject met inclusion and exclusion criteria. The informed consent form, study requirements and expectations were reviewed with the subject and questions and concerns were addressed prior to the signing of the consent form. The subject verbalized understanding of the trail requirements. The subject agreed to participate in the Galactic-HFtrial and signed the informed consent. The informed consent was obtained prior to performance of any protocol-specific procedures for the subject. A copy of the signed informed consent was given to the subject and a copy was placed in the subject's medical record.  Duncan Dull, RN 03/21/2017

## 2017-03-21 NOTE — Research (Signed)
RESEARCH ENCOUNTER  Patient ID: Barbara Oliver  DOB: 01/15/1951  [name] met inclusion/exclusion criteria for the Galactic-HF Study.  Spoke with patient at bedside.  Provided pt with Galactic-HF Study information and informed consent.  Answered questions and concerns.  Informed consent left at bedside for patient to review in private.  Will follow up with patient after cardiac catheterization.      

## 2017-03-21 NOTE — Progress Notes (Addendum)
Progress Note  Patient Name: Barbara Oliver Date of Encounter: 03/21/2017  Primary Cardiologist: New; Dr Jens Som  Subjective   No CP; dyspnea continues to improve  Inpatient Medications    Scheduled Meds: . aspirin EC  81 mg Oral Daily  . enoxaparin (LOVENOX) injection  40 mg Subcutaneous Daily  . fluticasone  1 spray Each Nare Daily  . furosemide  40 mg Intravenous BID  . irbesartan  75 mg Oral Daily  . pantoprazole  40 mg Oral Daily  . potassium chloride  40 mEq Oral STAT  . sodium chloride flush  3 mL Intravenous Q12H  . sodium chloride flush  3 mL Intravenous Q12H  . spironolactone  25 mg Oral Daily   Continuous Infusions: . sodium chloride    . sodium chloride    . sodium chloride 10 mL/hr at 03/21/17 0656   PRN Meds: sodium chloride, sodium chloride, acetaminophen, albuterol, ondansetron (ZOFRAN) IV, sodium chloride flush, sodium chloride flush, traMADol   Vital Signs    Vitals:   03/20/17 2252 03/20/17 2341 03/21/17 0313 03/21/17 0500  BP:  (!) 86/64 99/62   Pulse:   81   Resp:  18 (!) 23   Temp: 97.9 F (36.6 C)  98.5 F (36.9 C)   TempSrc: Oral  Oral   SpO2:   90%   Weight: 144 lb 10 oz (65.6 kg)   144 lb 6.4 oz (65.5 kg)  Height: 6\' 1"  (1.854 m)      No intake or output data in the 24 hours ending 03/21/17 0745 Filed Weights   03/19/17 1719 03/20/17 2252 03/21/17 0500  Weight: 158 lb (71.7 kg) 144 lb 10 oz (65.6 kg) 144 lb 6.4 oz (65.5 kg)    Telemetry    Sinus; Personally Reviewed   Physical Exam   GEN: WD, WN No acute distress.   Neck: JVP 10 cm Cardiac: RRR with 2/6 systolic murmur apex Respiratory: CTA GI: Soft, nontender, non-distended, no masses MS: No edema Neuro:  grossly intact   Labs    Chemistry Recent Labs  Lab 03/19/17 1902 03/19/17 2319 03/20/17 0316 03/21/17 0228  NA 138  --  138 137  K 4.2  --  3.8 3.4*  CL 109  --  105 101  CO2 20*  --  23 24  GLUCOSE 84  --  85 87  BUN 21*  --  21* 26*  CREATININE  1.09* 1.16* 1.07* 1.23*  CALCIUM 9.1  --  9.1 9.5  PROT 7.3  --   --   --   ALBUMIN 3.2*  --   --   --   AST 24  --   --   --   ALT 19  --   --   --   ALKPHOS 119  --   --   --   BILITOT 1.4*  --   --   --   GFRNONAA 52* 48* 53* 45*  GFRAA >60 56* >60 52*  ANIONGAP 9  --  10 12     Hematology Recent Labs  Lab 03/19/17 1902 03/19/17 2319 03/20/17 0316  WBC 6.5 5.9 5.1  RBC 4.23 4.42 4.39  HGB 11.7* 12.7 12.4  HCT 37.9 40.1 39.5  MCV 89.6 90.7 90.0  MCH 27.7 28.7 28.2  MCHC 30.9 31.7 31.4  RDW 15.4 15.5 15.5  PLT 343 303 359     Recent Labs  Lab 03/19/17 1923  TROPIPOC 0.02     BNP Recent  Labs  Lab 03/19/17 1902  BNP 1,726.9*      Radiology    Dg Chest 2 View  Result Date: 03/19/2017 CLINICAL DATA:  67 year old female with history of severe shortness of breath for the past 3 days, worse on exertion. Bilateral lower extremity swelling over the past several days. History of congestive heart failure and mitral regurgitation. EXAM: CHEST  2 VIEW COMPARISON:  Chest x-ray 01/28/2017. FINDINGS: Mild diffuse peribronchial cuffing and interstitial prominence. Mild cephalization of the pulmonary vasculature. No acute consolidative airspace disease. No pleural effusions. However, there thickening of the major and minor fissures. Moderate cardiomegaly. Upper mediastinal contours are within normal limits. Aortic atherosclerosis. IMPRESSION: 1. The appearance the chest suggests mild congestive heart failure. 2. Aortic atherosclerosis. Electronically Signed   By: Trudie Reed M.D.   On: 03/19/2017 19:55    Patient Profile     67 y.o. female with past medical history of presumed nonischemic cardiomyopathy admitted with acute on chronic systolic congestive heart failure.  Last echocardiogram September 2018 showed ejection fraction 20-25%, mild aortic insufficiency, moderate mitral regurgitation, mild biatrial enlargement and mild right ventricular enlargement.  Assessment &  Plan    1 acute on chronic systolic congestive heart failure-symptoms are improving. I/O - 1000. Wt 144.  Hold diuretics this AM prior to cath; resume lasix and spironolactone in AM if renal function stable. Repeat BMET in AM. Fluid restrict to 1.5 L daily.  Low-sodium diet.  2 presumed nonischemic cardiomyopathy-previous MRI suggested nonischemic cardiomyopathy at [facility]. Chest CT 9/18 showed coronary calcification. Plan definitive evaluation today with right and left cardiac catheterization.  The risks and benefits including myocardial infarction, CVA and death discussed and she agrees to proceed. Hold diuretics precath; no Vgram; follow renal function after procedure. Continue low dose avapro; transition to entresto later if BP allows (borderline at present).  Beta-blockade on hold given acute CHF.  Add carvedilol as tolerated by blood pressure once she improves.  Note she has a left bundle branch block at baseline.  Would plan to repeat echocardiogram 3 months after medications titrated.  If ejection fraction less than 35% would need to consider CRT-D.  3 tobacco abuse-patient counseled on discontinuing.  4 history of noncompliance-patient previously educated on importance of compliance.  5 MR-moderate on most recent echo; hopefully would improve with medical therapy +- CRT in future.  6 Lung nodule on previous CT-FU study 9/19.  7 acute kidney disease-Cr mildly increased from diuresis; hold diuretics prior to cath and follow renal function after procedure.  For questions or updates, please contact [facility] Please consult [email or web address] for contact info under Cardiology/STEMI.      Signed, Olga Millers, MD  03/21/2017, 7:45 AM

## 2017-03-21 NOTE — Interval H&P Note (Signed)
History and Physical Interval Note:  03/21/2017 12:52 PM  Barbara Oliver  has presented today for surgery, with the diagnosis of CHF  The various methods of treatment have been discussed with the patient and family. After consideration of risks, benefits and other options for treatment, the patient has consented to  Procedure(s): RIGHT/LEFT HEART CATH AND CORONARY ANGIOGRAPHY (N/A) and possible coronary angioplasty as a surgical intervention .  The patient's history has been reviewed, patient examined, no change in status, stable for surgery.  I have reviewed the patient's chart and labs.  Questions were answered to the patient's satisfaction.     Shellia Hartl

## 2017-03-21 NOTE — H&P (View-Only) (Signed)
Progress Note  Patient Name: Barbara Oliver Date of Encounter: 03/21/2017  Primary Cardiologist: New; Dr Jens Som  Subjective   No CP; dyspnea continues to improve  Inpatient Medications    Scheduled Meds: . aspirin EC  81 mg Oral Daily  . enoxaparin (LOVENOX) injection  40 mg Subcutaneous Daily  . fluticasone  1 spray Each Nare Daily  . furosemide  40 mg Intravenous BID  . irbesartan  75 mg Oral Daily  . pantoprazole  40 mg Oral Daily  . potassium chloride  40 mEq Oral STAT  . sodium chloride flush  3 mL Intravenous Q12H  . sodium chloride flush  3 mL Intravenous Q12H  . spironolactone  25 mg Oral Daily   Continuous Infusions: . sodium chloride    . sodium chloride    . sodium chloride 10 mL/hr at 03/21/17 0656   PRN Meds: sodium chloride, sodium chloride, acetaminophen, albuterol, ondansetron (ZOFRAN) IV, sodium chloride flush, sodium chloride flush, traMADol   Vital Signs    Vitals:   03/20/17 2252 03/20/17 2341 03/21/17 0313 03/21/17 0500  BP:  (!) 86/64 99/62   Pulse:   81   Resp:  18 (!) 23   Temp: 97.9 F (36.6 C)  98.5 F (36.9 C)   TempSrc: Oral  Oral   SpO2:   90%   Weight: 144 lb 10 oz (65.6 kg)   144 lb 6.4 oz (65.5 kg)  Height: 6\' 1"  (1.854 m)      No intake or output data in the 24 hours ending 03/21/17 0745 Filed Weights   03/19/17 1719 03/20/17 2252 03/21/17 0500  Weight: 158 lb (71.7 kg) 144 lb 10 oz (65.6 kg) 144 lb 6.4 oz (65.5 kg)    Telemetry    Sinus; Personally Reviewed   Physical Exam   GEN: WD, WN No acute distress.   Neck: JVP 10 cm Cardiac: RRR with 2/6 systolic murmur apex Respiratory: CTA GI: Soft, nontender, non-distended, no masses MS: No edema Neuro:  grossly intact   Labs    Chemistry Recent Labs  Lab 03/19/17 1902 03/19/17 2319 03/20/17 0316 03/21/17 0228  NA 138  --  138 137  K 4.2  --  3.8 3.4*  CL 109  --  105 101  CO2 20*  --  23 24  GLUCOSE 84  --  85 87  BUN 21*  --  21* 26*  CREATININE  1.09* 1.16* 1.07* 1.23*  CALCIUM 9.1  --  9.1 9.5  PROT 7.3  --   --   --   ALBUMIN 3.2*  --   --   --   AST 24  --   --   --   ALT 19  --   --   --   ALKPHOS 119  --   --   --   BILITOT 1.4*  --   --   --   GFRNONAA 52* 48* 53* 45*  GFRAA >60 56* >60 52*  ANIONGAP 9  --  10 12     Hematology Recent Labs  Lab 03/19/17 1902 03/19/17 2319 03/20/17 0316  WBC 6.5 5.9 5.1  RBC 4.23 4.42 4.39  HGB 11.7* 12.7 12.4  HCT 37.9 40.1 39.5  MCV 89.6 90.7 90.0  MCH 27.7 28.7 28.2  MCHC 30.9 31.7 31.4  RDW 15.4 15.5 15.5  PLT 343 303 359     Recent Labs  Lab 03/19/17 1923  TROPIPOC 0.02     BNP Recent  Labs  Lab 03/19/17 1902  BNP 1,726.9*      Radiology    Dg Chest 2 View  Result Date: 03/19/2017 CLINICAL DATA:  67 year old female with history of severe shortness of breath for the past 3 days, worse on exertion. Bilateral lower extremity swelling over the past several days. History of congestive heart failure and mitral regurgitation. EXAM: CHEST  2 VIEW COMPARISON:  Chest x-ray 01/28/2017. FINDINGS: Mild diffuse peribronchial cuffing and interstitial prominence. Mild cephalization of the pulmonary vasculature. No acute consolidative airspace disease. No pleural effusions. However, there thickening of the major and minor fissures. Moderate cardiomegaly. Upper mediastinal contours are within normal limits. Aortic atherosclerosis. IMPRESSION: 1. The appearance the chest suggests mild congestive heart failure. 2. Aortic atherosclerosis. Electronically Signed   By: Trudie Reed M.D.   On: 03/19/2017 19:55    Patient Profile     67 y.o. female with past medical history of presumed nonischemic cardiomyopathy admitted with acute on chronic systolic congestive heart failure.  Last echocardiogram September 2018 showed ejection fraction 20-25%, mild aortic insufficiency, moderate mitral regurgitation, mild biatrial enlargement and mild right ventricular enlargement.  Assessment &  Plan    1 acute on chronic systolic congestive heart failure-symptoms are improving. I/O - 1000. Wt 144.  Hold diuretics this AM prior to cath; resume lasix and spironolactone in AM if renal function stable. Repeat BMET in AM. Fluid restrict to 1.5 L daily.  Low-sodium diet.  2 presumed nonischemic cardiomyopathy-previous MRI suggested nonischemic cardiomyopathy at [facility]. Chest CT 9/18 showed coronary calcification. Plan definitive evaluation today with right and left cardiac catheterization.  The risks and benefits including myocardial infarction, CVA and death discussed and she agrees to proceed. Hold diuretics precath; no Vgram; follow renal function after procedure. Continue low dose avapro; transition to entresto later if BP allows (borderline at present).  Beta-blockade on hold given acute CHF.  Add carvedilol as tolerated by blood pressure once she improves.  Note she has a left bundle branch block at baseline.  Would plan to repeat echocardiogram 3 months after medications titrated.  If ejection fraction less than 35% would need to consider CRT-D.  3 tobacco abuse-patient counseled on discontinuing.  4 history of noncompliance-patient previously educated on importance of compliance.  5 MR-moderate on most recent echo; hopefully would improve with medical therapy +- CRT in future.  6 Lung nodule on previous CT-FU study 9/19.  7 acute kidney disease-Cr mildly increased from diuresis; hold diuretics prior to cath and follow renal function after procedure.  For questions or updates, please contact [facility] Please consult [email or web address] for contact info under Cardiology/STEMI.      Signed, Olga Millers, MD  03/21/2017, 7:45 AM

## 2017-03-21 NOTE — Care Management Note (Signed)
Case Management Note  Patient Details  Name: Barbara Oliver MRN: [id number] Date of Birth: [birth year]  Subjective/Objective:                  Pt is s/p cath  Action/Plan:  PTA independent from home with family.  Pt has PCP and denied barriers to obtaining medications.  Pt states she weighs everyday and she adheres to low salt diet.  No CM needs identified at this time   Expected Discharge Date:                  Expected Discharge Plan:  Home/Self Care  In-House Referral:     Discharge planning Services  CM Consult  Post Acute Care Choice:    Choice offered to:     DME Arranged:    DME Agency:     HH Arranged:    HH Agency:     Status of Service:     If discussed at [employer], dates discussed:    Additional Comments:  Cherylann Parr, RN 03/21/2017, 4:08 PM

## 2017-03-22 ENCOUNTER — Encounter (HOSPITAL_COMMUNITY): Payer: Self-pay | Admitting: Internal Medicine

## 2017-03-22 LAB — BASIC METABOLIC PANEL
Anion gap: 10 (ref 5–15)
BUN: 35 mg/dL — AB (ref 6–20)
CALCIUM: 9.1 mg/dL (ref 8.9–10.3)
CO2: 21 mmol/L — ABNORMAL LOW (ref 22–32)
Chloride: 104 mmol/L (ref 101–111)
Creatinine, Ser: 1.33 mg/dL — ABNORMAL HIGH (ref 0.44–1.00)
GFR calc Af Amer: 47 mL/min — ABNORMAL LOW (ref >60)
GFR, EST NON AFRICAN AMERICAN: 41 mL/min — AB (ref >60)
GLUCOSE: 104 mg/dL — AB (ref 65–99)
Potassium: 4.3 mmol/L (ref 3.5–5.1)
Sodium: 135 mmol/L (ref 135–145)

## 2017-03-22 NOTE — Progress Notes (Signed)
Progress Note  Patient Name: Barbara Oliver Date of Encounter: 03/22/2017  Primary Cardiologist: New; Dr Jens Som  Subjective   Some DOE; no chest pain  Inpatient Medications    Scheduled Meds: . aspirin EC  81 mg Oral Daily  . enoxaparin (LOVENOX) injection  40 mg Subcutaneous Q24H  . fluticasone  1 spray Each Nare Daily  . GALACTIC Study - Placebo / omecamtiv mecarbil (PI-[facility])  1 tablet Oral BID  . irbesartan  75 mg Oral Daily  . pantoprazole  40 mg Oral Daily  . sodium chloride flush  3 mL Intravenous Q12H  . sodium chloride flush  3 mL Intravenous Q12H   Continuous Infusions: . sodium chloride    . sodium chloride     PRN Meds: sodium chloride, sodium chloride, acetaminophen, albuterol, ondansetron (ZOFRAN) IV, sodium chloride flush, sodium chloride flush, traMADol   Vital Signs    Vitals:   03/21/17 2351 03/22/17 0000 03/22/17 0100 03/22/17 0445  BP: 99/67 108/72 (!) 84/50 118/73  Pulse: 83 92 84 86  Resp: 19 20 17  (!) 25  Temp: 98.3 F (36.8 C)   98.1 F (36.7 C)  TempSrc: Oral   Oral  SpO2: 94% 97% 90% 92%  Weight:    146 lb 13.2 oz (66.6 kg)  Height:        Intake/Output Summary (Last 24 hours) at 03/22/2017 0824 Last data filed at 03/21/2017 2218 Gross per 24 hour  Intake 47.17 ml  Output -  Net 47.17 ml   Filed Weights   03/21/17 0500 03/21/17 1645 03/22/17 0445  Weight: 144 lb 6.4 oz (65.5 kg) 144 lb (65.3 kg) 146 lb 13.2 oz (66.6 kg)    Telemetry    Sinus with occasional PVC and isolated couplet; Personally Reviewed   Physical Exam   GEN: WD, NAD Neck: supple Cardiac: RRR Respiratory: CTA, no wheeze GI: Soft, NT/ND MS: No edema Neuro:  no focal findings   Labs    Chemistry Recent Labs  Lab 03/19/17 1902  03/20/17 0316 03/21/17 0228 03/21/17 1641 03/22/17 0228  NA 138  --  138 137  --  135  K 4.2  --  3.8 3.4*  --  4.3  CL 109  --  105 101  --  104  CO2 20*  --  23 24  --  21*  GLUCOSE 84  --  85 87  --  104*  BUN  21*  --  21* 26*  --  35*  CREATININE 1.09*   < > 1.07* 1.23* 1.28* 1.33*  CALCIUM 9.1  --  9.1 9.5  --  9.1  PROT 7.3  --   --   --   --   --   ALBUMIN 3.2*  --   --   --   --   --   AST 24  --   --   --   --   --   ALT 19  --   --   --   --   --   ALKPHOS 119  --   --   --   --   --   BILITOT 1.4*  --   --   --   --   --   GFRNONAA 52*   < > 53* 45* 43* 41*  GFRAA >60   < > >60 52* 50* 47*  ANIONGAP 9  --  10 12  --  10   < > = values in  this interval not displayed.     Hematology Recent Labs  Lab 03/19/17 2319 03/20/17 0316 03/21/17 1641  WBC 5.9 5.1 6.5  RBC 4.42 4.39 5.15*  HGB 12.7 12.4 14.9  HCT 40.1 39.5 46.3*  MCV 90.7 90.0 89.9  MCH 28.7 28.2 28.9  MCHC 31.7 31.4 32.2  RDW 15.5 15.5 15.2  PLT 303 359 393     Recent Labs  Lab 03/19/17 1923  TROPIPOC 0.02     BNP Recent Labs  Lab 03/19/17 1902  BNP 1,726.9*      Patient Profile     67 y.o. female with past medical history of presumed nonischemic cardiomyopathy admitted with acute on chronic systolic congestive heart failure.  Last echocardiogram September 2018 showed ejection fraction 20-25%, mild aortic insufficiency, moderate mitral regurgitation, mild biatrial enlargement and mild right ventricular enlargement.  Assessment & Plan    1 acute on chronic systolic congestive heart failure-symptoms are improving. PCWP 10 at time of RHC; BUN and Cr ratio increased today; will hold lasix this AM and resume lower dose in AM. Resume spironolactone in AM. Follow renal function. Fluid restrict to 1.5 L daily.  Low-sodium diet. Will likely need CRT. Enroll in [place] [facility].  2 nonischemic cardiomyopathy-cath reveals no CAD; previous MRI suggested nonischemic cardiomyopathy at [facility]. Continue low dose avapro; BP borderline; doubt she will tolerate entresto; will add low dose coreg in AM as BP allows.  Beta-blockade on hold given acute CHF.  As outlined previously she has a left bundle branch block at  baseline.  Would plan to repeat echocardiogram 3 months after medications titrated.  If ejection fraction less than 35% would need to consider CRT-D.  3 tobacco abuse-patient previously counseled on discontinuing.  4 history of noncompliance-patient previously educated on importance of compliance.  5 MR-moderate on most recent echo; hopefully would improve with medical therapy +- CRT in future.  6 Lung nodule on previous CT-FU study 9/19.  7 acute kidney disease-Pt appears to be prerenal this AM and PCWP 10 yesterday; hold diuretics today and resume in AM if pt stable.  For questions or updates, please contact [facility] Please consult [email or web address] for contact info under Cardiology/STEMI.      Signed, Olga Millers, MD  03/22/2017, 8:24 AM

## 2017-03-22 NOTE — Progress Notes (Signed)
Pt having soft BP. MD notified. Will continue to monitor.  Audie Box, RN

## 2017-03-23 LAB — BASIC METABOLIC PANEL
Anion gap: 8 (ref 5–15)
BUN: 27 mg/dL — ABNORMAL HIGH (ref 6–20)
CALCIUM: 9.4 mg/dL (ref 8.9–10.3)
CHLORIDE: 106 mmol/L (ref 101–111)
CO2: 20 mmol/L — ABNORMAL LOW (ref 22–32)
CREATININE: 1.04 mg/dL — AB (ref 0.44–1.00)
GFR calc non Af Amer: 55 mL/min — ABNORMAL LOW (ref >60)
Glucose, Bld: 100 mg/dL — ABNORMAL HIGH (ref 65–99)
Potassium: 4.2 mmol/L (ref 3.5–5.1)
Sodium: 134 mmol/L — ABNORMAL LOW (ref 135–145)

## 2017-03-23 MED ORDER — FUROSEMIDE 40 MG PO TABS
40.0000 mg | ORAL_TABLET | Freq: Every day | ORAL | Status: DC
  Administered 2017-03-23 – 2017-03-25 (×3): 40 mg via ORAL
  Filled 2017-03-23 (×3): qty 1

## 2017-03-23 MED ORDER — SPIRONOLACTONE 25 MG PO TABS
25.0000 mg | ORAL_TABLET | Freq: Every day | ORAL | Status: DC
  Administered 2017-03-23 – 2017-03-25 (×3): 25 mg via ORAL
  Filled 2017-03-23 (×3): qty 1

## 2017-03-23 NOTE — Progress Notes (Signed)
Progress Note  Patient Name: Barbara Oliver Date of Encounter: 03/23/2017  Primary Cardiologist: New; Dr Jens Som  Subjective   Mild DOE; no chest pain  Inpatient Medications    Scheduled Meds: . aspirin EC  81 mg Oral Daily  . enoxaparin (LOVENOX) injection  40 mg Subcutaneous Q24H  . fluticasone  1 spray Each Nare Daily  . GALACTIC Study - Placebo / omecamtiv mecarbil (PI-[facility])  1 tablet Oral BID  . irbesartan  75 mg Oral Daily  . pantoprazole  40 mg Oral Daily  . sodium chloride flush  3 mL Intravenous Q12H  . sodium chloride flush  3 mL Intravenous Q12H   Continuous Infusions: . sodium chloride    . sodium chloride     PRN Meds: sodium chloride, sodium chloride, acetaminophen, albuterol, ondansetron (ZOFRAN) IV, sodium chloride flush, sodium chloride flush, traMADol   Vital Signs    Vitals:   03/22/17 2356 03/23/17 0341 03/23/17 0640 03/23/17 0730  BP: 100/64 129/63  106/76  Pulse: 81 82  70  Resp: 17 (!) 22  16  Temp: 98.3 F (36.8 C) 97.6 F (36.4 C)  (!) 97.4 F (36.3 C)  TempSrc: Oral Oral  Oral  SpO2: 97% 100%  90%  Weight:   149 lb 4 oz (67.7 kg)   Height:        Intake/Output Summary (Last 24 hours) at 03/23/2017 0852 Last data filed at 03/23/2017 0342 Gross per 24 hour  Intake 600 ml  Output 1625 ml  Net -1025 ml   Filed Weights   03/21/17 1645 03/22/17 0445 03/23/17 0640  Weight: 144 lb (65.3 kg) 146 lb 13.2 oz (66.6 kg) 149 lb 4 oz (67.7 kg)    Telemetry    Sinus with occasional PVC and 6 beats NSVT; Personally Reviewed   Physical Exam   GEN: WD/WN, NAD Neck: supple; no JVD Cardiac: RRR, no murmur Respiratory: CTA, no rhonchi GI: Soft, NT/ND, no masses MS: no edema Neuro:  grossly intact   Labs    Chemistry Recent Labs  Lab 03/19/17 1902  03/21/17 0228 03/21/17 1641 03/22/17 0228 03/23/17 0241  NA 138   < > 137  --  135 134*  K 4.2   < > 3.4*  --  4.3 4.2  CL 109   < > 101  --  104 106  CO2 20*   < > 24  --  21*  20*  GLUCOSE 84   < > 87  --  104* 100*  BUN 21*   < > 26*  --  35* 27*  CREATININE 1.09*   < > 1.23* 1.28* 1.33* 1.04*  CALCIUM 9.1   < > 9.5  --  9.1 9.4  PROT 7.3  --   --   --   --   --   ALBUMIN 3.2*  --   --   --   --   --   AST 24  --   --   --   --   --   ALT 19  --   --   --   --   --   ALKPHOS 119  --   --   --   --   --   BILITOT 1.4*  --   --   --   --   --   GFRNONAA 52*   < > 45* 43* 41* 55*  GFRAA >60   < > 52* 50* 47* >60  ANIONGAP  9   < > 12  --  10 8   < > = values in this interval not displayed.     Hematology Recent Labs  Lab 03/19/17 2319 03/20/17 0316 03/21/17 1641  WBC 5.9 5.1 6.5  RBC 4.42 4.39 5.15*  HGB 12.7 12.4 14.9  HCT 40.1 39.5 46.3*  MCV 90.7 90.0 89.9  MCH 28.7 28.2 28.9  MCHC 31.7 31.4 32.2  RDW 15.5 15.5 15.2  PLT 303 359 393     Recent Labs  Lab 03/19/17 1923  TROPIPOC 0.02     BNP Recent Labs  Lab 03/19/17 1902  BNP 1,726.9*      Patient Profile     67 y.o. female with past medical history of presumed nonischemic cardiomyopathy admitted with acute on chronic systolic congestive heart failure.  Last echocardiogram September 2018 showed ejection fraction 20-25%, mild aortic insufficiency, moderate mitral regurgitation, mild biatrial enlargement and mild right ventricular enlargement.  Assessment & Plan    1 acute on chronic systolic congestive heart failure-symptoms continuing to improve. PCWP 10 at time of RHC; renal function better this AM; resume lasix 40 mg daily and spironolactone 25 mg dialy; repeat BMET in AM. Fluid restrict to 1.5 L daily.  Low-sodium diet. Will likely need CRT in the future. Enrolled in [employer] [facility].  2 nonischemic cardiomyopathy-cath reveals no CAD; previous MRI suggested nonischemic cardiomyopathy at [facility]. Continue low dose avapro; BP borderline; doubt she will tolerate entresto; will add low dose coreg in AM if BP allows. Med titration will be limited by BP. As outlined previously she  has a left bundle branch block at baseline.  Would plan to repeat echocardiogram 3 months after medications titrated.  If ejection fraction less than 35% would need to consider CRT-D.  3 tobacco abuse-patient previously counseled on discontinuing.  4 history of noncompliance-patient previously educated on importance of compliance.  5 MR-moderate on most recent echo; hopefully would improve with medical therapy +- CRT in future.  6 Lung nodule on previous CT-FU study 9/19.  7 acute kidney disease-renal function improved; resume diuretics as outlined and follow.  Possible DC in AM if stable.  For questions or updates, please contact [facility] Please consult [email or web address] for contact info under Cardiology/STEMI.      Signed, Olga Millers, MD  03/23/2017, 8:52 AM

## 2017-03-24 ENCOUNTER — Encounter (HOSPITAL_COMMUNITY): Payer: Self-pay | Admitting: Physician Assistant

## 2017-03-24 DIAGNOSIS — R911 Solitary pulmonary nodule: Secondary | ICD-10-CM | POA: Diagnosis present

## 2017-03-24 DIAGNOSIS — N183 Chronic kidney disease, stage 3 unspecified: Secondary | ICD-10-CM | POA: Diagnosis present

## 2017-03-24 DIAGNOSIS — Z9119 Patient's noncompliance with other medical treatment and regimen: Secondary | ICD-10-CM

## 2017-03-24 DIAGNOSIS — I428 Other cardiomyopathies: Secondary | ICD-10-CM

## 2017-03-24 DIAGNOSIS — Z720 Tobacco use: Secondary | ICD-10-CM | POA: Diagnosis present

## 2017-03-24 DIAGNOSIS — Z91199 Patient's noncompliance with other medical treatment and regimen due to unspecified reason: Secondary | ICD-10-CM

## 2017-03-24 DIAGNOSIS — I447 Left bundle-branch block, unspecified: Secondary | ICD-10-CM | POA: Diagnosis present

## 2017-03-24 DIAGNOSIS — I340 Nonrheumatic mitral (valve) insufficiency: Secondary | ICD-10-CM | POA: Diagnosis present

## 2017-03-24 LAB — BASIC METABOLIC PANEL
ANION GAP: 10 (ref 5–15)
BUN: 22 mg/dL — ABNORMAL HIGH (ref 6–20)
CALCIUM: 9.6 mg/dL (ref 8.9–10.3)
CO2: 22 mmol/L (ref 22–32)
CREATININE: 1.14 mg/dL — AB (ref 0.44–1.00)
Chloride: 103 mmol/L (ref 101–111)
GFR calc non Af Amer: 49 mL/min — ABNORMAL LOW (ref >60)
GFR, EST AFRICAN AMERICAN: 57 mL/min — AB (ref >60)
Glucose, Bld: 98 mg/dL (ref 65–99)
Potassium: 4.3 mmol/L (ref 3.5–5.1)
SODIUM: 135 mmol/L (ref 135–145)

## 2017-03-24 MED ORDER — CARVEDILOL 3.125 MG PO TABS
3.1250 mg | ORAL_TABLET | Freq: Two times a day (BID) | ORAL | Status: DC
  Administered 2017-03-24 – 2017-03-25 (×3): 3.125 mg via ORAL
  Filled 2017-03-24 (×3): qty 1

## 2017-03-24 MED ORDER — LIVING BETTER WITH HEART FAILURE BOOK
Freq: Once | Status: DC
  Filled 2017-03-24: qty 1

## 2017-03-24 NOTE — Progress Notes (Signed)
Progress Note  Patient Name: Barbara Oliver Date of Encounter: 03/24/2017  Primary Cardiologist: New; Dr Jens Som  Subjective   Fluttering in chest this am Has not been OOB   Inpatient Medications    Scheduled Meds: . aspirin EC  81 mg Oral Daily  . enoxaparin (LOVENOX) injection  40 mg Subcutaneous Q24H  . fluticasone  1 spray Each Nare Daily  . furosemide  40 mg Oral Daily  . GALACTIC Study - Placebo / omecamtiv mecarbil (PI-[facility])  1 tablet Oral BID  . irbesartan  75 mg Oral Daily  . pantoprazole  40 mg Oral Daily  . sodium chloride flush  3 mL Intravenous Q12H  . sodium chloride flush  3 mL Intravenous Q12H  . spironolactone  25 mg Oral Daily   Continuous Infusions: . sodium chloride    . sodium chloride     PRN Meds: sodium chloride, sodium chloride, acetaminophen, albuterol, ondansetron (ZOFRAN) IV, sodium chloride flush, sodium chloride flush, traMADol   Vital Signs    Vitals:   03/23/17 2205 03/23/17 2345 03/24/17 0325 03/24/17 0733  BP: 99/61 102/63 (!) 152/83 108/72  Pulse:    75  Resp: 16 20 (!) 23 (!) 21  Temp: 98.3 F (36.8 C)  98.3 F (36.8 C) 97.9 F (36.6 C)  TempSrc: Oral  Oral Oral  SpO2:    99%  Weight:   151 lb 7.3 oz (68.7 kg)   Height:        Intake/Output Summary (Last 24 hours) at 03/24/2017 0744 Last data filed at 03/24/2017 0735 Gross per 24 hour  Intake 840 ml  Output 3100 ml  Net -2260 ml   Filed Weights   03/22/17 0445 03/23/17 0640 03/24/17 0325  Weight: 146 lb 13.2 oz (66.6 kg) 149 lb 4 oz (67.7 kg) 151 lb 7.3 oz (68.7 kg)    Telemetry    Sinus with occasional PVC and 6 beats NSVT; Personally Reviewed   Physical Exam   Affect appropriate Chronically ill black female  HEENT: normal Neck supple with no adenopathy JVP normal no bruits no thyromegaly Lungs clear with no wheezing and good diaphragmatic motion Heart:  S1/S2 MR  murmur, no rub, gallop or click PMI increased  Abdomen: benighn, BS positve, no  tenderness, no AAA no bruit.  No HSM or HJR Distal pulses intact with no bruits No edema Neuro non-focal Skin warm and dry No muscular weakness    Labs    Chemistry Recent Labs  Lab 03/19/17 1902  03/22/17 0228 03/23/17 0241 03/24/17 0310  NA 138   < > 135 134* 135  K 4.2   < > 4.3 4.2 4.3  CL 109   < > 104 106 103  CO2 20*   < > 21* 20* 22  GLUCOSE 84   < > 104* 100* 98  BUN 21*   < > 35* 27* 22*  CREATININE 1.09*   < > 1.33* 1.04* 1.14*  CALCIUM 9.1   < > 9.1 9.4 9.6  PROT 7.3  --   --   --   --   ALBUMIN 3.2*  --   --   --   --   AST 24  --   --   --   --   ALT 19  --   --   --   --   ALKPHOS 119  --   --   --   --   BILITOT 1.4*  --   --   --   --  GFRNONAA 52*   < > 41* 55* 49*  GFRAA >60   < > 47* >60 57*  ANIONGAP 9   < > 10 8 10    < > = values in this interval not displayed.     Hematology Recent Labs  Lab 03/19/17 2319 03/20/17 0316 03/21/17 1641  WBC 5.9 5.1 6.5  RBC 4.42 4.39 5.15*  HGB 12.7 12.4 14.9  HCT 40.1 39.5 46.3*  MCV 90.7 90.0 89.9  MCH 28.7 28.2 28.9  MCHC 31.7 31.4 32.2  RDW 15.5 15.5 15.2  PLT 303 359 393     Recent Labs  Lab 03/19/17 1923  TROPIPOC 0.02     BNP Recent Labs  Lab 03/19/17 1902  BNP 1,726.9*      Patient Profile     67 y.o. female with past medical history of presumed nonischemic cardiomyopathy admitted with acute on chronic systolic congestive heart failure.  Last echocardiogram September 2018 showed ejection fraction 20-25%, mild aortic insufficiency, moderate mitral regurgitation, mild biatrial enlargement and mild right ventricular enlargement.  Assessment & Plan    1 acute on chronic systolic congestive heart failure-contiinue aldactone and lasix improved BMET this am is fine . Enrolled in [employer] [facility].  2 nonischemic cardiomyopathy-cath reveals no CAD; add low dose coreg f/u echo 3 months and consider CRT given wide LBBB  3 tobacco abuse-patient previously counseled on discontinuing.  4  history of noncompliance-patient previously educated on importance of compliance.  5 MR-moderate on most recent echo; hopefully would improve with medical therapy +- CRT in future.  6 Lung nodule on previous CT-FU study 9/19.  7 acute kidney disease-renal function improved; resume diuretics as outlined and follow.  Possible DC in AM if stable.   For questions or updates, please contact [facility] Please consult [email or web address] for contact info under Cardiology/STEMI.      Signed, Charlton Haws, MD  03/24/2017, 7:44 AM

## 2017-03-24 NOTE — Discharge Summary (Addendum)
Discharge Summary    Patient ID: Barbara Oliver,  MRN: [id number], DOB/AGE: 66-04-1950 66 y.o.  Admit date: 03/19/2017 Discharge date: 03/25/2017  Primary Care Provider: Patient, No Pcp Per Primary Cardiologist: Previously [facility], new to Dr. Jens Som this admission  Discharge Diagnoses    Principal Problem:   Acute on chronic systolic (congestive) heart failure (HCC) Active Problems:   History of noncompliance with medical treatment, presenting hazards to health   CKD (chronic kidney disease), stage III (HCC)   LBBB (left bundle branch block)   NICM (nonischemic cardiomyopathy) (HCC)   Mitral regurgitation   Tobacco abuse   Lung nodule    Diagnostic Studies/Procedures    Procedures Apr 12, 2017  RIGHT/LEFT HEART CATH AND CORONARY ANGIOGRAPHY  Conclusion   Findings:  Ao = 125/72 (93) LV = 116/5 RA = 2 RV = 32/1 PA = 30/9 (18) PCW = 10 Fick cardiac output/index = 4.6/2.5 PVR = 1.7 WU SVR = 1536 FA sat =  90% PA sat = 57%, 58%  Assessment: 1. Normal coronary arteries 2. NICM EF 30-35% 3. Well-compensated filling pressures after diuresis  Plan/Discussion:  Etiology of NICM unclear. Consider cMRI. Continue medical therapy with titration of HF meds.   Arvilla Meres, MD  2:32 PM      _____________     History of Present Illness     Barbara Oliver is a 67 y.o. female with history of NICM (EF 20-25% in 11/2016, 30-35% this admission), miltral regurgitation, probable CKD stage III, acid reflux, LBBB, tobacco abuse who presented to [facility] with worsening dyspnea and abdominal distention. The patient has a known diagnosis of heart failure with reduced ejection fraction for quite some time, however due to difficulties with insurance she has been incompletely compliant with her medical regimen and has not had routine outpatient follow-up.  She had a noninvasive ischemic evaluation at [facility] in 2017 with a stress MRI, which showed no evidence of reversible ischemia. Last  echo 11/2016 showed EF 20-25%, grade 1 diastolic dysfunction, mild AI, moderate MR, mildly dilated RV. Over the last 2-3 months she feels her condition has deteriorated, and she is markedly dyspneic even with minimal activity, including brushing her teeth.  She has lost a considerable amount of weight over the past year, probably about 30-40 pounds, although she notes increasing swelling in her abdomen and lower extremities.  She is also had significant PND and orthopnea.  As the patient just turned 38, she was getting connected with a primary care physician, who ordered a baseline echo.  Upon reporting to the echo lab today she was noted to be significantly dyspneic.  They completed the echo (results unavailable), but referred her to the [facility] emergency department for further evaluation. In the ED she was found to be tachypneic and CXR showed mild pulmonary edema.  She was admitted to the cardiology service for further management.  Hospital Course    1. Acute on chronic systolic congestive heart failure - initial exam showed concern for peripheral vasoconstricted so she was started on IV Lasix, but beta blocker was held in the acute phase. ARB was added (with lower BP it was felt doubtful that she would tolerate Entresto). As chest CT 9/18 showed coronary calcification, it was felt she would require [facility] for definitive evaluation. She underwent this on Apr 12, 2017 confirming normal coronary arteries, well-compensated filling pressures after diuresis, and EF 30-35%. Dr. Gala Romney recommended to consider cardiac MRI (could be decided as outpatient). Post cath she did appear to have pre-renal acute  kidney marker rise requiring holding of diuretics. On 1/13 this had improved so she was started back on Lasix and spironolactone. She was enrolled in the [place] HF trial. Beta blocker was resumed 1/14 which she tolerated well. It was felt she would need repeat arrangement of echo after 3 months med optimization, with  consideration for CRT-D given her wide LBBB. She diuresed 7.98 L to a weight of 151lb (158 on admission, variable in between but 151 the last 2 days). Research team dispensed the HF study medication and arranged f/u 1/28. Pt also given HF booklet for review.  2. Tobacco abuse - patient counseled on discontinuing.  3. History of noncompliance - patient educated on importance of compliance. Will need close follow-up.  4. Mitral regurgitation - moderate on most recent echo; hopefully would improve with medical therapy +- CRT in future.  5 Lung nodule on previous CT - noted on 11/2016 CT. Imaging recommended surveillance CT 18-24 months after 02/2016 (so 2019). Will need attention to this upon follow-up.  6. Acute kidney injury versus CKD stage III - as above, s/p diuresis, improved with brief holding of meds. Cr peak was 1.33. Discharge creatinine is 1.14. Historical creatinines appear to range from 1-1.48 so she likely has CKD at baseline. She was advised to avoid NSAIDS.  The patient feels well today. Dr. Eden Emms has seen and examined the patient today and feels she is stable for discharge. Have arranged TOC f/u for 1/23. _____________  Discharge Vitals Blood pressure (!) 102/55, pulse 79, temperature 97.8 F (36.6 C), temperature source Oral, resp. rate 17, height 6\' 1"  (1.854 m), weight 151 lb 7.3 oz (68.7 kg), SpO2 99 %.  Filed Weights   03/23/17 0640 03/24/17 0325 03/25/17 0543  Weight: 149 lb 4 oz (67.7 kg) 151 lb 7.3 oz (68.7 kg) 151 lb 7.3 oz (68.7 kg)    Labs & Radiologic Studies    Basic Metabolic Panel Recent Labs    16/10/96 0241 03/24/17 0310  NA 134* 135  K 4.2 4.3  CL 106 103  CO2 20* 22  GLUCOSE 100* 98  BUN 27* 22*  CREATININE 1.04* 1.14*  CALCIUM 9.4 9.6  _____________  Dg Chest 2 View  Result Date: 03/19/2017 CLINICAL DATA:  67 year old female with history of severe shortness of breath for the past 3 days, worse on exertion. Bilateral lower extremity swelling  over the past several days. History of congestive heart failure and mitral regurgitation. EXAM: CHEST  2 VIEW COMPARISON:  Chest x-ray 01/28/2017. FINDINGS: Mild diffuse peribronchial cuffing and interstitial prominence. Mild cephalization of the pulmonary vasculature. No acute consolidative airspace disease. No pleural effusions. However, there thickening of the major and minor fissures. Moderate cardiomegaly. Upper mediastinal contours are within normal limits. Aortic atherosclerosis. IMPRESSION: 1. The appearance the chest suggests mild congestive heart failure. 2. Aortic atherosclerosis. Electronically Signed   By: Trudie Reed M.D.   On: 03/19/2017 19:55   Disposition   Pt is being discharged home today in good condition.  Follow-up Plans & Appointments    Follow-up Information    Jodelle Gross, NP Follow up.   Specialties:  Nurse Practitioner, Radiology, Cardiology Why:  [facility] office - 04/02/17 at 9:30am. Arrive by 9:15am to check in and park. Samara Deist is one of the nurse practitioners that works closely with Dr. Alphonsa Gin and the heart group. Contact information: [address] [place] [address] [phone]          Discharge Instructions  Diet - low sodium heart healthy   Complete by:  As directed    Discharge instructions   Complete by:  As directed    You have had multiple medication changes.  Your furosemide (Lasix), carvedilol, and spironolactone doses have all changed and a new prescription was sent in for these. New medicines include irbesartan and your [employer] study medication. Your azithromycin and nicotine patches were removed from the list as you indicated you were not taking these at home. You can start nicotine patches back at home if needed, as directed on box.  Your kidney function appears to be chronically abnormal to a mild degree. Patients with kidney issues should stay away from medicines like ibuprofen, Advil,  Motrin, naproxen, and Aleve due to risk of worsening kidney function. Therefore, we removed ibuprofen from your medicine list. Tylenol (as directed) is a safer alternative.   Increase activity slowly   Complete by:  As directed    No lifting over 5 lbs for 5 days. No sexual activity for 5 days. Keep procedure site clean & dry. If you notice increased pain, swelling, bleeding or pus, call/return!  You may shower, but no soaking baths/pools for 1 week. You should avoid hot tubs with your cardiac condition.      Discharge Medications   Allergies as of 03/25/2017   No Known Allergies     Medication List    STOP taking these medications   azithromycin 250 MG tablet Commonly known as:  ZITHROMAX   ibuprofen 200 MG tablet Commonly known as:  ADVIL,MOTRIN   nicotine 7 mg/24hr patch Commonly known as:  NICODERM CQ - dosed in mg/24 hr     TAKE these medications   acetaminophen 500 MG tablet Commonly known as:  TYLENOL Take 1,000 mg by mouth daily as needed for mild pain.   albuterol 108 (90 Base) MCG/ACT inhaler Commonly known as:  PROVENTIL HFA;VENTOLIN HFA Inhale 1-2 puffs into the lungs every 6 (six) hours as needed for wheezing or shortness of breath.   aspirin 81 MG EC tablet Take 1 tablet (81 mg total) by mouth daily.   carvedilol 3.125 MG tablet Commonly known as:  COREG Take 1 tablet (3.125 mg total) by mouth 2 (two) times daily with a meal. What changed:    medication strength  how much to take  when to take this   fluticasone 50 MCG/ACT nasal spray Commonly known as:  FLONASE Place 1 spray into both nostrils daily.   furosemide 40 MG tablet Commonly known as:  LASIX Take 1 tablet (40 mg total) by mouth daily.   Investigational - Study Medication Study name: GALACTIC Study - Placebo / omecamtiv mecarbil tablet Take 1 tablet by mouth twice daily   irbesartan 75 MG tablet Commonly known as:  AVAPRO Take 1 tablet (75 mg total) by mouth daily.   pantoprazole  40 MG tablet Commonly known as:  PROTONIX Take 1 tablet (40 mg total) by mouth daily.   spironolactone 25 MG tablet Commonly known as:  ALDACTONE Take 1 tablet (25 mg total) by mouth daily. What changed:  how much to take   traMADol 50 MG tablet Commonly known as:  ULTRAM Take 1 tablet (50 mg total) by mouth every 6 (six) hours as needed.        Allergies:  No Known Allergies    Outstanding Labs/Studies   None  Duration of Discharge Encounter   Greater than 30 minutes including physician time.  Signed, Laurann Montana PA-C  03/25/2017, 8:19 AM

## 2017-03-24 NOTE — Plan of Care (Signed)
Continue current care plan 

## 2017-03-25 MED ORDER — SPIRONOLACTONE 25 MG PO TABS: 25.0000 mg | ORAL_TABLET | Freq: Every day | ORAL | 2 refills | Status: DC

## 2017-03-25 MED ORDER — CARVEDILOL 3.125 MG PO TABS: 3.1250 mg | ORAL_TABLET | Freq: Two times a day (BID) | ORAL | 2 refills | Status: DC

## 2017-03-25 MED ORDER — STUDY - INVESTIGATIONAL MEDICATION: 99 refills | Status: DC

## 2017-03-25 MED ORDER — FUROSEMIDE 40 MG PO TABS: 40.0000 mg | ORAL_TABLET | Freq: Every day | ORAL | 2 refills | Status: DC

## 2017-03-25 MED ORDER — IRBESARTAN 75 MG PO TABS: 75.0000 mg | ORAL_TABLET | Freq: Every day | ORAL | 2 refills | Status: DC

## 2017-03-25 NOTE — Progress Notes (Signed)
Pt discharged per MD order, all discharge orders and instructions reviewed.

## 2017-03-25 NOTE — Progress Notes (Signed)
Progress Note  Patient Name: Barbara Oliver Date of Encounter: 03/25/2017  Primary Cardiologist: New; Dr Jens Som  Subjective   No fluttering in chest no dyspnea or chest pain wants to shower   Inpatient Medications    Scheduled Meds: . aspirin EC  81 mg Oral Daily  . carvedilol  3.125 mg Oral BID WC  . enoxaparin (LOVENOX) injection  40 mg Subcutaneous Q24H  . fluticasone  1 spray Each Nare Daily  . furosemide  40 mg Oral Daily  . GALACTIC Study - Placebo / omecamtiv mecarbil ([place])  1 tablet Oral BID  . irbesartan  75 mg Oral Daily  . Living Better with Heart Failure Book   Does not apply Once  . pantoprazole  40 mg Oral Daily  . sodium chloride flush  3 mL Intravenous Q12H  . sodium chloride flush  3 mL Intravenous Q12H  . spironolactone  25 mg Oral Daily   Continuous Infusions: . sodium chloride    . sodium chloride     PRN Meds: sodium chloride, sodium chloride, acetaminophen, albuterol, ondansetron (ZOFRAN) IV, sodium chloride flush, sodium chloride flush, traMADol   Vital Signs    Vitals:   03/24/17 1956 03/24/17 2313 03/25/17 0435 03/25/17 0543  BP: 108/60 (!) 93/59 (!) 102/55   Pulse: 80 78 79   Resp: 15 18 17    Temp: 98.2 F (36.8 C) 98.1 F (36.7 C) 97.8 F (36.6 C)   TempSrc: Oral Oral Oral   SpO2: 97% 94% 99%   Weight:    151 lb 7.3 oz (68.7 kg)  Height:        Intake/Output Summary (Last 24 hours) at 03/25/2017 0744 Last data filed at 03/25/2017 0433 Gross per 24 hour  Intake 1883 ml  Output 2500 ml  Net -617 ml   Filed Weights   03/23/17 0640 03/24/17 0325 03/25/17 0543  Weight: 149 lb 4 oz (67.7 kg) 151 lb 7.3 oz (68.7 kg) 151 lb 7.3 oz (68.7 kg)    Telemetry    NSR 03/25/2017    Physical Exam   Affect appropriate Thin black female  HEENT: normal Neck supple with no adenopathy JVP normal no bruits no thyromegaly Lungs clear with no wheezing and good diaphragmatic motion Heart:  S1/S2 MR  murmur, no rub, gallop or  click PMI normal Abdomen: benighn, BS positve, no tenderness, no AAA no bruit.  No HSM or HJR Distal pulses intact with no bruits No edema Neuro non-focal Skin warm and dry No muscular weakness    Labs    Chemistry Recent Labs  Lab 03/19/17 1902  03/22/17 0228 03/23/17 0241 03/24/17 0310  NA 138   < > 135 134* 135  K 4.2   < > 4.3 4.2 4.3  CL 109   < > 104 106 103  CO2 20*   < > 21* 20* 22  GLUCOSE 84   < > 104* 100* 98  BUN 21*   < > 35* 27* 22*  CREATININE 1.09*   < > 1.33* 1.04* 1.14*  CALCIUM 9.1   < > 9.1 9.4 9.6  PROT 7.3  --   --   --   --   ALBUMIN 3.2*  --   --   --   --   AST 24  --   --   --   --   ALT 19  --   --   --   --   ALKPHOS 119  --   --   --   --  BILITOT 1.4*  --   --   --   --   GFRNONAA 52*   < > 41* 55* 49*  GFRAA >60   < > 47* >60 57*  ANIONGAP 9   < > 10 8 10    < > = values in this interval not displayed.     Hematology Recent Labs  Lab 03/19/17 2319 03/20/17 0316 03/21/17 1641  WBC 5.9 5.1 6.5  RBC 4.42 4.39 5.15*  HGB 12.7 12.4 14.9  HCT 40.1 39.5 46.3*  MCV 90.7 90.0 89.9  MCH 28.7 28.2 28.9  MCHC 31.7 31.4 32.2  RDW 15.5 15.5 15.2  PLT 303 359 393     Recent Labs  Lab 03/19/17 1923  TROPIPOC 0.02     BNP Recent Labs  Lab 03/19/17 1902  BNP 1,726.9*      Patient Profile     67 y.o. female with past medical history of presumed nonischemic cardiomyopathy admitted with acute on chronic systolic congestive heart failure.  Last echocardiogram September 2018 showed ejection fraction 20-25%, mild aortic insufficiency, moderate mitral regurgitation, mild biatrial enlargement and mild right ventricular enlargement.  Assessment & Plan    1 acute on chronic systolic congestive heart failure-resolved improved ready for d/c . Enrolled in [employer] HF.  2 nonischemic cardiomyopathy-cath reveals no CAD; MRI/Echo 3 months consider CRT with LBBB   3 tobacco abuse-cessation discussed CXR ok   4 history of  noncompliance-patient previously educated on importance of compliance.  5 MR-moderate on most recent echo; hopefully would improve with medical therapy +- CRT in future.  6 Lung nodule on previous CT-FU study 9/19.  7 acute kidney disease-renal function improved; resume diuretics as outlined and follow.  DC home f/u [facility]   For questions or updates, please contact [facility] Please consult [email or web address] for contact info under Cardiology/STEMI.      Signed, Charlton Haws, MD  03/25/2017, 7:44 AM

## 2017-03-25 NOTE — Progress Notes (Signed)
Pt requested meds be diverted to [facility]. I did such and called [facility] to cancel rx on file. Avraj Lindroth PA-C

## 2017-03-26 ENCOUNTER — Telehealth: Payer: Self-pay | Admitting: Physician Assistant

## 2017-03-26 DIAGNOSIS — R945 Abnormal results of liver function studies: Secondary | ICD-10-CM | POA: Diagnosis not present

## 2017-03-26 DIAGNOSIS — I509 Heart failure, unspecified: Secondary | ICD-10-CM | POA: Diagnosis not present

## 2017-03-26 DIAGNOSIS — R6889 Other general symptoms and signs: Secondary | ICD-10-CM | POA: Diagnosis not present

## 2017-03-26 DIAGNOSIS — K219 Gastro-esophageal reflux disease without esophagitis: Secondary | ICD-10-CM | POA: Diagnosis not present

## 2017-04-01 ENCOUNTER — Encounter: Payer: Self-pay | Admitting: Adult Health

## 2017-04-01 NOTE — Progress Notes (Signed)
Cardiology Office Note   Date:  04/02/2017   ID:  [name], [name] September 08, 1950, MRN [id number]  PCP:  Virl Axe, FNP    Cardiologist: [facility]  Chief Complaint  Patient presents with  . Follow-up  . Congestive Heart Failure     History of Present Illness: Barbara Oliver is a 67 y.o. female who presents for posthospitalization follow-up after admission for acute on chronic systolic heart failure, with a history of chronic left bundle branch block, nonischemic cardiiomyopathy, mitral regurgitation, with other history to include history of noncompliance with medical treatment, ongoing tobacco abuse, and a lung nodule.  She has had issues with health insurance and is not been able to afford all of her medications or come to routine office appointments.  During hospitalization the patient had a right and left heart catheterization with coronary angiography on 03/21/2017.  This revealed normal coronary arteries with nonischemic cardiomyopathy with an EF of 30% to 35% with well compensated filling pressures after diuresis.  In the past she has had an stress MRI at [facility] which showed no evidence of reversible ischemia in December 2017.  She gained approximately 30-40 pounds.  Patient was given IV diuresis, ARB was added with consideration to add Entresto as outpatient if blood pressure was stable.  Post catheterization she did have some acute renal marker rise which required holding of diuretics temporarily.  She was also started on spironolactone once diuretics were reinstated.  Prior to hospitalization beta-blocker was added.  Total diuresis during hospitalization 7.98 L with a discharge weight of 151 pounds.  Follow-up echocardiogram is planned after medical therapy.  With consideration of CRT in the future.  The patient is now part of heart failure research study, and is due to follow-up with Advanced Heart Failure clinic on April 07, 2017.  She was given counseling on tobacco  cessation.  Medication compliance was reinforced.  She is here today feeling great.  She weighs herself daily, she has not been gaining any weight and is staying with in 2-3 pounds of her weight on discharge.  She is medically compliant.  She denies near syncope lower extremity edema dyspnea on exertion or weakness.  She is adhering to a low-sodium diet.  Past Medical History:  Diagnosis Date  . Acid reflux   . Chronic systolic CHF (congestive heart failure) (HCC)   . CKD (chronic kidney disease), stage III (HCC)   . History of noncompliance with medical treatment, presenting hazards to health   . LBBB (left bundle branch block)   . Lung nodule    a. seen on prior CT, f/u due 11/2017.  . Mitral regurgitation   . NICM (nonischemic cardiomyopathy) (HCC)    a. prior hx of this, EF 20-25% in 11/2016. b. EF 30-35% by cath 03/2017 with normal coronaries.  . Tobacco abuse     Past Surgical History:  Procedure Laterality Date  . HERNIA REPAIR    . RIGHT/LEFT HEART CATH AND CORONARY ANGIOGRAPHY N/A 03/21/2017   Procedure: RIGHT/LEFT HEART CATH AND CORONARY ANGIOGRAPHY;  Surgeon: Dolores Patty, MD;  [facility];  Service: Cardiovascular;  Laterality: N/A;     Current Outpatient Medications  Medication Sig Dispense Refill  . acetaminophen (TYLENOL) 500 MG tablet Take 1,000 mg by mouth daily as needed for mild pain.    Marland Kitchen aspirin EC 81 MG EC tablet Take 1 tablet (81 mg total) by mouth daily.    . carvedilol (COREG) 3.125 MG tablet Take 1 tablet (3.125  mg total) by mouth 2 (two) times daily with a meal. 60 tablet 2  . fluticasone (FLONASE) 50 MCG/ACT nasal spray Place 1 spray into both nostrils daily. 16 g 0  . furosemide (LASIX) 40 MG tablet Take 1 tablet (40 mg total) by mouth daily. 30 tablet 2  . Investigational - Study Medication Study name: GALACTIC Study - Placebo / omecamtiv mecarbil tablet Take 1 tablet by mouth twice daily 1 each PRN  . pantoprazole (PROTONIX) 40 MG  tablet Take 1 tablet (40 mg total) by mouth daily. 30 tablet 0  . albuterol (PROVENTIL HFA;VENTOLIN HFA) 108 (90 Base) MCG/ACT inhaler Inhale 1-2 puffs into the lungs every 6 (six) hours as needed for wheezing or shortness of breath. (Patient not taking: Reported on 04/02/2017) 1 Inhaler 0  . irbesartan (AVAPRO) 75 MG tablet Take 1 tablet (75 mg total) by mouth daily. (Patient not taking: Reported on 04/02/2017) 30 tablet 2  . spironolactone (ALDACTONE) 25 MG tablet Take 1 tablet (25 mg total) by mouth daily. (Patient not taking: Reported on 04/02/2017) 30 tablet 2  . traMADol (ULTRAM) 50 MG tablet Take 1 tablet (50 mg total) by mouth every 6 (six) hours as needed. (Patient not taking: Reported on 04/02/2017) 6 tablet 0   No current facility-administered medications for this visit.     Allergies:   Patient has no known allergies.    Social History:  The patient  reports that she has been smoking cigarettes.  She has been smoking about 0.00 packs per day. she has never used smokeless tobacco. She reports that she does not drink alcohol or use drugs.   Family History:  The patient's family history includes Heart attack in her father; Hypertension in her mother.    ROS: All other systems are reviewed and negative. Unless otherwise mentioned in H&P    PHYSICAL EXAM: VS:  BP 102/62   Pulse 86   Ht 6\' 1"  (1.854 m)   Wt 158 lb 9.6 oz (71.9 kg)   BMI 20.92 kg/m  , BMI Body mass index is 20.92 kg/m. GEN: Well nourished, well developed, in no acute distress  HEENT: normal  Neck: no JVD, carotid bruits, or masses Cardiac: RRR; split S1, with S3 murmur  rubs, or gallops,no edema  Respiratory:  clear to auscultation bilaterally, normal work of breathing GI: soft, nontender, nondistended, + BS MS: no deformity or atrophy  Skin: warm and dry, no rash Neuro:  Strength and sensation are intact Psych: euthymic mood, full affect   Recent Labs: 03/19/2017: ALT 19; B Natriuretic Peptide  1,726.9 03/21/2017: Hemoglobin 14.9; Platelets 393 03/24/2017: BUN 22; Creatinine, Ser 1.14; Potassium 4.3; Sodium 135    Lipid Panel No results found for: CHOL, TRIG, HDL, CHOLHDL, VLDL, LDLCALC, LDLDIRECT    Wt Readings from Last 3 Encounters:  04/02/17 158 lb 9.6 oz (71.9 kg)  03/25/17 151 lb 7.3 oz (68.7 kg)  01/28/17 158 lb (71.7 kg)      Other studies Reviewed:  Procedures 03/21/17  RIGHT/LEFT HEART CATH AND CORONARY ANGIOGRAPHY  Conclusion   Findings:  Ao = 125/72 (93) LV = 116/5 RA = 2 RV = 32/1 PA = 30/9 (18) PCW = 10 Fick cardiac output/index = 4.6/2.5 PVR = 1.7 WU SVR = 1536 FA sat = 90% PA sat = 57%, 58%  Assessment: 1. Normal coronary arteries 2. NICM EF 30-35% 3. Well-compensated filling pressures after diuresis  Plan/Discussion:  Etiology of NICM unclear. Consider cMRI. Continue medical therapy with titration of  HF meds.   Arvilla Meres, MD 2:32 PM      _ Echocardiogram 11/20/2016 - Left ventricle: The cavity size was mildly dilated. Wall   thickness was normal. Systolic function was severely reduced. The   estimated ejection fraction was in the range of 20% to 25%.   Regional wall motion abnormalities cannot be excluded. Doppler   parameters are consistent with abnormal left ventricular   relaxation (grade 1 diastolic dysfunction). - Aortic valve: There was mild regurgitation. - Mitral valve: There was moderate regurgitation. - Left atrium: The atrium was mildly dilated. - Right ventricle: The cavity size was mildly dilated. Wall   thickness was normal. - Right atrium: The atrium was mildly dilated.  Impressions:  - Severely depressed overall left ventricular function   globally depressed   ejection fraction between 20 and 25%   mild to moderate left ventricular enlargement   mild to moderately reduced RV function   mildly dilated RV   moderate mitral regurgitation. No cardiac source of emboli was   indentified        ASSESSMENT AND PLAN:  1.  Acute on chronic mixed CHF, HFrEF with Grade 1 diastolic dysfunction: Most recent echocardiogram revealing an EF of 20% to 25%.  Patient dry weight is 151-153 lbs on hospital scale, weight on office visit today 58 pounds, at home she is weighing around 156 pounds.  She is medically compliant.  She offers no current symptoms.  Heart rate is not well controlled for current LV systolic function.  She remains on carvedilol 3.125 mg twice daily, I will increase it to 6.25 mg twice daily.  I have advised her that this will reduce her heart rate some and lower her blood pressure some.  If she becomes symptomatic she needs to call us.  She is due to follow-up in the heart failure clinic in 5 days.  Further adjustments to medications can be completed at that time at their discretion.  No further changes or labs at this time.  2.  Chronic kidney disease, stage III: She remains on ARB and spironolactone.  Follow-up labs will likely need to be drawn on heart failure clinic visit.  His recent labs dated 03/24/2017 revealed creatinine 1.14, sodium 135, potassium 4.3, chloride 103   3.  Ongoing tobacco abuse: Tobacco cessation is strongly recommended and discussed.   Current medicines are reviewed at length with the patient today.    Labs/ tests ordered today include:None  Bettey Mare. Liborio Nixon, ANP, AACC   04/02/2017 9:55 AM    [facility] [address] [place], [place] [address] Phone: [phone]; Fax: [phone]

## 2017-04-02 ENCOUNTER — Encounter: Payer: Self-pay | Admitting: Adult Health

## 2017-04-02 ENCOUNTER — Ambulatory Visit (INDEPENDENT_AMBULATORY_CARE_PROVIDER_SITE_OTHER): Payer: Medicare HMO | Admitting: Adult Health

## 2017-04-02 VITALS — BP 102/62 | HR 86 | Ht 73.0 in | Wt 158.6 lb

## 2017-04-02 DIAGNOSIS — I43 Cardiomyopathy in diseases classified elsewhere: Secondary | ICD-10-CM

## 2017-04-02 DIAGNOSIS — I5042 Chronic combined systolic (congestive) and diastolic (congestive) heart failure: Secondary | ICD-10-CM

## 2017-04-02 DIAGNOSIS — R6889 Other general symptoms and signs: Secondary | ICD-10-CM | POA: Diagnosis not present

## 2017-04-02 DIAGNOSIS — I10 Essential (primary) hypertension: Secondary | ICD-10-CM

## 2017-04-02 MED ORDER — CARVEDILOL 6.25 MG PO TABS: 6.2500 mg | ORAL_TABLET | Freq: Two times a day (BID) | ORAL | 6 refills | Status: DC

## 2017-04-02 NOTE — Patient Instructions (Signed)
Medication Instructions:  INCREASE CARVEDILOL 6.25MG  TWICE DAILY  If you need a refill on your cardiac medications before your next appointment, please call your pharmacy.  Special Instructions: MAKE SURE TO KEEP YOUR SCHEDULED APPOINTMENT ON MONDAY  Follow-Up: Your physician wants you to follow-up in: 3 MONTHS WITH DR Jens Som.   Thank you for choosing [facility]!!

## 2017-04-03 ENCOUNTER — Telehealth (HOSPITAL_COMMUNITY): Payer: Self-pay

## 2017-04-03 DIAGNOSIS — R945 Abnormal results of liver function studies: Secondary | ICD-10-CM | POA: Diagnosis not present

## 2017-04-03 DIAGNOSIS — R6889 Other general symptoms and signs: Secondary | ICD-10-CM | POA: Diagnosis not present

## 2017-04-03 NOTE — Telephone Encounter (Signed)
Called to speak with patient in regards to cardiac rehab - patient is not interested in the program. Closed referral.

## 2017-04-07 ENCOUNTER — Encounter: Payer: Medicare HMO | Admitting: *Deleted

## 2017-04-07 VITALS — BP 122/70 | HR 79 | Wt 155.0 lb

## 2017-04-07 DIAGNOSIS — Z006 Encounter for examination for normal comparison and control in clinical research program: Secondary | ICD-10-CM

## 2017-04-07 NOTE — Progress Notes (Signed)
RESEARCH ENCOUNTER  Patient ID: Barbara Oliver  DOB: [birth year]  [name] presented to the Cardiovascular Research Clinic for the Week 2 Visit of the [facility] HF Research Study.  No signs/symptoms of ACS since the last visit. Study procedures completed without event.  Subject compliant with IP.  Subject stated that she threw away a metal pill sleeve after she finished taking the pills.  Reminded the subject to not throw any of the remaining pills and packaging away.  Reinforced that all packaging should be returned to the research office.  Subject verbalized understanding.    Discussed follow up with the Advanced HF Clinic in the future.  Subject deferred at this time.  Advanced HF Clinic information given to subject.  Patient will follow up with Research Clinic in 2 weeks.

## 2017-04-09 DIAGNOSIS — I509 Heart failure, unspecified: Secondary | ICD-10-CM | POA: Diagnosis not present

## 2017-04-09 DIAGNOSIS — R6889 Other general symptoms and signs: Secondary | ICD-10-CM | POA: Diagnosis not present

## 2017-04-09 DIAGNOSIS — R945 Abnormal results of liver function studies: Secondary | ICD-10-CM | POA: Diagnosis not present

## 2017-04-09 DIAGNOSIS — R0602 Shortness of breath: Secondary | ICD-10-CM | POA: Diagnosis not present

## 2017-04-09 DIAGNOSIS — K219 Gastro-esophageal reflux disease without esophagitis: Secondary | ICD-10-CM | POA: Diagnosis not present

## 2017-04-21 ENCOUNTER — Encounter: Payer: Medicare HMO | Admitting: *Deleted

## 2017-04-21 VITALS — BP 119/63 | HR 89 | Wt 163.2 lb

## 2017-04-21 DIAGNOSIS — Z006 Encounter for examination for normal comparison and control in clinical research program: Secondary | ICD-10-CM

## 2017-04-21 NOTE — Progress Notes (Signed)
RESEARCH ENCOUNTER  Patient ID: [name]  DOB: 08/13/1950  Barbara Oliver presented to the Cardiovascular Research Clinic for the Week 4 visit of the [facility].  No signs/symptoms of ACS since the last visit. Subject compliant with IP, IP returned, and additional IP dispensed.    Patient will follow up with Research Clinic in 2 weeks.

## 2017-04-23 DIAGNOSIS — R0602 Shortness of breath: Secondary | ICD-10-CM | POA: Diagnosis not present

## 2017-04-23 DIAGNOSIS — K219 Gastro-esophageal reflux disease without esophagitis: Secondary | ICD-10-CM | POA: Diagnosis not present

## 2017-04-23 DIAGNOSIS — I509 Heart failure, unspecified: Secondary | ICD-10-CM | POA: Diagnosis not present

## 2017-04-23 DIAGNOSIS — J301 Allergic rhinitis due to pollen: Secondary | ICD-10-CM | POA: Diagnosis not present

## 2017-05-05 ENCOUNTER — Encounter: Payer: Medicare HMO | Admitting: *Deleted

## 2017-05-05 VITALS — BP 134/74 | HR 91 | Wt 162.8 lb

## 2017-05-05 DIAGNOSIS — Z006 Encounter for examination for normal comparison and control in clinical research program: Secondary | ICD-10-CM

## 2017-05-05 NOTE — Progress Notes (Signed)
RESEARCH ENCOUNTER  Patient ID: Barbara Oliver  DOB: 1951-01-22  [name] presented to the Cardiovascular Research Clinic for the Week 6 visit of the [facility].  No signs/symptoms of ACS since the last visit.  Noted increased in subject's weight.  Subject states that she weighs herself daily.  In addition, subject states her appetitive has improved greatly since she is not feeling short of breath.  Week 6 study procedures completed without event.    Patient will follow up with Research Clinic in 2 weeks.

## 2017-05-15 DIAGNOSIS — R6889 Other general symptoms and signs: Secondary | ICD-10-CM | POA: Diagnosis not present

## 2017-05-19 ENCOUNTER — Encounter: Payer: Medicare HMO | Admitting: *Deleted

## 2017-05-19 VITALS — BP 126/72 | HR 75 | Wt 161.8 lb

## 2017-05-19 DIAGNOSIS — Z006 Encounter for examination for normal comparison and control in clinical research program: Secondary | ICD-10-CM

## 2017-05-19 NOTE — Progress Notes (Signed)
RESEARCH ENCOUNTER  Patient ID: Barbara Oliver  DOB: 09-20-1950  Barbara Oliver presented to the Cardiovascular Research Clinic for the Week 8 visit of the [facility].  No signs/symptoms of ACS since the last visit. IP returned and additional IP dispensed.  Discussed IP compliance with patient and re-educated on taking 1 pill in the morning and 1 pill in the evening.  Subject stated that she is compliant and takes IP as ordered.    Patient will follow up with Research Clinic in 4 weeks.

## 2017-05-27 DIAGNOSIS — R6889 Other general symptoms and signs: Secondary | ICD-10-CM | POA: Diagnosis not present

## 2017-05-27 DIAGNOSIS — I509 Heart failure, unspecified: Secondary | ICD-10-CM | POA: Diagnosis not present

## 2017-05-27 DIAGNOSIS — K219 Gastro-esophageal reflux disease without esophagitis: Secondary | ICD-10-CM | POA: Diagnosis not present

## 2017-05-27 DIAGNOSIS — J302 Other seasonal allergic rhinitis: Secondary | ICD-10-CM | POA: Diagnosis not present

## 2017-05-27 DIAGNOSIS — R0602 Shortness of breath: Secondary | ICD-10-CM | POA: Diagnosis not present

## 2017-06-16 ENCOUNTER — Encounter: Payer: Medicare HMO | Admitting: *Deleted

## 2017-06-16 VITALS — BP 97/53 | HR 81 | Wt 164.8 lb

## 2017-06-16 DIAGNOSIS — Z006 Encounter for examination for normal comparison and control in clinical research program: Secondary | ICD-10-CM

## 2017-06-16 NOTE — Progress Notes (Signed)
RESEARCH ENCOUNTER  Patient ID: Barbara Oliver  DOB: Feb 12, 1951  [name] presented to the Cardiovascular Research Clinic for the Week 12 visit of the [facility].  No signs/symptoms of ACS since the last visit. IP returned and additional IP dispensed.  All week 12 study procedures completed without event.  Discussed IP compliance with patient and re-educated on taking 1 pill in the morning and 1 pill in the evening.  Subject verbalized understanding.  [name] stated that she does not feel short of breath and does not have difficulty climbing stairs as her bedroom is on the second floor.  She is very thankful and happy to be participating in the study.    Patient will follow up with Research Clinic in June.

## 2017-06-18 DIAGNOSIS — Z1211 Encounter for screening for malignant neoplasm of colon: Secondary | ICD-10-CM | POA: Diagnosis not present

## 2017-06-18 DIAGNOSIS — I509 Heart failure, unspecified: Secondary | ICD-10-CM | POA: Diagnosis not present

## 2017-06-18 DIAGNOSIS — K219 Gastro-esophageal reflux disease without esophagitis: Secondary | ICD-10-CM | POA: Diagnosis not present

## 2017-06-18 DIAGNOSIS — Z79899 Other long term (current) drug therapy: Secondary | ICD-10-CM | POA: Diagnosis not present

## 2017-06-18 DIAGNOSIS — R6889 Other general symptoms and signs: Secondary | ICD-10-CM | POA: Diagnosis not present

## 2017-06-30 NOTE — Progress Notes (Deleted)
HPI: Follow-up cardiomyopathy-Chest CT September 2018 showed right middle lobe nodule and follow-up recommended.  Echocardiogram September 2018 showed ejection fraction 20-25%, mild aortic insufficiency, moderate mitral regurgitation, mild biatrial enlargement and mild right ventricular enlargement.  Cardiac catheterization January 2019 showed normal coronary arteries, ejection fraction 30-35% and pulmonary capillary wedge pressure 10. Pt treated with meds for CHF. Since last seen,   Current Outpatient Medications  Medication Sig Dispense Refill  . acetaminophen (TYLENOL) 500 MG tablet Take 1,000 mg by mouth daily as needed for mild pain.    Marland Kitchen albuterol (PROVENTIL HFA;VENTOLIN HFA) 108 (90 Base) MCG/ACT inhaler Inhale 1-2 puffs into the lungs every 6 (six) hours as needed for wheezing or shortness of breath. (Patient not taking: Reported on 04/02/2017) 1 Inhaler 0  . aspirin EC 81 MG EC tablet Take 1 tablet (81 mg total) by mouth daily.    . carvedilol (COREG) 6.25 MG tablet Take 1 tablet (6.25 mg total) by mouth 2 (two) times daily with a meal. 30 tablet 6  . fluticasone (FLONASE) 50 MCG/ACT nasal spray Place 1 spray into both nostrils daily. 16 g 0  . furosemide (LASIX) 40 MG tablet Take 1 tablet (40 mg total) by mouth daily. 30 tablet 2  . Investigational - Study Medication Study name: GALACTIC Study - Placebo / omecamtiv mecarbil tablet Take 1 tablet by mouth twice daily 1 each PRN  . irbesartan (AVAPRO) 75 MG tablet Take 1 tablet (75 mg total) by mouth daily. (Patient not taking: Reported on 04/02/2017) 30 tablet 2  . pantoprazole (PROTONIX) 40 MG tablet Take 1 tablet (40 mg total) by mouth daily. 30 tablet 0  . spironolactone (ALDACTONE) 25 MG tablet Take 1 tablet (25 mg total) by mouth daily. (Patient not taking: Reported on 04/02/2017) 30 tablet 2  . traMADol (ULTRAM) 50 MG tablet Take 1 tablet (50 mg total) by mouth every 6 (six) hours as needed. (Patient not taking: Reported on  04/02/2017) 6 tablet 0   No current facility-administered medications for this visit.      Past Medical History:  Diagnosis Date  . Acid reflux   . Chronic systolic CHF (congestive heart failure) (HCC)   . CKD (chronic kidney disease), stage III (HCC)   . History of noncompliance with medical treatment, presenting hazards to health   . LBBB (left bundle branch block)   . Lung nodule    a. seen on prior CT, f/u due 11/2017.  . Mitral regurgitation   . NICM (nonischemic cardiomyopathy) (HCC)    a. prior hx of this, EF 20-25% in 11/2016. b. EF 30-35% by cath 03/2017 with normal coronaries.  . Tobacco abuse     Past Surgical History:  Procedure Laterality Date  . HERNIA REPAIR    . RIGHT/LEFT HEART CATH AND CORONARY ANGIOGRAPHY N/A 03/21/2017   Procedure: RIGHT/LEFT HEART CATH AND CORONARY ANGIOGRAPHY;  Surgeon: Dolores Patty, MD;  Location: [facility] INV[facility] CV LAB;  Service: Cardiovascular;  Laterality: N/A;    Social History   Socioeconomic History  . Marital status: Married    Spouse name: Not on file  . Number of children: Not on file  . Years of education: Not on file  . Highest education level: Not on file  Occupational History  . Not on file  Social Needs  . Financial resource strain: Not on file  . Food insecurity:    Worry: Not on file    Inability: Not on file  . Transportation needs:  Medical: Not on file    Non-medical: Not on file  Tobacco Use  . Smoking status: Current Some Day Smoker    Packs/day: 0.00    Types: Cigarettes  . Smokeless tobacco: Never Used  . Tobacco comment: Pt reports a pack will last her about a month  Substance and Sexual Activity  . Alcohol use: No  . Drug use: No  . Sexual activity: Not on file  Lifestyle  . Physical activity:    Days per week: Not on file    Minutes per session: Not on file  . Stress: Not on file  Relationships  . Social connections:    Talks on phone: Not on file    Gets together: Not on file    Attends  religious service: Not on file    Active member of club or organization: Not on file    Attends meetings of clubs or organizations: Not on file    Relationship status: Not on file  . Intimate partner violence:    Fear of current or ex partner: Not on file    Emotionally abused: Not on file    Physically abused: Not on file    Forced sexual activity: Not on file  Other Topics Concern  . Not on file  Social History Narrative  . Not on file    Family History  Problem Relation Age of Onset  . Hypertension Mother   . Heart attack Father     ROS: no fevers or chills, productive cough, hemoptysis, dysphasia, odynophagia, melena, hematochezia, dysuria, hematuria, rash, seizure activity, orthopnea, PND, pedal edema, claudication. Remaining systems are negative.  Physical Exam: Well-developed well-nourished in no acute distress.  Skin is warm and dry.  HEENT is normal.  Neck is supple.  Chest is clear to auscultation with normal expansion.  Cardiovascular exam is regular rate and rhythm.  Abdominal exam nontender or distended. No masses palpated. Extremities show no edema. neuro grossly intact  ECG- personally reviewed  A/P  1 nonischemic cardiomyopathy-continue ARB and beta-blocker.  Repeat echocardiogram.  If ejection fraction less than 35% will refer to electrophysiology for consideration of CRT-D.  2 chronic systolic congestive heart failure-  3 tobacco abuse-patient counseled on discontinuing.  4 chronic stage III kidney disease-recheck potassium and renal function.  5 abnormal chest CT-follow-up chest CT September 2019.  Olga Millers, MD

## 2017-07-08 ENCOUNTER — Ambulatory Visit: Payer: Medicare HMO | Admitting: Cardiology

## 2017-07-09 ENCOUNTER — Encounter: Payer: Self-pay | Admitting: Cardiology

## 2017-07-15 ENCOUNTER — Other Ambulatory Visit: Payer: Self-pay | Admitting: Physician Assistant

## 2017-08-21 DIAGNOSIS — R6889 Other general symptoms and signs: Secondary | ICD-10-CM | POA: Diagnosis not present

## 2017-08-21 DIAGNOSIS — R0981 Nasal congestion: Secondary | ICD-10-CM | POA: Diagnosis not present

## 2017-08-21 DIAGNOSIS — I509 Heart failure, unspecified: Secondary | ICD-10-CM | POA: Diagnosis not present

## 2017-08-21 DIAGNOSIS — R0602 Shortness of breath: Secondary | ICD-10-CM | POA: Diagnosis not present

## 2017-08-21 DIAGNOSIS — K219 Gastro-esophageal reflux disease without esophagitis: Secondary | ICD-10-CM | POA: Diagnosis not present

## 2017-08-22 NOTE — Telephone Encounter (Signed)
Error- Encounter opened in error

## 2017-09-03 ENCOUNTER — Encounter: Payer: Self-pay | Admitting: *Deleted

## 2017-09-03 DIAGNOSIS — Z006 Encounter for examination for normal comparison and control in clinical research program: Secondary | ICD-10-CM

## 2017-09-03 NOTE — Progress Notes (Signed)
Late entry:  Subject to research clinic for visit W24 in the [facility] study.  No cos, aes or saes to report.  Reports feels well and is thankful to be in the trial.   Next visit scheduled.   Subject re-consented to [place] version: 4  Subject met inclusion and exclusion criteria.  The informed consent form, study requirements and expectations were reviewed with the subject and questions and concerns were addressed prior to the signing of the consent form.  The subject verbalized understanding of the trial requirements.  The subject agreed to participate in the CLEAR trial and signed the informed consent.  The informed consent was obtained prior to performance of any protocol-specific procedures for the subject.  A copy of the signed informed consent was given to the subject and a copy was placed in the subject's medical record.

## 2017-09-16 ENCOUNTER — Other Ambulatory Visit: Payer: Self-pay | Admitting: Physician Assistant

## 2017-09-17 NOTE — Telephone Encounter (Signed)
This is Dr. Crenshaw's pt 

## 2017-09-19 ENCOUNTER — Other Ambulatory Visit: Payer: Self-pay | Admitting: Physician Assistant

## 2017-09-22 NOTE — Telephone Encounter (Signed)
Rx has been sent to the pharmacy electronically. ° °

## 2017-10-28 ENCOUNTER — Other Ambulatory Visit: Payer: Self-pay | Admitting: Adult Health

## 2017-11-20 DIAGNOSIS — R5383 Other fatigue: Secondary | ICD-10-CM | POA: Diagnosis not present

## 2017-11-20 DIAGNOSIS — Z79899 Other long term (current) drug therapy: Secondary | ICD-10-CM | POA: Diagnosis not present

## 2017-11-20 DIAGNOSIS — E559 Vitamin D deficiency, unspecified: Secondary | ICD-10-CM | POA: Diagnosis not present

## 2017-11-20 DIAGNOSIS — R6889 Other general symptoms and signs: Secondary | ICD-10-CM | POA: Diagnosis not present

## 2017-11-20 DIAGNOSIS — K219 Gastro-esophageal reflux disease without esophagitis: Secondary | ICD-10-CM | POA: Diagnosis not present

## 2017-11-20 DIAGNOSIS — E7800 Pure hypercholesterolemia, unspecified: Secondary | ICD-10-CM | POA: Diagnosis not present

## 2017-12-01 VITALS — BP 106/59 | HR 80 | Wt 172.0 lb

## 2017-12-01 DIAGNOSIS — Z006 Encounter for examination for normal comparison and control in clinical research program: Secondary | ICD-10-CM

## 2017-12-01 NOTE — Research (Signed)
Barbara Oliver to research clinic for visit W36 in the [place] study.  No complaints, adverse events, or serious adverse events to report.  Subject 78% compliant with medications and new medication dispensed.  Next appointment scheduled.

## 2017-12-12 ENCOUNTER — Other Ambulatory Visit: Payer: Self-pay | Admitting: Adult Health

## 2018-02-19 ENCOUNTER — Telehealth: Payer: Self-pay

## 2018-02-19 DIAGNOSIS — D539 Nutritional anemia, unspecified: Secondary | ICD-10-CM | POA: Diagnosis not present

## 2018-02-19 DIAGNOSIS — Z79899 Other long term (current) drug therapy: Secondary | ICD-10-CM | POA: Diagnosis not present

## 2018-02-19 DIAGNOSIS — M129 Arthropathy, unspecified: Secondary | ICD-10-CM | POA: Diagnosis not present

## 2018-02-19 DIAGNOSIS — I509 Heart failure, unspecified: Secondary | ICD-10-CM | POA: Diagnosis not present

## 2018-02-19 DIAGNOSIS — R062 Wheezing: Secondary | ICD-10-CM | POA: Diagnosis not present

## 2018-02-19 DIAGNOSIS — E7800 Pure hypercholesterolemia, unspecified: Secondary | ICD-10-CM | POA: Diagnosis not present

## 2018-02-19 DIAGNOSIS — R6889 Other general symptoms and signs: Secondary | ICD-10-CM | POA: Diagnosis not present

## 2018-02-19 DIAGNOSIS — E559 Vitamin D deficiency, unspecified: Secondary | ICD-10-CM | POA: Diagnosis not present

## 2018-02-19 NOTE — Telephone Encounter (Signed)
Patient called to update new phone number.

## 2018-02-23 ENCOUNTER — Encounter: Payer: Medicare HMO | Admitting: *Deleted

## 2018-02-23 VITALS — BP 107/69 | HR 82 | Wt 180.8 lb

## 2018-02-23 DIAGNOSIS — Z006 Encounter for examination for normal comparison and control in clinical research program: Secondary | ICD-10-CM

## 2018-02-23 NOTE — Research (Signed)
Subject to research clinic for visit Week 48 in the [employer] HF study.  No cos, aes or saes to report. Subject 71% compliant with meds and re-educated to importance of taking medication as directed.  Next appointment scheduled and new meds dispensed.

## 2018-02-24 DIAGNOSIS — M25561 Pain in right knee: Secondary | ICD-10-CM | POA: Diagnosis not present

## 2018-02-24 DIAGNOSIS — R6889 Other general symptoms and signs: Secondary | ICD-10-CM | POA: Diagnosis not present

## 2018-02-24 DIAGNOSIS — M79671 Pain in right foot: Secondary | ICD-10-CM | POA: Diagnosis not present

## 2018-02-26 DIAGNOSIS — E7800 Pure hypercholesterolemia, unspecified: Secondary | ICD-10-CM | POA: Diagnosis not present

## 2018-02-26 DIAGNOSIS — B373 Candidiasis of vulva and vagina: Secondary | ICD-10-CM | POA: Diagnosis not present

## 2018-02-26 DIAGNOSIS — M79671 Pain in right foot: Secondary | ICD-10-CM | POA: Diagnosis not present

## 2018-02-26 DIAGNOSIS — M25561 Pain in right knee: Secondary | ICD-10-CM | POA: Diagnosis not present

## 2018-02-26 DIAGNOSIS — R6889 Other general symptoms and signs: Secondary | ICD-10-CM | POA: Diagnosis not present

## 2018-03-25 ENCOUNTER — Telehealth: Payer: Self-pay | Admitting: *Deleted

## 2018-03-25 NOTE — Telephone Encounter (Signed)
Left message for patient to call and schedule an appointment for a lab redraw BMET, specifically K+ was 5.2, per Dr. Shirlee Latch. This is my second attempt to have this scheduled.

## 2018-03-30 ENCOUNTER — Telehealth: Payer: Self-pay | Admitting: *Deleted

## 2018-03-30 NOTE — Telephone Encounter (Signed)
Barbara Oliver called the clinic in response to a recent message to return to the clinic for a K+ re-draw.  Scheduled her a ride to and from the clinic with [employer], Thelma Barge, and she will be arriving on 03/31/2018 At 0900.  Education given to the subject regarding decreasing high potassium foods.

## 2018-03-31 ENCOUNTER — Encounter: Payer: Medicare HMO | Admitting: *Deleted

## 2018-03-31 VITALS — BP 137/69 | HR 72 | Wt 189.4 lb

## 2018-03-31 DIAGNOSIS — Z006 Encounter for examination for normal comparison and control in clinical research program: Secondary | ICD-10-CM

## 2018-03-31 NOTE — Research (Signed)
Patient to research clinic for lab re-draw per Dr. Shirlee Latch.

## 2018-04-01 LAB — COMPREHENSIVE METABOLIC PANEL
ALBUMIN: 4 g/dL (ref 3.8–4.8)
ALT: 21 IU/L (ref 0–32)
AST: 23 IU/L (ref 0–40)
Albumin/Globulin Ratio: 1.1 — ABNORMAL LOW (ref 1.2–2.2)
Alkaline Phosphatase: 162 IU/L — ABNORMAL HIGH (ref 39–117)
BILIRUBIN TOTAL: 0.2 mg/dL (ref 0.0–1.2)
BUN / CREAT RATIO: 17 (ref 12–28)
BUN: 18 mg/dL (ref 8–27)
CHLORIDE: 103 mmol/L (ref 96–106)
CO2: 19 mmol/L — ABNORMAL LOW (ref 20–29)
CREATININE: 1.09 mg/dL — AB (ref 0.57–1.00)
Calcium: 9.2 mg/dL (ref 8.7–10.3)
GFR, EST AFRICAN AMERICAN: 61 mL/min/{1.73_m2} (ref >59)
GFR, EST NON AFRICAN AMERICAN: 53 mL/min/{1.73_m2} — AB (ref >59)
GLUCOSE: 64 mg/dL — AB (ref 65–99)
Globulin, Total: 3.5 g/dL (ref 1.5–4.5)
Potassium: 4.5 mmol/L (ref 3.5–5.2)
Sodium: 142 mmol/L (ref 134–144)
TOTAL PROTEIN: 7.5 g/dL (ref 6.0–8.5)

## 2018-04-07 DIAGNOSIS — E7800 Pure hypercholesterolemia, unspecified: Secondary | ICD-10-CM | POA: Diagnosis not present

## 2018-04-07 DIAGNOSIS — R6889 Other general symptoms and signs: Secondary | ICD-10-CM | POA: Diagnosis not present

## 2018-04-07 DIAGNOSIS — M79671 Pain in right foot: Secondary | ICD-10-CM | POA: Diagnosis not present

## 2018-04-07 DIAGNOSIS — E611 Iron deficiency: Secondary | ICD-10-CM | POA: Diagnosis not present

## 2018-04-07 DIAGNOSIS — M25561 Pain in right knee: Secondary | ICD-10-CM | POA: Diagnosis not present

## 2018-04-17 DIAGNOSIS — R300 Dysuria: Secondary | ICD-10-CM | POA: Diagnosis not present

## 2018-04-17 DIAGNOSIS — A499 Bacterial infection, unspecified: Secondary | ICD-10-CM | POA: Diagnosis not present

## 2018-04-17 DIAGNOSIS — N390 Urinary tract infection, site not specified: Secondary | ICD-10-CM | POA: Diagnosis not present

## 2018-04-24 DIAGNOSIS — R05 Cough: Secondary | ICD-10-CM | POA: Diagnosis not present

## 2018-04-24 DIAGNOSIS — R300 Dysuria: Secondary | ICD-10-CM | POA: Diagnosis not present

## 2018-04-24 DIAGNOSIS — R6889 Other general symptoms and signs: Secondary | ICD-10-CM | POA: Diagnosis not present

## 2018-04-24 DIAGNOSIS — J209 Acute bronchitis, unspecified: Secondary | ICD-10-CM | POA: Diagnosis not present

## 2018-04-24 DIAGNOSIS — R509 Fever, unspecified: Secondary | ICD-10-CM | POA: Diagnosis not present

## 2018-04-24 DIAGNOSIS — R0602 Shortness of breath: Secondary | ICD-10-CM | POA: Diagnosis not present

## 2018-04-25 ENCOUNTER — Inpatient Hospital Stay (HOSPITAL_COMMUNITY)
Admission: EM | Admit: 2018-04-25 | Discharge: 2018-04-28 | DRG: 190 | Disposition: A | Discharge status: Home / Self Care | Payer: Medicare HMO | Attending: Internal Medicine | Admitting: Internal Medicine

## 2018-04-25 ENCOUNTER — Encounter (HOSPITAL_COMMUNITY): Payer: Self-pay | Admitting: Emergency Medicine

## 2018-04-25 ENCOUNTER — Ambulatory Visit (HOSPITAL_COMMUNITY): Payer: Medicare HMO

## 2018-04-25 ENCOUNTER — Emergency Department (HOSPITAL_COMMUNITY): Payer: Medicare HMO

## 2018-04-25 DIAGNOSIS — J209 Acute bronchitis, unspecified: Secondary | ICD-10-CM | POA: Diagnosis present

## 2018-04-25 DIAGNOSIS — N183 Chronic kidney disease, stage 3 unspecified: Secondary | ICD-10-CM | POA: Diagnosis present

## 2018-04-25 DIAGNOSIS — Z7982 Long term (current) use of aspirin: Secondary | ICD-10-CM | POA: Diagnosis not present

## 2018-04-25 DIAGNOSIS — Z7951 Long term (current) use of inhaled steroids: Secondary | ICD-10-CM | POA: Diagnosis not present

## 2018-04-25 DIAGNOSIS — K219 Gastro-esophageal reflux disease without esophagitis: Secondary | ICD-10-CM | POA: Diagnosis present

## 2018-04-25 DIAGNOSIS — R05 Cough: Secondary | ICD-10-CM | POA: Diagnosis not present

## 2018-04-25 DIAGNOSIS — R0603 Acute respiratory distress: Secondary | ICD-10-CM | POA: Diagnosis not present

## 2018-04-25 DIAGNOSIS — R911 Solitary pulmonary nodule: Secondary | ICD-10-CM | POA: Diagnosis present

## 2018-04-25 DIAGNOSIS — J9601 Acute respiratory failure with hypoxia: Secondary | ICD-10-CM | POA: Diagnosis present

## 2018-04-25 DIAGNOSIS — R739 Hyperglycemia, unspecified: Secondary | ICD-10-CM | POA: Diagnosis present

## 2018-04-25 DIAGNOSIS — I5023 Acute on chronic systolic (congestive) heart failure: Secondary | ICD-10-CM | POA: Diagnosis present

## 2018-04-25 DIAGNOSIS — F17210 Nicotine dependence, cigarettes, uncomplicated: Secondary | ICD-10-CM | POA: Diagnosis present

## 2018-04-25 DIAGNOSIS — E785 Hyperlipidemia, unspecified: Secondary | ICD-10-CM | POA: Diagnosis present

## 2018-04-25 DIAGNOSIS — Z720 Tobacco use: Secondary | ICD-10-CM | POA: Diagnosis present

## 2018-04-25 DIAGNOSIS — Z79899 Other long term (current) drug therapy: Secondary | ICD-10-CM

## 2018-04-25 DIAGNOSIS — Z8249 Family history of ischemic heart disease and other diseases of the circulatory system: Secondary | ICD-10-CM

## 2018-04-25 DIAGNOSIS — R0602 Shortness of breath: Secondary | ICD-10-CM | POA: Diagnosis present

## 2018-04-25 DIAGNOSIS — I361 Nonrheumatic tricuspid (valve) insufficiency: Secondary | ICD-10-CM

## 2018-04-25 DIAGNOSIS — I447 Left bundle-branch block, unspecified: Secondary | ICD-10-CM | POA: Diagnosis present

## 2018-04-25 DIAGNOSIS — I428 Other cardiomyopathies: Secondary | ICD-10-CM | POA: Diagnosis present

## 2018-04-25 DIAGNOSIS — Z66 Do not resuscitate: Secondary | ICD-10-CM | POA: Diagnosis present

## 2018-04-25 DIAGNOSIS — J440 Chronic obstructive pulmonary disease with acute lower respiratory infection: Principal | ICD-10-CM | POA: Diagnosis present

## 2018-04-25 DIAGNOSIS — Z9119 Patient's noncompliance with other medical treatment and regimen: Secondary | ICD-10-CM

## 2018-04-25 DIAGNOSIS — Z87440 Personal history of urinary (tract) infections: Secondary | ICD-10-CM

## 2018-04-25 DIAGNOSIS — N179 Acute kidney failure, unspecified: Secondary | ICD-10-CM | POA: Diagnosis present

## 2018-04-25 DIAGNOSIS — J80 Acute respiratory distress syndrome: Secondary | ICD-10-CM | POA: Diagnosis not present

## 2018-04-25 DIAGNOSIS — J441 Chronic obstructive pulmonary disease with (acute) exacerbation: Secondary | ICD-10-CM | POA: Diagnosis present

## 2018-04-25 DIAGNOSIS — I130 Hypertensive heart and chronic kidney disease with heart failure and stage 1 through stage 4 chronic kidney disease, or unspecified chronic kidney disease: Secondary | ICD-10-CM | POA: Diagnosis present

## 2018-04-25 DIAGNOSIS — I340 Nonrheumatic mitral (valve) insufficiency: Secondary | ICD-10-CM | POA: Diagnosis present

## 2018-04-25 DIAGNOSIS — N289 Disorder of kidney and ureter, unspecified: Secondary | ICD-10-CM

## 2018-04-25 HISTORY — DX: Acute respiratory distress: R06.03

## 2018-04-25 HISTORY — DX: Acute bronchitis, unspecified: J20.9

## 2018-04-25 LAB — BASIC METABOLIC PANEL
Anion gap: 13 (ref 5–15)
BUN: 23 mg/dL (ref 8–23)
CALCIUM: 9.7 mg/dL (ref 8.9–10.3)
CO2: 20 mmol/L — AB (ref 22–32)
Chloride: 100 mmol/L (ref 98–111)
Creatinine, Ser: 1.67 mg/dL — ABNORMAL HIGH (ref 0.44–1.00)
GFR calc Af Amer: 37 mL/min — ABNORMAL LOW (ref >60)
GFR, EST NON AFRICAN AMERICAN: 32 mL/min — AB (ref >60)
Glucose, Bld: 114 mg/dL — ABNORMAL HIGH (ref 70–99)
Potassium: 4.5 mmol/L (ref 3.5–5.1)
Sodium: 133 mmol/L — ABNORMAL LOW (ref 135–145)

## 2018-04-25 LAB — CBC WITH DIFFERENTIAL/PLATELET
Abs Immature Granulocytes: 0.02 10*3/uL (ref 0.00–0.07)
BASOS PCT: 0 %
Basophils Absolute: 0 10*3/uL (ref 0.0–0.1)
EOS ABS: 0 10*3/uL (ref 0.0–0.5)
EOS PCT: 0 %
HEMATOCRIT: 37.9 % (ref 36.0–46.0)
Hemoglobin: 11.8 g/dL — ABNORMAL LOW (ref 12.0–15.0)
Immature Granulocytes: 0 %
LYMPHS ABS: 2.3 10*3/uL (ref 0.7–4.0)
Lymphocytes Relative: 28 %
MCH: 27.8 pg (ref 26.0–34.0)
MCHC: 31.1 g/dL (ref 30.0–36.0)
MCV: 89.4 fL (ref 80.0–100.0)
MONOS PCT: 6 %
Monocytes Absolute: 0.5 10*3/uL (ref 0.1–1.0)
NEUTROS PCT: 66 %
Neutro Abs: 5.3 10*3/uL (ref 1.7–7.7)
PLATELETS: 415 10*3/uL — AB (ref 150–400)
RBC: 4.24 MIL/uL (ref 3.87–5.11)
RDW: 13.5 % (ref 11.5–15.5)
WBC: 8.2 10*3/uL (ref 4.0–10.5)
nRBC: 0 % (ref 0.0–0.2)

## 2018-04-25 LAB — POCT I-STAT 7, (LYTES, BLD GAS, ICA,H+H)
Acid-Base Excess: 2 mmol/L (ref 0.0–2.0)
BICARBONATE: 23.6 mmol/L (ref 20.0–28.0)
CALCIUM ION: 1.16 mmol/L (ref 1.15–1.40)
HCT: 35 % — ABNORMAL LOW (ref 36.0–46.0)
Hemoglobin: 11.9 g/dL — ABNORMAL LOW (ref 12.0–15.0)
O2 Saturation: 100 %
PCO2 ART: 26.4 mmHg — AB (ref 32.0–48.0)
PO2 ART: 204 mmHg — AB (ref 83.0–108.0)
POTASSIUM: 4.4 mmol/L (ref 3.5–5.1)
Sodium: 135 mmol/L (ref 135–145)
TCO2: 24 mmol/L (ref 22–32)
pH, Arterial: 7.559 — ABNORMAL HIGH (ref 7.350–7.450)

## 2018-04-25 LAB — BRAIN NATRIURETIC PEPTIDE: B Natriuretic Peptide: 526.4 pg/mL — ABNORMAL HIGH (ref 0.0–100.0)

## 2018-04-25 LAB — MRSA PCR SCREENING: MRSA by PCR: NEGATIVE

## 2018-04-25 LAB — I-STAT TROPONIN, ED: Troponin i, poc: 0.02 ng/mL (ref 0.00–0.08)

## 2018-04-25 LAB — TROPONIN I

## 2018-04-25 MED ORDER — PANTOPRAZOLE SODIUM 40 MG PO TBEC
40.0000 mg | DELAYED_RELEASE_TABLET | Freq: Every day | ORAL | Status: DC
  Administered 2018-04-25 – 2018-04-28 (×4): 40 mg via ORAL
  Filled 2018-04-25: qty 1
  Filled 2018-04-25: qty 2
  Filled 2018-04-25 (×2): qty 1

## 2018-04-25 MED ORDER — LEVOFLOXACIN 750 MG PO TABS
750.0000 mg | ORAL_TABLET | ORAL | Status: DC
  Administered 2018-04-25 – 2018-04-27 (×2): 750 mg via ORAL
  Filled 2018-04-25 (×2): qty 1

## 2018-04-25 MED ORDER — SODIUM CHLORIDE 0.9% FLUSH
3.0000 mL | Freq: Two times a day (BID) | INTRAVENOUS | Status: DC
  Administered 2018-04-25 – 2018-04-28 (×7): 3 mL via INTRAVENOUS

## 2018-04-25 MED ORDER — UMECLIDINIUM-VILANTEROL 62.5-25 MCG/INH IN AEPB: 1.0000 | INHALATION_SPRAY | Freq: Every day | RESPIRATORY_TRACT | Status: DC | PRN

## 2018-04-25 MED ORDER — IPRATROPIUM-ALBUTEROL 0.5-2.5 (3) MG/3ML IN SOLN
3.0000 mL | Freq: Once | RESPIRATORY_TRACT | Status: AC
  Administered 2018-04-25: 3 mL via RESPIRATORY_TRACT
  Filled 2018-04-25: qty 3

## 2018-04-25 MED ORDER — ALBUTEROL SULFATE (2.5 MG/3ML) 0.083% IN NEBU
2.5000 mg | INHALATION_SOLUTION | Freq: Once | RESPIRATORY_TRACT | Status: AC
  Administered 2018-04-25: 2.5 mg via RESPIRATORY_TRACT
  Filled 2018-04-25: qty 3

## 2018-04-25 MED ORDER — ZOLPIDEM TARTRATE 5 MG PO TABS
5.0000 mg | ORAL_TABLET | Freq: Once | ORAL | Status: AC
  Administered 2018-04-25: 5 mg via ORAL
  Filled 2018-04-25: qty 1

## 2018-04-25 MED ORDER — SODIUM CHLORIDE 0.9 % IV SOLN: 250.0000 mL | INTRAVENOUS | Status: DC | PRN

## 2018-04-25 MED ORDER — FUROSEMIDE 10 MG/ML IJ SOLN
40.0000 mg | Freq: Once | INTRAMUSCULAR | Status: AC
  Administered 2018-04-25: 40 mg via INTRAVENOUS
  Filled 2018-04-25: qty 4

## 2018-04-25 MED ORDER — ATORVASTATIN CALCIUM 10 MG PO TABS
20.0000 mg | ORAL_TABLET | Freq: Every day | ORAL | Status: DC
  Administered 2018-04-25 – 2018-04-28 (×4): 20 mg via ORAL
  Filled 2018-04-25 (×4): qty 2

## 2018-04-25 MED ORDER — FLUTICASONE PROPIONATE 50 MCG/ACT NA SUSP
1.0000 | Freq: Every day | NASAL | Status: DC
  Administered 2018-04-26 – 2018-04-28 (×3): 1 via NASAL
  Filled 2018-04-25: qty 16

## 2018-04-25 MED ORDER — METHYLPREDNISOLONE SODIUM SUCC 125 MG IJ SOLR
80.0000 mg | Freq: Two times a day (BID) | INTRAMUSCULAR | Status: DC
  Administered 2018-04-25 – 2018-04-27 (×4): 80 mg via INTRAVENOUS
  Filled 2018-04-25 (×4): qty 2

## 2018-04-25 MED ORDER — ACETAMINOPHEN 325 MG PO TABS
650.0000 mg | ORAL_TABLET | ORAL | Status: DC | PRN
  Administered 2018-04-26 – 2018-04-27 (×2): 650 mg via ORAL
  Filled 2018-04-25 (×2): qty 2

## 2018-04-25 MED ORDER — ASPIRIN EC 81 MG PO TBEC
81.0000 mg | DELAYED_RELEASE_TABLET | Freq: Every day | ORAL | Status: DC
  Administered 2018-04-25 – 2018-04-28 (×4): 81 mg via ORAL
  Filled 2018-04-25 (×4): qty 1

## 2018-04-25 MED ORDER — METHYLPREDNISOLONE SODIUM SUCC 125 MG IJ SOLR
125.0000 mg | Freq: Once | INTRAMUSCULAR | Status: AC
  Administered 2018-04-25: 125 mg via INTRAVENOUS
  Filled 2018-04-25: qty 2

## 2018-04-25 MED ORDER — ONDANSETRON HCL 4 MG/2ML IJ SOLN: 4.0000 mg | Freq: Four times a day (QID) | INTRAMUSCULAR | Status: DC | PRN

## 2018-04-25 MED ORDER — ENOXAPARIN SODIUM 40 MG/0.4ML ~~LOC~~ SOLN
40.0000 mg | SUBCUTANEOUS | Status: DC
  Administered 2018-04-25 – 2018-04-27 (×3): 40 mg via SUBCUTANEOUS
  Filled 2018-04-25 (×3): qty 0.4

## 2018-04-25 MED ORDER — IPRATROPIUM-ALBUTEROL 0.5-2.5 (3) MG/3ML IN SOLN
3.0000 mL | Freq: Four times a day (QID) | RESPIRATORY_TRACT | Status: DC
  Administered 2018-04-25 – 2018-04-26 (×5): 3 mL via RESPIRATORY_TRACT
  Filled 2018-04-25 (×5): qty 3

## 2018-04-25 MED ORDER — GUAIFENESIN 100 MG/5ML PO SOLN
5.0000 mL | ORAL | Status: DC | PRN
  Administered 2018-04-25 – 2018-04-28 (×5): 100 mg via ORAL
  Filled 2018-04-25 (×5): qty 5

## 2018-04-25 MED ORDER — FUROSEMIDE 10 MG/ML IJ SOLN
40.0000 mg | Freq: Two times a day (BID) | INTRAMUSCULAR | Status: DC
  Administered 2018-04-25 – 2018-04-26 (×2): 40 mg via INTRAVENOUS
  Filled 2018-04-25 (×2): qty 4

## 2018-04-25 MED ORDER — SODIUM CHLORIDE 0.9% FLUSH
3.0000 mL | INTRAVENOUS | Status: DC | PRN
  Administered 2018-04-26: 3 mL via INTRAVENOUS
  Filled 2018-04-25: qty 3

## 2018-04-25 MED ORDER — ALBUTEROL SULFATE (2.5 MG/3ML) 0.083% IN NEBU: 2.5000 mg | INHALATION_SOLUTION | RESPIRATORY_TRACT | Status: DC | PRN

## 2018-04-25 MED ORDER — MOMETASONE FURO-FORMOTEROL FUM 200-5 MCG/ACT IN AERO
2.0000 | INHALATION_SPRAY | Freq: Two times a day (BID) | RESPIRATORY_TRACT | Status: DC
  Administered 2018-04-26 – 2018-04-28 (×5): 2 via RESPIRATORY_TRACT
  Filled 2018-04-25 (×2): qty 8.8

## 2018-04-25 MED ORDER — CARVEDILOL 6.25 MG PO TABS
6.2500 mg | ORAL_TABLET | Freq: Two times a day (BID) | ORAL | Status: DC
  Administered 2018-04-25 – 2018-04-28 (×6): 6.25 mg via ORAL
  Filled 2018-04-25 (×6): qty 1

## 2018-04-25 NOTE — ED Notes (Signed)
Bipap removed for 5 minutes so that pt could make phone call, pt tolerated break well. Bipap back on now. Spo2 100%

## 2018-04-25 NOTE — ED Notes (Signed)
RT trialing pt off bipap at this time, 6L 02 by [place]

## 2018-04-25 NOTE — ED Notes (Signed)
Pt now on bi-pap. 

## 2018-04-25 NOTE — H&P (Signed)
History and Physical    Barbara Oliver JYN:[id number] DOB: Jun 27, 1950 DOA: 04/25/2018  PCP: Virl AxeKartaoui, [place], FNP Consultants:  She doesn't have a heart doctor, "[facility]" Patient coming from:  Home - lives with brother and roommate; NOKJethro Bolus: Roommate, [phone]  Chief Complaint:  SOB  HPI: Barbara Croonarldine Azzara is a 68 y.o. female with medical history significant of NICM with systolic heart failure; LBBB; and stage 3 CKD presenting with SOB.  She reports similar symptoms to when she had initial CHF diagnosis about a year ago.  Symptoms started Tuesday and she hasn't been able to sleep since.  She can't sleep, can't lie flat.  She coughs even while sitting up.  Can't eat because she coughs so hard she vomits.  She had a UTI yesterday and was wheezing; she was given a neb to take home, a steroid, and an antibiotic shot.  This AM, she was coughing too hard and had to call 911.  She cannot lie flat at all.  +PND.  Cough is productive of grayish-greenish looking phlegm.  Has been feeling feverish.  No sick contacts.  Occasional edema, none currently - different from the first time.  +20 pound weight gain in the last year.  She is a taking a "placebo pill they give me" for CHF, Candis SchatzMaggie Chisholm enrolled her in the study initially.   ED Course:  Severe CHF on history.  SOB, cough - combined picture of CHF and bronchitis.  Started on steroids and abx by PCP with ongoing symptoms.  +orthopnea.  Mild interstitial prominence, BNP 500 but better than baseline.  Significant dyspnea including at rest, sats ok on RA (taken off BIPAP and weaned to O2 and off O2 now).  Persistent symptoms.  Review of Systems: As per HPI; otherwise review of systems reviewed and negative.   Ambulatory Status:  Ambulates without assistance  Past Medical History:  Diagnosis Date  . Acid reflux   . Chronic systolic CHF (congestive heart failure) (HCC)   . CKD (chronic kidney  disease), stage III (HCC)   . History of noncompliance with medical treatment, presenting hazards to health   . LBBB (left bundle branch block)   . Lung nodule    a. seen on prior CT, f/u due 11/2017.  . Mitral regurgitation   . NICM (nonischemic cardiomyopathy) (HCC)    a. prior hx of this, EF 20-25% in 11/2016. b. EF 30-35% by cath 03/2017 with normal coronaries.  . Tobacco abuse     Past Surgical History:  Procedure Laterality Date  . HERNIA REPAIR    . RIGHT/LEFT HEART CATH AND CORONARY ANGIOGRAPHY N/A 03/21/2017   Procedure: RIGHT/LEFT HEART CATH AND CORONARY ANGIOGRAPHY;  Surgeon: Dolores PattyBensimhon, Daniel R, MD;  Location: [facility];  Service: Cardiovascular;  Laterality: N/A;    Social History   Socioeconomic History  . Marital status: Married    Spouse name: Not on file  . Number of children: Not on file  . Years of education: Not on file  . Highest education level: Not on file  Occupational History  . Occupation: retired  Engineer, productionocial Needs  . Financial resource strain: Not on file  . Food insecurity:    Worry: Not on file    Inability: Not on file  . Transportation needs:    Medical: Not on file    Non-medical: Not on file  Tobacco Use  . Smoking status: Current Every Day Smoker  Packs/day: 0.50    Years: 50.00    Pack years: 25.00    Types: Cigarettes  . Smokeless tobacco: Never Used  . Tobacco comment: Pt reports a pack will last her about a month  Substance and Sexual Activity  . Alcohol use: No  . Drug use: No  . Sexual activity: Not on file  Lifestyle  . Physical activity:    Days per week: Not on file    Minutes per session: Not on file  . Stress: Not on file  Relationships  . Social connections:    Talks on phone: Not on file    Gets together: Not on file    Attends religious service: Not on file    Active member of club or organization: Not on file    Attends meetings of clubs or organizations: Not on file    Relationship status: Not on file  .  Intimate partner violence:    Fear of current or ex partner: Not on file    Emotionally abused: Not on file    Physically abused: Not on file    Forced sexual activity: Not on file  Other Topics Concern  . Not on file  Social History Narrative  . Not on file    No Known Allergies  Family History  Problem Relation Age of Onset  . Hypertension Mother   . Heart attack Father     Prior to Admission medications   Medication Sig Start Date End Date Taking? Authorizing Provider  acetaminophen (TYLENOL) 500 MG tablet Take 1,000 mg by mouth daily as needed for mild pain.   Yes [provider]  albuterol (PROVENTIL HFA;VENTOLIN HFA) 108 (90 Base) MCG/ACT inhaler Inhale 1-2 puffs into the lungs every 6 (six) hours as needed for wheezing or shortness of breath. 01/28/17  Yes Caccavale, Sophia, PA-C  aspirin EC 81 MG EC tablet Take 1 tablet (81 mg total) by mouth daily. 03/12/16  Yes Gouru, Deanna Artis, MD  atorvastatin (LIPITOR) 20 MG tablet Take 20 mg by mouth daily.   Yes [provider]  budesonide-formoterol (SYMBICORT) 160-4.5 MCG/ACT inhaler Inhale 2 puffs into the lungs 2 (two) times daily as needed (SOB).   Yes [provider]  carvedilol (COREG) 6.25 MG tablet Take 1 tablet (6.25 mg total) by mouth 2 (two) times daily with a meal. 12/12/17  Yes Crenshaw, Madolyn Frieze, MD  fluticasone (FLONASE) 50 MCG/ACT nasal spray Place 1 spray into both nostrils daily. 01/28/17  Yes Caccavale, Sophia, PA-C  furosemide (LASIX) 40 MG tablet TAKE 1 TABLET BY MOUTH DAILY. Patient taking differently: Take 40 mg by mouth daily.  09/22/17  Yes Jodelle Gross, NP  Investigational - Study Medication Study name: GALACTIC Study - Placebo / omecamtiv mecarbil tablet Take 1 tablet by mouth twice daily 03/25/17  Yes Dunn, Dayna N, PA-C  irbesartan (AVAPRO) 75 MG tablet TAKE 1 TABLET BY MOUTH DAILY. Patient taking differently: Take 75 mg by mouth daily.  07/16/17  Yes Jodelle Gross, NP    levofloxacin (LEVAQUIN) 750 MG tablet Take 750 mg by mouth daily.   Yes [provider]  pantoprazole (PROTONIX) 40 MG tablet Take 1 tablet (40 mg total) by mouth daily. 03/11/16  Yes Gouru, Deanna Artis, MD  spironolactone (ALDACTONE) 25 MG tablet Take 1 tablet (25 mg total) by mouth daily. 12/12/17  Yes Lewayne Bunting, MD  umeclidinium-vilanterol (ANORO ELLIPTA) 62.5-25 MCG/INH AEPB Inhale 1 puff into the lungs daily as needed (SOB).   Yes [provider]    Physical Exam: Vitals:   04/25/18 1230 04/25/18 1315 04/25/18 1345 04/25/18 1400  BP: 131/75  127/77 129/72  Pulse:      Resp: 18 20 (!) 24 (!) 21  Temp:      TempSrc:      SpO2: 100% 100% 100% 99%  Weight:      Height:         . General:  Appeared calm and comfortable and was NAD; she was mildly SOB with intermittent coarse cough and was placed on BIPAP shortly after my evaluation.  She has since been removed from BIPAP again. . Eyes:  PERRL, EOMI, normal lids, iris . ENT:  grossly normal hearing, lips & tongue, mmm . Neck:  no LAD, masses or thyromegaly; no carotid bruits . Cardiovascular:  RRR, no m/r/g. No LE edema.  Marland Kitchen Respiratory:  Diffuse wheezing with mildly incrased respiratory effort and mildly increased WOB . Abdomen:  soft, NT, ND, NABS . Back:   normal alignment, no CVAT . Skin:  no rash or induration seen on limited exam . Musculoskeletal:  grossly normal tone BUE/BLE, good ROM, no bony abnormality . Psychiatric:  grossly normal mood and affect, speech fluent and appropriate, AOx3 . Neurologic:  CN 2-12 grossly intact, moves all extremities in coordinated fashion, sensation intact    Radiological Exams on Admission: Dg Chest Port 1 View  Result Date: 04/25/2018 CLINICAL DATA:  Shortness of breath EXAM: PORTABLE CHEST 1 VIEW COMPARISON:  March 19, 2017 FINDINGS: There is stable cardiomegaly with pulmonary vascularity within normal limits. There is nodular interstitial prominence, stable. There is  no consolidation or pleural effusion. There is aortic atherosclerosis. No adenopathy. There is an old healed fracture of the left lateral sixth rib. IMPRESSION: Stable cardiomegaly. Mild nodular interstitial prominence throughout the lungs, stable. Question mild chronic interstitial edema versus underlying interstitial lung disease. No new opacity evident. No consolidation in particular. Aortic Atherosclerosis (ICD10-I70.0). Electronically Signed   By: Bretta Bang III M.D.   On: 04/25/2018 07:03    EKG: Independently reviewed.  NSR with rate 82; LBBB with NSCSLT   Labs on Admission: I have personally reviewed the available labs and imaging studies at the time of the admission.  Pertinent labs:   ABG at 0612: 7.559/26.4/204/23.6 CO2 20 Glucose 114 BUN 23/Creatinine 1.67/GFR 37; 18/1.09/61 on 1/21 BNP 526.4; 1726.9 on 1/9 Troponin <0.03 WBC 8.2 Hgb 11.8 Platelets 415   Assessment/Plan Principal Problem:   Respiratory distress Active Problems:   Acute renal insufficiency   Hyperglycemia   Acute on chronic systolic (congestive) heart failure (HCC)   CKD (chronic kidney disease), stage III (HCC)   Tobacco abuse   COPD with acute bronchitis (HCC)   Respiratory distress -Patient present with what appears to be a combined picture of CHF and bronchitis -She has required BIPAP twice since presenting -She has been placed in SDU for observation for now  Acute on chronic systolic CHF -Patient presenting with worsening SOB and cough despite recent antibiotics -Symptoms are c/w prior diagnosis of severe systolic CHF -CXR consistent with mild pulmonary edema vs underlying lung disease -Normal WBC count -Elevated BNP, but less than with prior hospitalization -With elevated BNP and abnl CXR, CHF seems to be at least a contributing factor -Will place in observation status with telemetry -Will request echocardiogram  -Will continue ASA -Will hold ARB -Continue Coreg -CHF order set  utilized; may need CHF team consult but will hold until Echo results are available -Was  given Lasix 40 mg x 1 in ER and will repeat with 40 mg IV BID -Continue [place] O2 for now with prn BIPAP available -Repeat EKG in AM  COPD with acute bronchitis -Patient's shortness of breath and tight cough with marked wheezing as also concerning for bronchitis/COPD exacerbation -She does not have a known reported diagnosis of COPD but has a long-standing smoking history and so this seems likely; she is also on Dulera and Anoro at home, further indicating probable h/o COPD -She does not have fever or leukocytosis - but was started on Levaquin yesterday by her PCP Chest x-ray is not consistent with pneumonia -Nebulizers: scheduled Duoneb and prn albuterol -Solu-Medrol 80 mg IV BID  -Continue PO Levaquin  HTN -Takes low-dose Coreg and Avapro with Aldactone at home -Continue Coreg but hold the others for now  HLD -Continue Lipitor -Check FLP  Hyperglycemia -Will check A1c -There is no indication to start medication at this time  AKI on CKD -Likely related to ongoing use of ARB and diuretic in the setting of mixed acute respiratory failure -Will hold nephrotoxic medications  -Monitor with diuresis; it is possible that she will need IVF but given her severe CHF history, for now will hold IVF  Tobacco dependence -Tobacco Dependence: encourage cessation.   -This was discussed with the patient and should be reviewed on an ongoing basis.   -Patch declined   DVT prophylaxis: Lovenox  Code Status:  DNR - confirmed with patient Family Communication: None present Disposition Plan:  Home once clinically improved Consults called: CM/SW/PT/OT/Nutrition/RT  Admission status: Admit - It is my clinical opinion that admission to INPATIENT is reasonable and necessary because this patient will require at least 2 midnights in the hospital to treat this condition based on the medical complexity of the problems  presented.  Given the aforementioned information, the predictability of an adverse outcome is felt to be significant.   Jonah Blue MD [facility]   How to contact the [facility] Attending or Consulting provider 7A - 7P or covering provider during after hours 7P -7A, for this patient?  1. Check the care team in [facility] and look for a) attending/consulting [facility] provider listed and b) the [facility] team listed 2. Log into [email or web address] and use [place]'s universal password to access. If you do not have the password, please contact the hospital operator. 3. Locate the [facility] provider you are looking for under [facility] and page to a number that you can be directly reached. 4. If you still have difficulty reaching the provider, please page the [facility] (Director on Call) for the Hospitalists listed on amion for assistance.   04/25/2018, 4:01 PM

## 2018-04-25 NOTE — Progress Notes (Signed)
Placed patient on high flow cannula set at 7lpm. Will continue to monitor patient and place back on bipap with she becomes short of breath or has a desaturation.

## 2018-04-25 NOTE — ED Provider Notes (Signed)
[facility]ENCY [facility] Provider Note   CSN: [id number] Arrival date & time:        History   Chief Complaint Chief Complaint  Patient presents with  . Shortness of Breath    HPI Ashok Croonarldine Corkum is a 68 y.o. female.  Presents with complaints of shortness of breath for 4-5 days. Has had a cough and congestion. Saw her PCP yesterday, started on prednisone, abx, nebs. Reports that breathing worsened overnight. Has had orthopnea, PND and dyspnea on exertion. No fever, no chest pain.     Past Medical History:  Diagnosis Date  . Acid reflux   . Chronic systolic CHF (congestive heart failure) (HCC)   . CKD (chronic kidney disease), stage III (HCC)   . History of noncompliance with medical treatment, presenting hazards to health   . LBBB (left bundle branch block)   . Lung nodule    a. seen on prior CT, f/u due 11/2017.  . Mitral regurgitation   . NICM (nonischemic cardiomyopathy) (HCC)    a. prior hx of this, EF 20-25% in 11/2016. b. EF 30-35% by cath 03/2017 with normal coronaries.  . Tobacco abuse     Patient Active Problem List   Diagnosis Date Noted  . History of noncompliance with medical treatment, presenting hazards to health   . CKD (chronic kidney disease), stage III (HCC)   . LBBB (left bundle branch block)   . NICM (nonischemic cardiomyopathy) (HCC)   . Mitral regurgitation   . Tobacco abuse   . Lung nodule   . Acute on chronic systolic (congestive) heart failure (HCC) 03/19/2017  . Acute CHF (congestive heart failure) (HCC) 11/20/2016  . CHF (congestive heart failure) (HCC) 03/07/2016  . Protein-calorie malnutrition, severe 03/07/2016  . Abdominal pain, epigastric 10/06/2015  . Elevated transaminase level 10/06/2015  . Acute renal insufficiency 10/06/2015  . Hyperglycemia 10/06/2015  . Severe mitral regurgitation 10/06/2015  . 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 Acute on chronic systolic CHF (congestive heart  failure) (HCC) 10/05/2015    Past Surgical History:  Procedure Laterality Date  . HERNIA REPAIR    . RIGHT/LEFT HEART CATH AND CORONARY ANGIOGRAPHY N/A 03/21/2017   Procedure: RIGHT/LEFT HEART CATH AND CORONARY ANGIOGRAPHY;  Surgeon: Dolores PattyBensimhon, Daniel R, MD;  Location: [facility];  Service: Cardiovascular;  Laterality: N/A;     OB History   No obstetric history on file.      Home Medications    Prior to Admission medications   Medication Sig Start Date End Date Taking? Authorizing Provider  acetaminophen (TYLENOL) 500 MG tablet Take 1,000 mg by mouth daily as needed for mild pain.   Yes [provider]  albuterol (PROVENTIL HFA;VENTOLIN HFA) 108 (90 Base) MCG/ACT inhaler Inhale 1-2 puffs into the lungs every 6 (six) hours as needed for wheezing or shortness of breath. 01/28/17  Yes Caccavale, Sophia, PA-C  aspirin EC 81 MG EC tablet Take 1 tablet (81 mg total) by mouth daily. 03/12/16  Yes Gouru, Deanna ArtisAruna, MD  atorvastatin (LIPITOR) 20 MG tablet Take 20 mg by mouth daily.   Yes [provider]  budesonide-formoterol (SYMBICORT) 160-4.5 MCG/ACT inhaler Inhale 2 puffs into the lungs 2 (two) times daily as needed (SOB).   Yes [provider]  carvedilol (COREG) 6.25 MG tablet Take 1 tablet (6.25 mg total) by mouth 2 (two) times daily with a meal. 12/12/17  Yes Crenshaw, Madolyn FriezeBrian S, MD  fluticasone (FLONASE) 50 MCG/ACT nasal spray Place 1 spray  into both nostrils daily. 01/28/17  Yes Caccavale, Sophia, PA-C  furosemide (LASIX) 40 MG tablet TAKE 1 TABLET BY MOUTH DAILY. Patient taking differently: Take 40 mg by mouth daily.  09/22/17  Yes Jodelle Gross, NP  Investigational - Study Medication Study name: GALACTIC Study - Placebo / omecamtiv mecarbil tablet Take 1 tablet by mouth twice daily 03/25/17  Yes Dunn, Dayna N, PA-C  irbesartan (AVAPRO) 75 MG tablet TAKE 1 TABLET BY MOUTH DAILY. Patient taking differently: Take 75 mg by mouth daily.  07/16/17  Yes  Jodelle Gross, NP  levofloxacin (LEVAQUIN) 750 MG tablet Take 750 mg by mouth daily.   Yes [provider]  pantoprazole (PROTONIX) 40 MG tablet Take 1 tablet (40 mg total) by mouth daily. 03/11/16  Yes Gouru, Deanna Artis, MD  spironolactone (ALDACTONE) 25 MG tablet Take 1 tablet (25 mg total) by mouth daily. 12/12/17  Yes Lewayne Bunting, MD  umeclidinium-vilanterol (ANORO ELLIPTA) 62.5-25 MCG/INH AEPB Inhale 1 puff into the lungs daily as needed (SOB).   Yes [provider]    Family History Family History  Problem Relation Age of Onset  . Hypertension Mother   . Heart attack Father     Social History Social History   Tobacco Use  . Smoking status: Current Some Day Smoker    Packs/day: 0.00    Types: Cigarettes  . Smokeless tobacco: Never Used  . Tobacco comment: Pt reports a pack will last her about a month  Substance Use Topics  . Alcohol use: No  . Drug use: No     Allergies   Patient has no known allergies.   Review of Systems Review of Systems  Respiratory: Positive for cough and shortness of breath.   All other systems reviewed and are negative.    Physical Exam Updated Vital Signs BP 113/60   Pulse 81   Temp (!) 96.4 F (35.8 C) (Temporal)   Resp (!) 22   Ht 6\' 1"  (1.854 m)   Wt 85.7 kg   SpO2 99%   BMI 24.94 kg/m   Physical Exam Vitals signs and nursing note reviewed.  Constitutional:      General: She is not in acute distress.    Appearance: Normal appearance. She is well-developed.  HENT:     Head: Normocephalic and atraumatic.     Right Ear: Hearing normal.     Left Ear: Hearing normal.     Nose: Nose normal.  Eyes:     Conjunctiva/sclera: Conjunctivae normal.     Pupils: Pupils are equal, round, and reactive to light.  Neck:     Musculoskeletal: Normal range of motion and neck supple.  Cardiovascular:     Rate and Rhythm: Regular rhythm.     Heart sounds: S1 normal and S2 normal. No murmur. No friction rub. No  gallop.   Pulmonary:     Effort: Pulmonary effort is normal. No respiratory distress.     Breath sounds: Decreased air movement present. Rales present.  Chest:     Chest wall: No tenderness.  Abdominal:     General: Bowel sounds are normal.     Palpations: Abdomen is soft.     Tenderness: There is no abdominal tenderness. There is no guarding or rebound. Negative signs include Murphy's sign and McBurney's sign.     Hernia: No hernia is present.  Musculoskeletal: Normal range of motion.  Skin:    General: Skin is warm and dry.     Findings: No  rash.  Neurological:     Mental Status: She is alert and oriented to person, place, and time.     GCS: GCS eye subscore is 4. GCS verbal subscore is 5. GCS motor subscore is 6.     Cranial Nerves: No cranial nerve deficit.     Sensory: No sensory deficit.     Coordination: Coordination normal.  Psychiatric:        Speech: Speech normal.        Behavior: Behavior normal.        Thought Content: Thought content normal.      ED Treatments / Results  Labs (all labs ordered are listed, but only abnormal results are displayed) Labs Reviewed  CBC WITH DIFFERENTIAL/PLATELET - Abnormal; Notable for the following components:      Result Value   Hemoglobin 11.8 (*)    Platelets 415 (*)    All other components within normal limits  BASIC METABOLIC PANEL - Abnormal; Notable for the following components:   Sodium 133 (*)    CO2 20 (*)    Glucose, Bld 114 (*)    Creatinine, Ser 1.67 (*)    GFR calc non Af Amer 32 (*)    GFR calc Af Amer 37 (*)    All other components within normal limits  BRAIN NATRIURETIC PEPTIDE - Abnormal; Notable for the following components:   B Natriuretic Peptide 526.4 (*)    All other components within normal limits  POCT I-STAT 7, (LYTES, BLD GAS, ICA,H+H) - Abnormal; Notable for the following components:   pH, Arterial 7.559 (*)    pCO2 arterial 26.4 (*)    pO2, Arterial 204.0 (*)    HCT 35.0 (*)    Hemoglobin  11.9 (*)    All other components within normal limits  TROPONIN I  I-STAT TROPONIN, ED  I-STAT ARTERIAL BLOOD GAS, ED    EKG EKG Interpretation  Date/Time:  Saturday April 25 2018 06:03:21 EST Ventricular Rate:  82 PR Interval:    QRS Duration: 160 QT Interval:  455 QTC Calculation: 532 R Axis:   34 Text Interpretation:  Sinus rhythm Left bundle branch block No significant change since last tracing Confirmed by Gilda Crease ([id number]) on 04/25/2018 6:19:51 AM   Radiology Dg Chest Port 1 View  Result Date: 04/25/2018 CLINICAL DATA:  Shortness of breath EXAM: PORTABLE CHEST 1 VIEW COMPARISON:  March 19, 2017 FINDINGS: There is stable cardiomegaly with pulmonary vascularity within normal limits. There is nodular interstitial prominence, stable. There is no consolidation or pleural effusion. There is aortic atherosclerosis. No adenopathy. There is an old healed fracture of the left lateral sixth rib. IMPRESSION: Stable cardiomegaly. Mild nodular interstitial prominence throughout the lungs, stable. Question mild chronic interstitial edema versus underlying interstitial lung disease. No new opacity evident. No consolidation in particular. Aortic Atherosclerosis (ICD10-I70.0). Electronically Signed   By: Bretta Bang III M.D.   On: 04/25/2018 07:03    Procedures Procedures (including critical care time)  Medications Ordered in ED Medications  furosemide (LASIX) injection 40 mg (has no administration in time range)  methylPREDNISolone sodium succinate (SOLU-MEDROL) 125 mg/2 mL injection 125 mg (has no administration in time range)  ipratropium-albuterol (DUONEB) 0.5-2.5 (3) MG/3ML nebulizer solution 3 mL (3 mLs Nebulization Given 04/25/18 0838)  albuterol (PROVENTIL) (2.5 MG/3ML) 0.083% nebulizer solution 2.5 mg (2.5 mg Nebulization Given 04/25/18 6606)     Initial Impression / Assessment and Plan / ED Course  I have reviewed the triage vital signs and  the nursing  notes.  Pertinent labs & imaging results that were available during my care of the patient were reviewed by me and considered in my medical decision making (see chart for details).     Patient presents to the emergency department for evaluation of shortness of breath.  Patient has a known history of systolic heart failure.  She has been taking her Lasix as prescribed.  Over the last for 5 days she has developed a cough.  She saw her primary doctor and was started on treatment for acute bronchitis.  She reports no improvement.  EMS report that she had wheezing initially but has been administered albuterol during transport with improvement.  She also was given nitroglycerin for the concern of possible volume overload.  Her work-up has been largely unrevealing.  Chest x-ray does not show pneumonia or any significant volume overload.  Lab work was otherwise at her baseline.  Blood gas does not show hypoxia or CO2 retention.  Patient administered IV Lasix for additional diuresis.  Was brought in on CPAP, initially placed on BiPAP and weaned to nasal cannula O2.  I have taken her off of the oxygen and she is remaining 100% on room air.  She still has some wheezing.  Will re-dose nebulizer treatment and recheck.  Sign out to oncoming ER physician who will evaluate patient after nebs and allow her to ambulate to ensure she does not desat.  If she maintains her oxygen, can be discharged with continued outpatient treatment.  Final Clinical Impressions(s) / ED Diagnoses   Final diagnoses:  Acute bronchitis, unspecified organism    ED Discharge Orders    None       Gilda Crease, MD 04/25/18 [phone]

## 2018-04-25 NOTE — ED Triage Notes (Addendum)
Pt arrived via EMS from home with c/o worsening shob onset Tuesday, pt seen by PCP Friday, pt states she is compliant with Lasix but has been unable to lay down to rest. Wheezing noted by EMS, pt placed on bipap by EMS, continued on arrival.

## 2018-04-25 NOTE — ED Notes (Signed)
Pt moved into rm 33--

## 2018-04-26 ENCOUNTER — Other Ambulatory Visit: Payer: Self-pay

## 2018-04-26 DIAGNOSIS — I428 Other cardiomyopathies: Secondary | ICD-10-CM | POA: Diagnosis present

## 2018-04-26 DIAGNOSIS — K219 Gastro-esophageal reflux disease without esophagitis: Secondary | ICD-10-CM | POA: Diagnosis present

## 2018-04-26 DIAGNOSIS — Z66 Do not resuscitate: Secondary | ICD-10-CM | POA: Diagnosis present

## 2018-04-26 DIAGNOSIS — N179 Acute kidney failure, unspecified: Secondary | ICD-10-CM | POA: Diagnosis present

## 2018-04-26 DIAGNOSIS — Z79899 Other long term (current) drug therapy: Secondary | ICD-10-CM | POA: Diagnosis not present

## 2018-04-26 DIAGNOSIS — J441 Chronic obstructive pulmonary disease with (acute) exacerbation: Secondary | ICD-10-CM | POA: Diagnosis present

## 2018-04-26 DIAGNOSIS — F17210 Nicotine dependence, cigarettes, uncomplicated: Secondary | ICD-10-CM | POA: Diagnosis present

## 2018-04-26 DIAGNOSIS — J9601 Acute respiratory failure with hypoxia: Secondary | ICD-10-CM | POA: Diagnosis present

## 2018-04-26 DIAGNOSIS — Z7951 Long term (current) use of inhaled steroids: Secondary | ICD-10-CM | POA: Diagnosis not present

## 2018-04-26 DIAGNOSIS — E785 Hyperlipidemia, unspecified: Secondary | ICD-10-CM | POA: Diagnosis present

## 2018-04-26 DIAGNOSIS — J209 Acute bronchitis, unspecified: Secondary | ICD-10-CM | POA: Diagnosis present

## 2018-04-26 DIAGNOSIS — Z87440 Personal history of urinary (tract) infections: Secondary | ICD-10-CM | POA: Diagnosis not present

## 2018-04-26 DIAGNOSIS — R739 Hyperglycemia, unspecified: Secondary | ICD-10-CM | POA: Diagnosis present

## 2018-04-26 DIAGNOSIS — Z8249 Family history of ischemic heart disease and other diseases of the circulatory system: Secondary | ICD-10-CM | POA: Diagnosis not present

## 2018-04-26 DIAGNOSIS — J440 Chronic obstructive pulmonary disease with acute lower respiratory infection: Secondary | ICD-10-CM | POA: Diagnosis present

## 2018-04-26 DIAGNOSIS — Z7982 Long term (current) use of aspirin: Secondary | ICD-10-CM | POA: Diagnosis not present

## 2018-04-26 DIAGNOSIS — R0602 Shortness of breath: Secondary | ICD-10-CM | POA: Diagnosis present

## 2018-04-26 DIAGNOSIS — R0603 Acute respiratory distress: Secondary | ICD-10-CM | POA: Diagnosis not present

## 2018-04-26 DIAGNOSIS — I447 Left bundle-branch block, unspecified: Secondary | ICD-10-CM | POA: Diagnosis present

## 2018-04-26 DIAGNOSIS — N183 Chronic kidney disease, stage 3 (moderate): Secondary | ICD-10-CM | POA: Diagnosis present

## 2018-04-26 DIAGNOSIS — I5023 Acute on chronic systolic (congestive) heart failure: Secondary | ICD-10-CM | POA: Diagnosis present

## 2018-04-26 DIAGNOSIS — R911 Solitary pulmonary nodule: Secondary | ICD-10-CM | POA: Diagnosis present

## 2018-04-26 DIAGNOSIS — I340 Nonrheumatic mitral (valve) insufficiency: Secondary | ICD-10-CM | POA: Diagnosis present

## 2018-04-26 DIAGNOSIS — I130 Hypertensive heart and chronic kidney disease with heart failure and stage 1 through stage 4 chronic kidney disease, or unspecified chronic kidney disease: Secondary | ICD-10-CM | POA: Diagnosis present

## 2018-04-26 DIAGNOSIS — Z9119 Patient's noncompliance with other medical treatment and regimen: Secondary | ICD-10-CM | POA: Diagnosis not present

## 2018-04-26 HISTORY — DX: Acute respiratory failure with hypoxia: J96.01

## 2018-04-26 LAB — CBC WITH DIFFERENTIAL/PLATELET
Abs Immature Granulocytes: 0.02 10*3/uL (ref 0.00–0.07)
Basophils Absolute: 0 10*3/uL (ref 0.0–0.1)
Basophils Relative: 0 %
Eosinophils Absolute: 0 10*3/uL (ref 0.0–0.5)
Eosinophils Relative: 0 %
HCT: 35.8 % — ABNORMAL LOW (ref 36.0–46.0)
Hemoglobin: 11.1 g/dL — ABNORMAL LOW (ref 12.0–15.0)
Immature Granulocytes: 0 %
Lymphocytes Relative: 13 %
Lymphs Abs: 0.8 10*3/uL (ref 0.7–4.0)
MCH: 27.7 pg (ref 26.0–34.0)
MCHC: 31 g/dL (ref 30.0–36.0)
MCV: 89.3 fL (ref 80.0–100.0)
Monocytes Absolute: 0.2 10*3/uL (ref 0.1–1.0)
Monocytes Relative: 3 %
NEUTROS ABS: 5 10*3/uL (ref 1.7–7.7)
Neutrophils Relative %: 84 %
Platelets: 393 10*3/uL (ref 150–400)
RBC: 4.01 MIL/uL (ref 3.87–5.11)
RDW: 13.6 % (ref 11.5–15.5)
WBC: 6 10*3/uL (ref 4.0–10.5)
nRBC: 0 % (ref 0.0–0.2)

## 2018-04-26 LAB — BASIC METABOLIC PANEL
Anion gap: 14 (ref 5–15)
BUN: 38 mg/dL — AB (ref 8–23)
CO2: 19 mmol/L — ABNORMAL LOW (ref 22–32)
CREATININE: 1.89 mg/dL — AB (ref 0.44–1.00)
Calcium: 9.2 mg/dL (ref 8.9–10.3)
Chloride: 98 mmol/L (ref 98–111)
GFR calc Af Amer: 31 mL/min — ABNORMAL LOW (ref >60)
GFR calc non Af Amer: 27 mL/min — ABNORMAL LOW (ref >60)
Glucose, Bld: 198 mg/dL — ABNORMAL HIGH (ref 70–99)
Potassium: 4.8 mmol/L (ref 3.5–5.1)
Sodium: 131 mmol/L — ABNORMAL LOW (ref 135–145)

## 2018-04-26 LAB — RESPIRATORY PANEL BY PCR
Adenovirus: NOT DETECTED
Bordetella pertussis: NOT DETECTED
CORONAVIRUS OC43-RVPPCR: NOT DETECTED
Chlamydophila pneumoniae: NOT DETECTED
Coronavirus 229E: NOT DETECTED
Coronavirus HKU1: NOT DETECTED
Coronavirus NL63: NOT DETECTED
Influenza A: NOT DETECTED
Influenza B: NOT DETECTED
METAPNEUMOVIRUS-RVPPCR: NOT DETECTED
Mycoplasma pneumoniae: NOT DETECTED
PARAINFLUENZA VIRUS 3-RVPPCR: NOT DETECTED
Parainfluenza Virus 1: NOT DETECTED
Parainfluenza Virus 2: NOT DETECTED
Parainfluenza Virus 4: NOT DETECTED
RHINOVIRUS / ENTEROVIRUS - RVPPCR: NOT DETECTED
Respiratory Syncytial Virus: NOT DETECTED

## 2018-04-26 LAB — HIV ANTIBODY (ROUTINE TESTING W REFLEX): HIV Screen 4th Generation wRfx: NONREACTIVE

## 2018-04-26 LAB — LIPID PANEL
Cholesterol: 188 mg/dL (ref 0–200)
HDL: 41 mg/dL (ref >40)
LDL Cholesterol: 128 mg/dL — ABNORMAL HIGH (ref 0–99)
Total CHOL/HDL Ratio: 4.6 RATIO
Triglycerides: 94 mg/dL (ref <150)
VLDL: 19 mg/dL (ref 0–40)

## 2018-04-26 MED ORDER — ZOLPIDEM TARTRATE 5 MG PO TABS
5.0000 mg | ORAL_TABLET | Freq: Once | ORAL | Status: AC
  Administered 2018-04-26: 5 mg via ORAL
  Filled 2018-04-26: qty 1

## 2018-04-26 NOTE — Progress Notes (Signed)
PROGRESS NOTE    Barbara Oliver  XBM:[id number] DOB: 1950-11-23 DOA: 04/25/2018 PCP: Virl Axe, FNP     Brief Narrative:  Barbara Oliver is a 68 y.o. female with medical history significant of NICM with systolic heart failure EF 30%; LBBB; and stage 3 CKD presenting with SOB.  She reports similar symptoms to when she had initial CHF diagnosis about a year ago.  Symptoms started Tuesday and she hasn't been able to sleep since.  She can't sleep, can't lie flat.  She coughs even while sitting up.  Can't eat because she coughs so hard she vomits.  Cough is productive of grayish-greenish looking phlegm.  Has been feeling feverish.    She initially required BiPAP in the emergency department.  She was admitted for acute hypoxemic respiratory failure.  New events last 24 hours / Subjective: Currently off BiPAP and on room air.  She states that her breathing has greatly improved since admission.  Denies any peripheral edema.  Continues to have a cough.  Wheezing has improved.  Assessment & Plan:   Principal Problem:   Respiratory distress Active Problems:   Acute renal insufficiency   Hyperglycemia   Acute on chronic systolic (congestive) heart failure (HCC)   CKD (chronic kidney disease), stage III (HCC)   Tobacco abuse   COPD with acute bronchitis (HCC)   Acute hypoxemic respiratory failure - Now off BiPAP and is on room air  COPD exacerbation with acute bronchitis - Patient with significant wheezing on admission, has longstanding smoking history - Continue IV Solu-Medrol, patient has continued expiratory wheezes on examination - Patient started on Levaquin in the emergency department - Respiratory panel PCR negative - Continue nebulizer treatments  Previous history of chronic systolic congestive heart failure - She was previously seen by Dr. Jens Som in the office, no cardiology follow-up currently.  She was enrolled in [place] heart failure trial  - Echocardiogram was repeated  yesterday, EF 60-65%, which is greatly improved since her echocardiogram in September 2018 which revealed EF 20 to 25% - BNP 526  Hypertension - Continue Coreg - Hold Avapro, Aldactone due to AKI  AKI on CKD stage 3 - Baseline creatinine 1.1 - Hold Avapro, Aldactone, Lasix - Hold off on any further IV fluids due to history of NICM and systolic heart failure - Trend BMP  Hyperlipidemia - Continue Lipitor  Tobacco dependence - Encourage cessation  DVT prophylaxis: Lovenox Code Status: DNR Family Communication: No family at bedside Disposition Plan: Pending further improvement in respiratory status, wheezing   Consultants:   None  Procedures:   None  Antimicrobials:  Anti-infectives (From admission, onward)   Start     Dose/Rate Route Frequency Ordered Stop   04/25/18 1430  levofloxacin (LEVAQUIN) tablet 750 mg     750 mg Oral Every 48 hours 04/25/18 1259         Objective: Vitals:   04/26/18 0242 04/26/18 0347 04/26/18 0806 04/26/18 0815  BP:  119/70  (!) 128/52  Pulse:  76 74 77  Resp:  17 18 19   Temp:  98.1 F (36.7 C)  97.8 F (36.6 C)  TempSrc:  Oral    SpO2: 99% 95%  98%  Weight:      Height:       No intake or output data in the 24 hours ending 04/26/18 1241 Filed Weights   04/25/18 0619  Weight: 85.7 kg    Examination:  General exam: Appears calm and comfortable  Respiratory system: Bilateral wheezes, no respiratory  distress on room air Cardiovascular system: S1 & S2 heard, RRR. No JVD, murmurs, rubs, gallops or clicks. No pedal edema. Gastrointestinal system: Abdomen is nondistended, soft and nontender. No organomegaly or masses felt. Normal bowel sounds heard. Central nervous system: Alert and oriented. No focal neurological deficits. Extremities: Symmetric 5 x 5 power. Skin: No rashes, lesions or ulcers Psychiatry: Judgement and insight appear normal. Mood & affect appropriate.   Data Reviewed: I have personally reviewed following labs  and imaging studies  CBC: Recent Labs  Lab 04/25/18 0605 04/25/18 0612 04/26/18 0245  WBC 8.2  --  6.0  NEUTROABS 5.3  --  5.0  HGB 11.8* 11.9* 11.1*  HCT 37.9 35.0* 35.8*  MCV 89.4  --  89.3  PLT 415*  --  393   Basic Metabolic Panel: Recent Labs  Lab 04/25/18 0605 04/25/18 0612 04/26/18 0245  NA 133* 135 131*  K 4.5 4.4 4.8  CL 100  --  98  CO2 20*  --  19*  GLUCOSE 114*  --  198*  BUN 23  --  38*  CREATININE 1.67*  --  1.89*  CALCIUM 9.7  --  9.2   GFR: Estimated Creatinine Clearance: 34.9 mL/min (A) (by C-G formula based on SCr of 1.89 mg/dL (H)). Liver Function Tests: No results for input(s): AST, ALT, ALKPHOS, BILITOT, PROT, ALBUMIN in the last 168 hours. No results for input(s): LIPASE, AMYLASE in the last 168 hours. No results for input(s): AMMONIA in the last 168 hours. Coagulation Profile: No results for input(s): INR, PROTIME in the last 168 hours. Cardiac Enzymes: Recent Labs  Lab 04/25/18 0605  TROPONINI <0.03   BNP (last 3 results) No results for input(s): PROBNP in the last 8760 hours. HbA1C: No results for input(s): HGBA1C in the last 72 hours. CBG: No results for input(s): GLUCAP in the last 168 hours. Lipid Profile: Recent Labs    04/26/18 0245  CHOL 188  HDL 41  LDLCALC 128*  TRIG 94  CHOLHDL 4.6   Thyroid Function Tests: No results for input(s): TSH, T4TOTAL, FREET4, T3FREE, THYROIDAB in the last 72 hours. Anemia Panel: No results for input(s): VITAMINB12, FOLATE, FERRITIN, TIBC, IRON, RETICCTPCT in the last 72 hours. Sepsis Labs: No results for input(s): PROCALCITON, LATICACIDVEN in the last 168 hours.  Recent Results (from the past 240 hour(s))  MRSA PCR Screening     Status: None   Collection Time: 04/25/18  2:44 PM  Result Value Ref Range Status   MRSA by PCR NEGATIVE NEGATIVE Final    Comment:        The GeneXpert MRSA Assay (FDA approved for NASAL specimens only), is one component of a comprehensive MRSA  colonization surveillance program. It is not intended to diagnose MRSA infection nor to guide or monitor treatment for MRSA infections. Performed at [facility], [address], [place], [place] [address]   Respiratory Panel by PCR     Status: None   Collection Time: 04/25/18  4:25 PM  Result Value Ref Range Status   Adenovirus NOT DETECTED NOT DETECTED Final   Coronavirus 229E NOT DETECTED NOT DETECTED Final    Comment: (NOTE) The Coronavirus on the Respiratory Panel, DOES NOT test for the novel  Coronavirus (2019 nCoV)    Coronavirus HKU1 NOT DETECTED NOT DETECTED Final   Coronavirus NL63 NOT DETECTED NOT DETECTED Final   Coronavirus OC43 NOT DETECTED NOT DETECTED Final   Metapneumovirus NOT DETECTED NOT DETECTED Final   Rhinovirus /  Enterovirus NOT DETECTED NOT DETECTED Final   Influenza A NOT DETECTED NOT DETECTED Final   Influenza B NOT DETECTED NOT DETECTED Final   Parainfluenza Virus 1 NOT DETECTED NOT DETECTED Final   Parainfluenza Virus 2 NOT DETECTED NOT DETECTED Final   Parainfluenza Virus 3 NOT DETECTED NOT DETECTED Final   Parainfluenza Virus 4 NOT DETECTED NOT DETECTED Final   Respiratory Syncytial Virus NOT DETECTED NOT DETECTED Final   Bordetella pertussis NOT DETECTED NOT DETECTED Final   Chlamydophila pneumoniae NOT DETECTED NOT DETECTED Final   Mycoplasma pneumoniae NOT DETECTED NOT DETECTED Final    Comment: Performed at [facility], [address], [place], [place] [address]       Radiology Studies: Dg Chest Port 1 View  Result Date: 04/25/2018 CLINICAL DATA:  Shortness of breath EXAM: PORTABLE CHEST 1 VIEW COMPARISON:  March 19, 2017 FINDINGS: There is stable cardiomegaly with pulmonary vascularity within normal limits. There is nodular interstitial prominence, stable. There is no consolidation or pleural effusion. There is aortic atherosclerosis. No adenopathy. There is an old healed fracture of the left lateral sixth rib. IMPRESSION:  Stable cardiomegaly. Mild nodular interstitial prominence throughout the lungs, stable. Question mild chronic interstitial edema versus underlying interstitial lung disease. No new opacity evident. No consolidation in particular. Aortic Atherosclerosis (ICD10-I70.0). Electronically Signed   By: Bretta Bang III M.D.   On: 04/25/2018 07:03      Scheduled Meds: . aspirin EC  81 mg Oral Daily  . atorvastatin  20 mg Oral Daily  . carvedilol  6.25 mg Oral BID WC  . enoxaparin (LOVENOX) injection  40 mg Subcutaneous Q24H  . fluticasone  1 spray Each Nare Daily  . ipratropium-albuterol  3 mL Nebulization Q6H  . levofloxacin  750 mg Oral Q48H  . methylPREDNISolone (SOLU-MEDROL) injection  80 mg Intravenous Q12H  . mometasone-formoterol  2 puff Inhalation BID  . pantoprazole  40 mg Oral Daily  . sodium chloride flush  3 mL Intravenous Q12H   Continuous Infusions: . sodium chloride       LOS: 0 days    Time spent: 40 minutes   Noralee Stain, DO [facility] [email or web address] 04/26/2018, 12:41 PM

## 2018-04-26 NOTE — Care Management Obs Status (Signed)
MEDICARE OBSERVATION STATUS NOTIFICATION   Patient Details  Name: Catalyna Halleck MRN: [id number] Date of Birth: [birth year]   Medicare Observation Status Notification Given:  Yes    Lawerance Sabal, RN 04/26/2018, 9:52 AM

## 2018-04-26 NOTE — Care Management Note (Signed)
Case Management Note  Patient Details  Name: Barbara Oliver MRN: [id number] Date of Birth: 07-Mar-1951  Subjective/Objective:                    Action/Plan:  Patient lives at home with brother and roommate. She states someone is always available to help her. She gets meds filled at [facility], PCP is at [facility] [address] -[place], [place], [place]. Patient drives. She also uses transport from [place]. She is covered through [facility] and Medicaid.   Expected Discharge Date:                  Expected Discharge Plan:     In-House Referral:     Discharge planning Services  CM Consult  Post Acute Care Choice:    Choice offered to:     DME Arranged:    DME Agency:     HH Arranged:    HH Agency:     Status of Service:  In process, will continue to follow  If discussed at Long Length of Stay Meetings, dates discussed:    Additional Comments:  Lawerance Sabal, RN 04/26/2018, 9:53 AM

## 2018-04-27 LAB — BASIC METABOLIC PANEL
Anion gap: 11 (ref 5–15)
BUN: 33 mg/dL — AB (ref 8–23)
CO2: 22 mmol/L (ref 22–32)
Calcium: 9.5 mg/dL (ref 8.9–10.3)
Chloride: 100 mmol/L (ref 98–111)
Creatinine, Ser: 1.35 mg/dL — ABNORMAL HIGH (ref 0.44–1.00)
GFR calc Af Amer: 47 mL/min — ABNORMAL LOW (ref >60)
GFR calc non Af Amer: 41 mL/min — ABNORMAL LOW (ref >60)
Glucose, Bld: 181 mg/dL — ABNORMAL HIGH (ref 70–99)
POTASSIUM: 4.4 mmol/L (ref 3.5–5.1)
Sodium: 133 mmol/L — ABNORMAL LOW (ref 135–145)

## 2018-04-27 LAB — HEMOGLOBIN A1C
Hgb A1c MFr Bld: 5.3 % (ref 4.8–5.6)
Mean Plasma Glucose: 105 mg/dL

## 2018-04-27 MED ORDER — IPRATROPIUM-ALBUTEROL 0.5-2.5 (3) MG/3ML IN SOLN
3.0000 mL | Freq: Three times a day (TID) | RESPIRATORY_TRACT | Status: DC
  Administered 2018-04-27 – 2018-04-28 (×4): 3 mL via RESPIRATORY_TRACT
  Filled 2018-04-27 (×5): qty 3

## 2018-04-27 MED ORDER — METHYLPREDNISOLONE SODIUM SUCC 40 MG IJ SOLR
40.0000 mg | Freq: Two times a day (BID) | INTRAMUSCULAR | Status: DC
  Administered 2018-04-27 – 2018-04-28 (×2): 40 mg via INTRAVENOUS
  Filled 2018-04-27 (×2): qty 1

## 2018-04-27 NOTE — Progress Notes (Signed)
PROGRESS NOTE    Barbara Oliver  UYQ:[id number] DOB: [birth year] DOA: 04/25/2018 PCP: Virl Axe, FNP     Brief Narrative:  Barbara Oliver is a 68 y.o. female with medical history significant of NICM with systolic heart failure EF 30%; LBBB; and stage 3 CKD presenting with SOB.  She reports similar symptoms to when she had initial CHF diagnosis about a year ago.  Symptoms started Tuesday and she hasn't been able to sleep since.  She can't sleep, can't lie flat.  She coughs even while sitting up.  Can't eat because she coughs so hard she vomits.  Cough is productive of grayish-greenish looking phlegm.  Has been feeling feverish.    She initially required BiPAP in the emergency department.  She was admitted for acute hypoxemic respiratory failure.  New events last 24 hours / Subjective: On room air this morning.  She states that when she got up to go to the bathroom, her breathing became more labored, associated with dizziness.  Improved with rest.  Assessment & Plan:   Principal Problem:   Respiratory distress Active Problems:   Acute renal insufficiency   Hyperglycemia   Acute on chronic systolic (congestive) heart failure (HCC)   CKD (chronic kidney disease), stage III (HCC)   Tobacco abuse   COPD with acute bronchitis (HCC)   Acute respiratory failure with hypoxia (HCC)   Acute hypoxemic respiratory failure - Now off BiPAP and is on room air  COPD exacerbation with acute bronchitis - Patient with significant wheezing on admission, has longstanding smoking history - Continue IV Solu-Medrol, patient has continued expiratory wheezes on examination this morning - Patient started on Levaquin in the emergency department - Respiratory panel PCR negative - Continue nebulizer treatments  Previous history of chronic systolic congestive heart failure - She was previously seen by Dr. Jens Som in the office, no cardiology follow-up currently.  She was enrolled in [place] heart failure  trial  - Echocardiogram was repeated yesterday, EF 60-65%, which is greatly improved since her echocardiogram in September 2018 which revealed EF 20 to 25% - BNP 526  Hypertension - Continue Coreg - Hold Avapro, Aldactone due to AKI  AKI on CKD stage 3 - Baseline creatinine 1.1 - Hold Avapro, Aldactone, Lasix - Hold off on any further IV fluids due to history of NICM and systolic heart failure - Trend BMP, creatinine improved this morning 1.35  Hyperlipidemia - Continue Lipitor  Tobacco dependence - Encourage cessation  DVT prophylaxis: Lovenox Code Status: DNR Family Communication: No family at bedside Disposition Plan: Pending further improvement in respiratory status, wheezing   Consultants:   None  Procedures:   None  Antimicrobials:  Anti-infectives (From admission, onward)   Start     Dose/Rate Route Frequency Ordered Stop   04/25/18 1430  levofloxacin (LEVAQUIN) tablet 750 mg     750 mg Oral Every 48 hours 04/25/18 1259         Objective: Vitals:   04/27/18 0300 04/27/18 0755 04/27/18 0803 04/27/18 0836  BP: 130/72   125/68  Pulse: 77   80  Resp: (!) 24   19  Temp: 97.6 F (36.4 C)   98 F (36.7 C)  TempSrc: Oral   Oral  SpO2: 96% 98% 100% 100%  Weight: 83.8 kg     Height:        Intake/Output Summary (Last 24 hours) at 04/27/2018 1201 Last data filed at 04/26/2018 1900 Gross per 24 hour  Intake 480 ml  Output 500 ml  Net -20 ml   Filed Weights   04/25/18 0619 04/27/18 0300  Weight: 85.7 kg 83.8 kg    Examination: General exam: Appears calm and comfortable  Respiratory system: Wheezing bilaterally, diminished breath sounds, no distress on room air Cardiovascular system: S1 & S2 heard, RRR. No JVD, murmurs, rubs, gallops or clicks. No pedal edema. Gastrointestinal system: Abdomen is nondistended, soft and nontender. No organomegaly or masses felt. Normal bowel sounds heard. Central nervous system: Alert and oriented. No focal neurological  deficits. Extremities: Symmetric 5 x 5 power. Skin: No rashes, lesions or ulcers Psychiatry: Judgement and insight appear normal. Mood & affect appropriate.    Data Reviewed: I have personally reviewed following labs and imaging studies  CBC: Recent Labs  Lab 04/25/18 0605 04/25/18 0612 04/26/18 0245  WBC 8.2  --  6.0  NEUTROABS 5.3  --  5.0  HGB 11.8* 11.9* 11.1*  HCT 37.9 35.0* 35.8*  MCV 89.4  --  89.3  PLT 415*  --  393   Basic Metabolic Panel: Recent Labs  Lab 04/25/18 0605 04/25/18 0612 04/26/18 0245 04/27/18 0355  NA 133* 135 131* 133*  K 4.5 4.4 4.8 4.4  CL 100  --  98 100  CO2 20*  --  19* 22  GLUCOSE 114*  --  198* 181*  BUN 23  --  38* 33*  CREATININE 1.67*  --  1.89* 1.35*  CALCIUM 9.7  --  9.2 9.5   GFR: Estimated Creatinine Clearance: 48.8 mL/min (A) (by C-G formula based on SCr of 1.35 mg/dL (H)). Liver Function Tests: No results for input(s): AST, ALT, ALKPHOS, BILITOT, PROT, ALBUMIN in the last 168 hours. No results for input(s): LIPASE, AMYLASE in the last 168 hours. No results for input(s): AMMONIA in the last 168 hours. Coagulation Profile: No results for input(s): INR, PROTIME in the last 168 hours. Cardiac Enzymes: Recent Labs  Lab 04/25/18 0605  TROPONINI <0.03   BNP (last 3 results) No results for input(s): PROBNP in the last 8760 hours. HbA1C: Recent Labs    04/25/18 1652  HGBA1C 5.3   CBG: No results for input(s): GLUCAP in the last 168 hours. Lipid Profile: Recent Labs    04/26/18 0245  CHOL 188  HDL 41  LDLCALC 128*  TRIG 94  CHOLHDL 4.6   Thyroid Function Tests: No results for input(s): TSH, T4TOTAL, FREET4, T3FREE, THYROIDAB in the last 72 hours. Anemia Panel: No results for input(s): VITAMINB12, FOLATE, FERRITIN, TIBC, IRON, RETICCTPCT in the last 72 hours. Sepsis Labs: No results for input(s): PROCALCITON, LATICACIDVEN in the last 168 hours.  Recent Results (from the past 240 hour(s))  MRSA PCR Screening      Status: None   Collection Time: 04/25/18  2:44 PM  Result Value Ref Range Status   MRSA by PCR NEGATIVE NEGATIVE Final    Comment:        The GeneXpert MRSA Assay (FDA approved for NASAL specimens only), is one component of a comprehensive MRSA colonization surveillance program. It is not intended to diagnose MRSA infection nor to guide or monitor treatment for MRSA infections. Performed at [facility], [address], [place], [place] [address]   Respiratory Panel by PCR     Status: None   Collection Time: 04/25/18  4:25 PM  Result Value Ref Range Status   Adenovirus NOT DETECTED NOT DETECTED Final   Coronavirus 229E NOT DETECTED NOT DETECTED Final    Comment: (NOTE) The Coronavirus on the Respiratory  Panel, DOES NOT test for the novel  Coronavirus (2019 nCoV)    Coronavirus HKU1 NOT DETECTED NOT DETECTED Final   Coronavirus NL63 NOT DETECTED NOT DETECTED Final   Coronavirus OC43 NOT DETECTED NOT DETECTED Final   Metapneumovirus NOT DETECTED NOT DETECTED Final   Rhinovirus / Enterovirus NOT DETECTED NOT DETECTED Final   Influenza A NOT DETECTED NOT DETECTED Final   Influenza B NOT DETECTED NOT DETECTED Final   Parainfluenza Virus 1 NOT DETECTED NOT DETECTED Final   Parainfluenza Virus 2 NOT DETECTED NOT DETECTED Final   Parainfluenza Virus 3 NOT DETECTED NOT DETECTED Final   Parainfluenza Virus 4 NOT DETECTED NOT DETECTED Final   Respiratory Syncytial Virus NOT DETECTED NOT DETECTED Final   Bordetella pertussis NOT DETECTED NOT DETECTED Final   Chlamydophila pneumoniae NOT DETECTED NOT DETECTED Final   Mycoplasma pneumoniae NOT DETECTED NOT DETECTED Final    Comment: Performed at [facility], [address], [place], [place] [address]       Radiology Studies: No results found.    Scheduled Meds: . aspirin EC  81 mg Oral Daily  . atorvastatin  20 mg Oral Daily  . carvedilol  6.25 mg Oral BID WC  . enoxaparin (LOVENOX) injection  40 mg Subcutaneous  Q24H  . fluticasone  1 spray Each Nare Daily  . ipratropium-albuterol  3 mL Nebulization TID  . levofloxacin  750 mg Oral Q48H  . methylPREDNISolone (SOLU-MEDROL) injection  40 mg Intravenous Q12H  . mometasone-formoterol  2 puff Inhalation BID  . pantoprazole  40 mg Oral Daily  . sodium chloride flush  3 mL Intravenous Q12H   Continuous Infusions: . sodium chloride       LOS: 1 day    Time spent: 25 minutes   Noralee Stain, DO [facility] [email or web address] 04/27/2018, 12:01 PM

## 2018-04-27 NOTE — Progress Notes (Signed)
Nutrition Consult/Brief Note  RD consulted via COPD Focused Protocol.  Wt Readings from Last 15 Encounters:  04/27/18 83.8 kg  03/31/18 85.9 kg  02/23/18 82 kg  12/01/17 78 kg  09/01/17 78.7 kg  06/16/17 74.8 kg  05/19/17 73.4 kg  05/05/17 73.8 kg  04/21/17 74 kg  04/07/17 70.3 kg  04/02/17 71.9 kg  03/25/17 68.7 kg  01/28/17 71.7 kg  12/12/16 70.3 kg  11/23/16 70.3 kg    Body mass index is 24.37 kg/m. Patient meets criteria for Normal based on current BMI.   Current diet order is Heart Healthy, patient is consuming approximately 100% of meals at this time. Labs and medications reviewed.   No nutrition interventions warranted at this time. If nutrition issues arise, please consult RD.   Maureen Chatters, RD, LDN Pager #: [phone] After-Hours Pager #: [phone]

## 2018-04-27 NOTE — Plan of Care (Signed)
  Problem: Education: Goal: Knowledge of General Education information will improve Description Including pain rating scale, medication(s)/side effects and non-pharmacologic comfort measures Outcome: Progressing   Problem: Health Behavior/Discharge Planning: Goal: Ability to manage health-related needs will improve Outcome: Progressing   Problem: Clinical Measurements: Goal: Respiratory complications will improve Outcome: Progressing   Problem: Activity: Goal: Risk for activity intolerance will decrease Outcome: Progressing   Problem: Nutrition: Goal: Adequate nutrition will be maintained Outcome: Progressing   Problem: Coping: Goal: Level of anxiety will decrease Outcome: Progressing   

## 2018-04-28 LAB — BASIC METABOLIC PANEL
Anion gap: 8 (ref 5–15)
BUN: 26 mg/dL — ABNORMAL HIGH (ref 8–23)
CO2: 22 mmol/L (ref 22–32)
Calcium: 9.2 mg/dL (ref 8.9–10.3)
Chloride: 102 mmol/L (ref 98–111)
Creatinine, Ser: 1.11 mg/dL — ABNORMAL HIGH (ref 0.44–1.00)
GFR calc Af Amer: 60 mL/min — ABNORMAL LOW (ref >60)
GFR, EST NON AFRICAN AMERICAN: 52 mL/min — AB (ref >60)
GLUCOSE: 154 mg/dL — AB (ref 70–99)
Potassium: 4.2 mmol/L (ref 3.5–5.1)
Sodium: 132 mmol/L — ABNORMAL LOW (ref 135–145)

## 2018-04-28 MED ORDER — LEVOFLOXACIN 750 MG PO TABS: 750.0000 mg | ORAL_TABLET | ORAL | 0 refills | Status: AC

## 2018-04-28 MED ORDER — PREDNISONE 10 MG PO TABS: ORAL_TABLET | ORAL | 0 refills | Status: DC

## 2018-04-28 NOTE — Discharge Summary (Signed)
Physician Discharge Summary  Barbara Oliver NGE:[id number] DOB: 1950/07/28 DOA: 04/25/2018  PCP: Virl Axe, FNP  Admit date: 04/25/2018 Discharge date: 04/28/2018  Admitted From: Home Disposition:  Home  Recommendations for Outpatient Follow-up:  1. Follow up with PCP in 1 week 2. Follow up with Cardiology  Discharge Condition: Stable CODE STATUS: DNR   Diet recommendation: Heart healthy   Brief/Interim Summary: Barbara Boweis a 68 y.o.femalewith medical history significant ofNICM with systolic heart failure EF 30%; LBBB; and stage 3 CKD presenting with SOB.She reports similar symptoms to when she had initial CHF diagnosis about a year ago. Symptoms started Tuesday and she hasn't been able to sleep since. She can't sleep, can't lie flat. She coughs even while sitting up. Can't eat because she coughs so hard she vomits.  Cough is productive of grayish-greenish looking phlegm. Has been feeling feverish.  She initially required BiPAP in the emergency department.  She was admitted for acute hypoxemic respiratory failure.  Discharge Diagnoses:  Acute hypoxemic respiratory failure - Now off BiPAP and is on room air  COPD exacerbation with acute bronchitis - Patient with significant wheezing on admission, has longstanding smoking history - Patient started on Levaquin in the emergency department - Respiratory panel PCR negative - Discharge home with prednisone taper, levaquin   Previous history of chronic systolic congestive heart failure - She was previously seen by Dr. Jens Som, no cardiology follow-up currently.  She was enrolled in [place] heart failure trial  - Echocardiogram was repeated yesterday, EF 60-65%, which is greatly improved since her echocardiogram in September 2018 which revealed EF 20 to 25% - BNP 526  Hypertension - Continue Coreg - Held Avapro, Aldactone due to AKI, resume on discharge   AKI on CKD stage 3 - Baseline creatinine 1.1 - Hold  Avapro, Aldactone, Lasix - Hold off on any further IV fluids due to history of NICM and systolic heart failure - Trend BMP, creatinine improved this morning 1.1  Hyperlipidemia - Continue Lipitor  Tobacco dependence - Encourage cessation  Discharge Instructions  Discharge Instructions    (HEART FAILURE PATIENTS) Call MD:  Anytime you have any of the following symptoms: 1) 3 pound weight gain in 24 hours or 5 pounds in 1 week 2) shortness of breath, with or without a dry hacking cough 3) swelling in the hands, feet or stomach 4) if you have to sleep on extra pillows at night in order to breathe.   Complete by:  As directed    Call MD for:  difficulty breathing, headache or visual disturbances   Complete by:  As directed    Call MD for:  extreme fatigue   Complete by:  As directed    Call MD for:  persistant dizziness or light-headedness   Complete by:  As directed    Call MD for:  persistant nausea and vomiting   Complete by:  As directed    Call MD for:  severe uncontrolled pain   Complete by:  As directed    Call MD for:  temperature >100.4   Complete by:  As directed    Diet - low sodium heart healthy   Complete by:  As directed    Discharge instructions   Complete by:  As directed    You were cared for by a hospitalist during your hospital stay. If you have any questions about your discharge medications or the care you received while you were in the hospital after you are discharged, you can call the unit  and ask to speak with the hospitalist on call if the hospitalist that took care of you is not available. Once you are discharged, your primary care physician will handle any further medical issues. Please note that NO REFILLS for any discharge medications will be authorized once you are discharged, as it is imperative that you return to your primary care physician (or establish a relationship with a primary care physician if you do not have one) for your aftercare needs so that  they can reassess your need for medications and monitor your lab values.   Increase activity slowly   Complete by:  As directed      Allergies as of 04/28/2018   No Known Allergies     Medication List    TAKE these medications   acetaminophen 500 MG tablet Commonly known as:  TYLENOL Take 1,000 mg by mouth daily as needed for mild pain.   albuterol 108 (90 Base) MCG/ACT inhaler Commonly known as:  PROVENTIL HFA;VENTOLIN HFA Inhale 1-2 puffs into the lungs every 6 (six) hours as needed for wheezing or shortness of breath.   ANORO ELLIPTA 62.5-25 MCG/INH Aepb Generic drug:  umeclidinium-vilanterol Inhale 1 puff into the lungs daily as needed (SOB).   aspirin 81 MG EC tablet Take 1 tablet (81 mg total) by mouth daily.   atorvastatin 20 MG tablet Commonly known as:  LIPITOR Take 20 mg by mouth daily.   budesonide-formoterol 160-4.5 MCG/ACT inhaler Commonly known as:  SYMBICORT Inhale 2 puffs into the lungs 2 (two) times daily as needed (SOB).   carvedilol 6.25 MG tablet Commonly known as:  COREG Take 1 tablet (6.25 mg total) by mouth 2 (two) times daily with a meal.   fluticasone 50 MCG/ACT nasal spray Commonly known as:  FLONASE Place 1 spray into both nostrils daily.   furosemide 40 MG tablet Commonly known as:  LASIX TAKE 1 TABLET BY MOUTH DAILY.   Investigational - Study Medication Study name: GALACTIC Study - Placebo / omecamtiv mecarbil tablet Take 1 tablet by mouth twice daily   irbesartan 75 MG tablet Commonly known as:  AVAPRO TAKE 1 TABLET BY MOUTH DAILY.   levofloxacin 750 MG tablet Commonly known as:  LEVAQUIN Take 1 tablet (750 mg total) by mouth every other day for 2 days. Start taking on:  April 29, 2018 What changed:  when to take this   pantoprazole 40 MG tablet Commonly known as:  PROTONIX Take 1 tablet (40 mg total) by mouth daily.   predniSONE 10 MG tablet Commonly known as:  DELTASONE Take 4 tabs for 3 days, then 3 tabs for 3 days,  then 2 tabs for 3 days, then 1 tab for 3 days, then 1/2 tab for 4 days.   spironolactone 25 MG tablet Commonly known as:  ALDACTONE Take 1 tablet (25 mg total) by mouth daily.       No Known Allergies  Consultations:  None    Procedures/Studies: Dg Chest Port 1 View  Result Date: 04/25/2018 CLINICAL DATA:  Shortness of breath EXAM: PORTABLE CHEST 1 VIEW COMPARISON:  March 19, 2017 FINDINGS: There is stable cardiomegaly with pulmonary vascularity within normal limits. There is nodular interstitial prominence, stable. There is no consolidation or pleural effusion. There is aortic atherosclerosis. No adenopathy. There is an old healed fracture of the left lateral sixth rib. IMPRESSION: Stable cardiomegaly. Mild nodular interstitial prominence throughout the lungs, stable. Question mild chronic interstitial edema versus underlying interstitial lung disease. No new opacity evident. No  consolidation in particular. Aortic Atherosclerosis (ICD10-I70.0). Electronically Signed   By: Bretta BangWilliam  Woodruff III M.D.   On: 04/25/2018 07:03    Echo  IMPRESSIONS    1. The left ventricle has normal systolic function with an ejection fraction of 60-65%. The cavity size was normal. There is moderately increased left ventricular wall thickness. Left ventricular diastolic Doppler parameters are indeterminate due to  nondiagnostic images. Elevated left ventricular end-diastolic pressure The E/e' is 09.814.3. No evidence of left ventricular regional wall motion abnormalities.  2. The right ventricle has normal systolic function. The cavity was normal. There is no increase in right ventricular wall thickness.  3. The mitral valve is normal in structure. There is mild mitral annular calcification present.  4. The tricuspid valve is normal in structure.  5. The aortic valve is tricuspid Mild sclerosis of the aortic valve.  6. The pulmonic valve was normal in structure.  7. No evidence of left ventricular regional  wall motion abnormalities.  8. Right atrial pressure is estimated at 3 mmHg.   Discharge Exam: Vitals:   04/27/18 2258 04/28/18 0740  BP: 114/66 (!) 133/94  Pulse: 88 78  Resp: 12   Temp: 98.1 F (36.7 C) 97.6 F (36.4 C)  SpO2: 99% 100%    General: Pt is alert, awake, not in acute distress Cardiovascular: RRR, S1/S2 +, no rubs, no gallops Respiratory: CTA bilaterally, no wheezing, no rhonchi Abdominal: Soft, NT, ND, bowel sounds + Extremities: no edema, no cyanosis    The results of significant diagnostics from this hospitalization (including imaging, microbiology, ancillary and laboratory) are listed below for reference.     Microbiology: Recent Results (from the past 240 hour(s))  MRSA PCR Screening     Status: None   Collection Time: 04/25/18  2:44 PM  Result Value Ref Range Status   MRSA by PCR NEGATIVE NEGATIVE Final    Comment:        The GeneXpert MRSA Assay (FDA approved for NASAL specimens only), is one component of a comprehensive MRSA colonization surveillance program. It is not intended to diagnose MRSA infection nor to guide or monitor treatment for MRSA infections. Performed at [facility], [address] [place], [place] [address]   Respiratory Panel by PCR     Status: None   Collection Time: 04/25/18  4:25 PM  Result Value Ref Range Status   Adenovirus NOT DETECTED NOT DETECTED Final   Coronavirus 229E NOT DETECTED NOT DETECTED Final    Comment: (NOTE) The Coronavirus on the Respiratory Panel, DOES NOT test for the novel  Coronavirus (2019 nCoV)    Coronavirus HKU1 NOT DETECTED NOT DETECTED Final   Coronavirus NL63 NOT DETECTED NOT DETECTED Final   Coronavirus OC43 NOT DETECTED NOT DETECTED Final   Metapneumovirus NOT DETECTED NOT DETECTED Final   Rhinovirus / Enterovirus NOT DETECTED NOT DETECTED Final   Influenza A NOT DETECTED NOT DETECTED Final   Influenza B NOT DETECTED NOT DETECTED Final   Parainfluenza Virus 1 NOT DETECTED  NOT DETECTED Final   Parainfluenza Virus 2 NOT DETECTED NOT DETECTED Final   Parainfluenza Virus 3 NOT DETECTED NOT DETECTED Final   Parainfluenza Virus 4 NOT DETECTED NOT DETECTED Final   Respiratory Syncytial Virus NOT DETECTED NOT DETECTED Final   Bordetella pertussis NOT DETECTED NOT DETECTED Final   Chlamydophila pneumoniae NOT DETECTED NOT DETECTED Final   Mycoplasma pneumoniae NOT DETECTED NOT DETECTED Final    Comment: Performed at [facility], [address], [place], [place] [address]     Labs: BNP (last 3 results) Recent Labs    04/25/18 0605  BNP 526.4*   Basic Metabolic Panel: Recent Labs  Lab 04/25/18 0605 04/25/18 0612 04/26/18 0245 04/27/18 0355 04/28/18 0314  NA 133* 135 131* 133* 132*  K 4.5 4.4 4.8 4.4 4.2  CL 100  --  98 100 102  CO2 20*  --  19* 22 22  GLUCOSE 114*  --  198* 181* 154*  BUN 23  --  38* 33* 26*  CREATININE 1.67*  --  1.89* 1.35* 1.11*  CALCIUM 9.7  --  9.2 9.5 9.2   Liver Function Tests: No results for input(s): AST, ALT, ALKPHOS, BILITOT, PROT, ALBUMIN in the last 168 hours. No results for input(s): LIPASE, AMYLASE in the last 168 hours. No results for input(s): AMMONIA in the last 168 hours. CBC: Recent Labs  Lab 04/25/18 0605 04/25/18 0612 04/26/18 0245  WBC 8.2  --  6.0  NEUTROABS 5.3  --  5.0  HGB 11.8* 11.9* 11.1*  HCT 37.9 35.0* 35.8*  MCV 89.4  --  89.3  PLT 415*  --  393   Cardiac Enzymes: Recent Labs  Lab 04/25/18 0605  TROPONINI <0.03   BNP: Invalid input(s): POCBNP CBG: No results for input(s): GLUCAP in the last 168 hours. D-Dimer No results for input(s): DDIMER in the last 72 hours. Hgb A1c Recent Labs    04/25/18 1652  HGBA1C 5.3   Lipid Profile Recent Labs    04/26/18 0245  CHOL 188  HDL 41  LDLCALC 128*  TRIG 94  CHOLHDL 4.6   Thyroid function studies No results for input(s): TSH, T4TOTAL, T3FREE, THYROIDAB in the last 72 hours.  Invalid input(s): FREET3 Anemia work up No  results for input(s): VITAMINB12, FOLATE, FERRITIN, TIBC, IRON, RETICCTPCT in the last 72 hours. Urinalysis    Component Value Date/Time   COLORURINE YELLOW 03/19/2017 1902   APPEARANCEUR CLEAR 03/19/2017 1902   APPEARANCEUR Clear 09/30/2013 1021   LABSPEC 1.016 03/19/2017 1902   LABSPEC 1.006 09/30/2013 1021   PHURINE 6.0 03/19/2017 1902   GLUCOSEU NEGATIVE 03/19/2017 1902   GLUCOSEU Negative 09/30/2013 1021   HGBUR NEGATIVE 03/19/2017 1902   BILIRUBINUR NEGATIVE 03/19/2017 1902   BILIRUBINUR Negative 09/30/2013 1021   KETONESUR NEGATIVE 03/19/2017 1902   PROTEINUR NEGATIVE 03/19/2017 1902   NITRITE NEGATIVE 03/19/2017 1902   LEUKOCYTESUR NEGATIVE 03/19/2017 1902   LEUKOCYTESUR Negative 09/30/2013 1021   Sepsis Labs Invalid input(s): PROCALCITONIN,  WBC,  LACTICIDVEN Microbiology Recent Results (from the past 240 hour(s))  MRSA PCR Screening     Status: None   Collection Time: 04/25/18  2:44 PM  Result Value Ref Range Status   MRSA by PCR NEGATIVE NEGATIVE Final    Comment:        The GeneXpert MRSA Assay (FDA approved for NASAL specimens only), is one component of a comprehensive MRSA colonization surveillance program. It is not intended to diagnose MRSA infection nor to guide or monitor treatment for MRSA infections. Performed at [facility], [address] [place], [place] [address]   Respiratory Panel by PCR     Status: None   Collection Time: 04/25/18  4:25 PM  Result Value Ref Range Status   Adenovirus NOT DETECTED NOT DETECTED Final   Coronavirus 229E NOT DETECTED NOT DETECTED Final    Comment: (NOTE) The Coronavirus on the Respiratory Panel, DOES NOT test for the novel  Coronavirus (2019 nCoV)  Coronavirus HKU1 NOT DETECTED NOT DETECTED Final   Coronavirus NL63 NOT DETECTED NOT DETECTED Final   Coronavirus OC43 NOT DETECTED NOT DETECTED Final   Metapneumovirus NOT DETECTED NOT DETECTED Final   Rhinovirus / Enterovirus NOT DETECTED NOT DETECTED  Final   Influenza A NOT DETECTED NOT DETECTED Final   Influenza B NOT DETECTED NOT DETECTED Final   Parainfluenza Virus 1 NOT DETECTED NOT DETECTED Final   Parainfluenza Virus 2 NOT DETECTED NOT DETECTED Final   Parainfluenza Virus 3 NOT DETECTED NOT DETECTED Final   Parainfluenza Virus 4 NOT DETECTED NOT DETECTED Final   Respiratory Syncytial Virus NOT DETECTED NOT DETECTED Final   Bordetella pertussis NOT DETECTED NOT DETECTED Final   Chlamydophila pneumoniae NOT DETECTED NOT DETECTED Final   Mycoplasma pneumoniae NOT DETECTED NOT DETECTED Final    Comment: Performed at [facility], [address] [place], [place] [address]     Patient was seen and examined on the day of discharge and was found to be in stable condition. Time coordinating discharge: 25 minutes including assessment and coordination of care, as well as examination of the patient.   SIGNED:  Noralee Stain, DO [facility] [email or web address] 04/28/2018, 9:46 AM

## 2018-04-28 NOTE — Plan of Care (Signed)
  Problem: Education: Goal: Knowledge of General Education information will improve Description Including pain rating scale, medication(s)/side effects and non-pharmacologic comfort measures Outcome: Progressing   Problem: Clinical Measurements: Goal: Ability to maintain clinical measurements within normal limits will improve Outcome: Progressing   Problem: Clinical Measurements: Goal: Respiratory complications will improve Outcome: Progressing   Problem: Nutrition: Goal: Adequate nutrition will be maintained Outcome: Progressing

## 2018-05-06 NOTE — Progress Notes (Signed)
HPI: Follow-up nonischemic cardiomyopathy and congestive heart failure.  Previous echocardiogram September 2018 showed ejection fraction 20 to 25%, mild aortic insufficiency, moderate mitral regurgitation, mild biatrial enlargement and mild right ventricular enlargement.  Admitted January 2019 with acute on chronic CHF.  Cardiac catheterization revealed normal coronary arteries and ejection fraction 30 to 35%, pulmonary capillary wedge pressure 10.  Treated medically. Echocardiogram February 2020 showed normal LV function, moderate left ventricular hypertrophy.  Patient also with history of lung nodule and follow-up recommended September 2019.  Since last seen, she denies dyspnea on exertion, orthopnea, PND, pedal edema, chest pain or syncope.  Current Outpatient Medications  Medication Sig Dispense Refill  . acetaminophen (TYLENOL) 500 MG tablet Take 1,000 mg by mouth daily as needed for mild pain.    Marland Kitchen albuterol (PROVENTIL HFA;VENTOLIN HFA) 108 (90 Base) MCG/ACT inhaler Inhale 1-2 puffs into the lungs every 6 (six) hours as needed for wheezing or shortness of breath. 1 Inhaler 0  . aspirin EC 81 MG EC tablet Take 1 tablet (81 mg total) by mouth daily.    Marland Kitchen atorvastatin (LIPITOR) 20 MG tablet Take 20 mg by mouth daily.    . budesonide-formoterol (SYMBICORT) 160-4.5 MCG/ACT inhaler Inhale 2 puffs into the lungs 2 (two) times daily as needed (SOB).    . carvedilol (COREG) 6.25 MG tablet Take 1 tablet (6.25 mg total) by mouth 2 (two) times daily with a meal. 180 tablet 1  . fluticasone (FLONASE) 50 MCG/ACT nasal spray Place 1 spray into both nostrils daily. 16 g 0  . furosemide (LASIX) 40 MG tablet TAKE 1 TABLET BY MOUTH DAILY. (Patient taking differently: Take 40 mg by mouth daily. ) 30 tablet 6  . Investigational - Study Medication Study name: GALACTIC Study - Placebo / omecamtiv mecarbil tablet Take 1 tablet by mouth twice daily 1 each PRN  . irbesartan (AVAPRO) 75 MG tablet TAKE 1 TABLET BY  MOUTH DAILY. (Patient taking differently: Take 75 mg by mouth daily. ) 30 tablet 8  . pantoprazole (PROTONIX) 40 MG tablet Take 1 tablet (40 mg total) by mouth daily. 30 tablet 0  . predniSONE (DELTASONE) 10 MG tablet Take 4 tabs for 3 days, then 3 tabs for 3 days, then 2 tabs for 3 days, then 1 tab for 3 days, then 1/2 tab for 4 days. 32 tablet 0  . spironolactone (ALDACTONE) 25 MG tablet Take 1 tablet (25 mg total) by mouth daily. 90 tablet 1  . umeclidinium-vilanterol (ANORO ELLIPTA) 62.5-25 MCG/INH AEPB Inhale 1 puff into the lungs daily as needed (SOB).     No current facility-administered medications for this visit.      Past Medical History:  Diagnosis Date  . Abdominal pain, epigastric 10/06/2015  . Acid reflux   . Acute on chronic systolic (congestive) heart failure (HCC) 03/19/2017  . Acute renal insufficiency 10/06/2015  . Acute respiratory failure with hypoxia (HCC) 04/26/2018  . Chronic systolic CHF (congestive heart failure) (HCC)   . CKD (chronic kidney disease), stage III (HCC)   . 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 COPD with acute bronchitis (HCC) 04/25/2018  . Elevated transaminase level 10/06/2015  . History of noncompliance with medical treatment, presenting hazards to health   . Hyperglycemia 10/06/2015  . LBBB (left bundle branch block)   . Lung nodule    a. seen on prior CT, f/u due 11/2017.  . Mitral regurgitation   . NICM (nonischemic cardiomyopathy) (HCC)    a. prior hx  of this, EF 20-25% in 11/2016. b. EF 30-35% by cath 03/2017 with normal coronaries.  . Protein-calorie malnutrition, severe 03/07/2016  . Respiratory distress 04/25/2018  . Severe mitral regurgitation 10/06/2015  . Tobacco abuse     Past Surgical History:  Procedure Laterality Date  . HERNIA REPAIR    . RIGHT/LEFT HEART CATH AND CORONARY ANGIOGRAPHY N/A 03/21/2017   Procedure: RIGHT/LEFT HEART CATH AND CORONARY ANGIOGRAPHY;  Surgeon: Dolores Patty, MD;  Location: [facility] INVASIVE  [facility] LAB;  Service: Cardiovascular;  Laterality: N/A;    Social History   Socioeconomic History  . Marital status: Married    Spouse name: Not on file  . Number of children: Not on file  . Years of education: Not on file  . Highest education level: Not on file  Occupational History  . Occupation: retired  Engineer, production  . Financial resource strain: Not on file  . Food insecurity:    Worry: Not on file    Inability: Not on file  . Transportation needs:    Medical: Not on file    Non-medical: Not on file  Tobacco Use  . Smoking status: Current Every Day Smoker    Packs/day: 0.50    Years: 50.00    Pack years: 25.00    Types: Cigarettes  . Smokeless tobacco: Never Used  . Tobacco comment: Pt reports a pack will last her about a month  Substance and Sexual Activity  . Alcohol use: No  . Drug use: No  . Sexual activity: Not on file  Lifestyle  . Physical activity:    Days per week: Not on file    Minutes per session: Not on file  . Stress: Not on file  Relationships  . Social connections:    Talks on phone: Not on file    Gets together: Not on file    Attends religious service: Not on file    Active member of club or organization: Not on file    Attends meetings of clubs or organizations: Not on file    Relationship status: Not on file  . Intimate partner violence:    Fear of current or ex partner: Not on file    Emotionally abused: Not on file    Physically abused: Not on file    Forced sexual activity: Not on file  Other Topics Concern  . Not on file  Social History Narrative  . Not on file    Family History  Problem Relation Age of Onset  . Hypertension Mother   . Heart attack Father     ROS: no fevers or chills, productive cough, hemoptysis, dysphasia, odynophagia, melena, hematochezia, dysuria, hematuria, rash, seizure activity, orthopnea, PND, pedal edema, claudication. Remaining systems are negative.  Physical Exam: Well-developed well-nourished in no  acute distress.  Skin is warm and dry.  HEENT is normal.  Neck is supple.  Chest is clear to auscultation with normal expansion.  Cardiovascular exam is regular rate and rhythm.  Abdominal exam nontender or distended. No masses palpated. Extremities show no edema. neuro grossly intact  A/P  1 chronic diastolic congestive heart failure-patient is euvolemic on examination today.  Continue present dose of Lasix and spironolactone.  Continue fluid restriction and low-sodium diet.    2 nonischemic cardiomyopathy-continue ARB and beta-blocker.  LV function improved on most recent echocardiogram.  3 tobacco abuse-patient has discontinued.  4 history of moderate mitral regurgitation-improved on most recent echocardiogram.  5 lung nodule-plan follow-up noncontrast chest CT.  Arlys John  Stanford Breed, MD

## 2018-05-07 DIAGNOSIS — Z09 Encounter for follow-up examination after completed treatment for conditions other than malignant neoplasm: Secondary | ICD-10-CM | POA: Diagnosis not present

## 2018-05-07 DIAGNOSIS — I509 Heart failure, unspecified: Secondary | ICD-10-CM | POA: Diagnosis not present

## 2018-05-07 DIAGNOSIS — R6889 Other general symptoms and signs: Secondary | ICD-10-CM | POA: Diagnosis not present

## 2018-05-07 DIAGNOSIS — R0602 Shortness of breath: Secondary | ICD-10-CM | POA: Diagnosis not present

## 2018-05-07 DIAGNOSIS — J40 Bronchitis, not specified as acute or chronic: Secondary | ICD-10-CM | POA: Diagnosis not present

## 2018-05-11 ENCOUNTER — Encounter: Payer: Self-pay | Admitting: Cardiology

## 2018-05-11 ENCOUNTER — Ambulatory Visit (INDEPENDENT_AMBULATORY_CARE_PROVIDER_SITE_OTHER): Payer: Medicare HMO | Admitting: Cardiology

## 2018-05-11 VITALS — BP 122/68 | HR 86 | Ht 73.0 in | Wt 186.0 lb

## 2018-05-11 DIAGNOSIS — I428 Other cardiomyopathies: Secondary | ICD-10-CM

## 2018-05-11 DIAGNOSIS — R6889 Other general symptoms and signs: Secondary | ICD-10-CM | POA: Diagnosis not present

## 2018-05-11 DIAGNOSIS — I5032 Chronic diastolic (congestive) heart failure: Secondary | ICD-10-CM | POA: Diagnosis not present

## 2018-05-11 DIAGNOSIS — R918 Other nonspecific abnormal finding of lung field: Secondary | ICD-10-CM | POA: Diagnosis not present

## 2018-05-11 NOTE — Patient Instructions (Signed)
Medication Instructions:  NO CHANGE If you need a refill on your cardiac medications before your next appointment, please call your pharmacy.   Lab work: If you have labs (blood work) drawn today and your tests are completely normal, you will receive your results only by: Marland Kitchen MyChart Message (if you have MyChart) OR . A paper copy in the mail If you have any lab test that is abnormal or we need to change your treatment, we will call you to review the results.  Testing/Procedures: CT WO TO FOLLOW UP LUNG NODULE=[address]  Follow-Up: At [facility], you and your health needs are our priority.  As part of our continuing mission to provide you with exceptional heart care, we have created designated Provider Care Teams.  These Care Teams include your primary Cardiologist (physician) and Advanced Practice Providers (APPs -  Physician Assistants and Nurse Practitioners) who all work together to provide you with the care you need, when you need it. You will need a follow up appointment in 6 months.  Please call our office 2 months in advance to schedule this appointment.  You may see Olga Millers MD or one of the following Advanced Practice Providers on your designated Care Team:   Corine Shelter, PA-C Judy Pimple, [place] . Marjie Skiff, PA-C

## 2018-05-15 DIAGNOSIS — R5383 Other fatigue: Secondary | ICD-10-CM | POA: Diagnosis not present

## 2018-05-15 DIAGNOSIS — Z0000 Encounter for general adult medical examination without abnormal findings: Secondary | ICD-10-CM | POA: Diagnosis not present

## 2018-05-15 DIAGNOSIS — R6889 Other general symptoms and signs: Secondary | ICD-10-CM | POA: Diagnosis not present

## 2018-05-15 DIAGNOSIS — R0602 Shortness of breath: Secondary | ICD-10-CM | POA: Diagnosis not present

## 2018-05-15 DIAGNOSIS — E1165 Type 2 diabetes mellitus with hyperglycemia: Secondary | ICD-10-CM | POA: Diagnosis not present

## 2018-05-15 DIAGNOSIS — F17200 Nicotine dependence, unspecified, uncomplicated: Secondary | ICD-10-CM | POA: Diagnosis not present

## 2018-05-15 DIAGNOSIS — Z87891 Personal history of nicotine dependence: Secondary | ICD-10-CM | POA: Diagnosis not present

## 2018-05-15 DIAGNOSIS — E559 Vitamin D deficiency, unspecified: Secondary | ICD-10-CM | POA: Diagnosis not present

## 2018-05-19 ENCOUNTER — Other Ambulatory Visit: Payer: Self-pay | Admitting: Family Medicine

## 2018-05-19 DIAGNOSIS — Z1231 Encounter for screening mammogram for malignant neoplasm of breast: Secondary | ICD-10-CM

## 2018-05-19 DIAGNOSIS — E2839 Other primary ovarian failure: Secondary | ICD-10-CM

## 2018-05-20 DIAGNOSIS — R9431 Abnormal electrocardiogram [ECG] [EKG]: Secondary | ICD-10-CM | POA: Diagnosis not present

## 2018-05-20 DIAGNOSIS — I509 Heart failure, unspecified: Secondary | ICD-10-CM | POA: Diagnosis not present

## 2018-05-20 DIAGNOSIS — R6889 Other general symptoms and signs: Secondary | ICD-10-CM | POA: Diagnosis not present

## 2018-05-20 DIAGNOSIS — R0602 Shortness of breath: Secondary | ICD-10-CM | POA: Diagnosis not present

## 2018-05-26 ENCOUNTER — Other Ambulatory Visit: Payer: Medicare HMO

## 2018-05-27 ENCOUNTER — Inpatient Hospital Stay: Admission: RE | Admit: 2018-05-27 | Payer: Medicare HMO | Source: Ambulatory Visit

## 2018-06-03 DIAGNOSIS — Z6825 Body mass index (BMI) 25.0-25.9, adult: Secondary | ICD-10-CM | POA: Diagnosis not present

## 2018-06-03 DIAGNOSIS — E7800 Pure hypercholesterolemia, unspecified: Secondary | ICD-10-CM | POA: Diagnosis not present

## 2018-06-03 DIAGNOSIS — R6889 Other general symptoms and signs: Secondary | ICD-10-CM | POA: Diagnosis not present

## 2018-06-03 DIAGNOSIS — R062 Wheezing: Secondary | ICD-10-CM | POA: Diagnosis not present

## 2018-06-03 DIAGNOSIS — F17200 Nicotine dependence, unspecified, uncomplicated: Secondary | ICD-10-CM | POA: Diagnosis not present

## 2018-06-03 DIAGNOSIS — R350 Frequency of micturition: Secondary | ICD-10-CM | POA: Diagnosis not present

## 2018-06-03 DIAGNOSIS — D539 Nutritional anemia, unspecified: Secondary | ICD-10-CM | POA: Diagnosis not present

## 2018-06-03 DIAGNOSIS — Z87891 Personal history of nicotine dependence: Secondary | ICD-10-CM | POA: Diagnosis not present

## 2018-06-03 DIAGNOSIS — R002 Palpitations: Secondary | ICD-10-CM | POA: Diagnosis not present

## 2018-06-11 DIAGNOSIS — R6889 Other general symptoms and signs: Secondary | ICD-10-CM | POA: Diagnosis not present

## 2018-06-16 DIAGNOSIS — R6889 Other general symptoms and signs: Secondary | ICD-10-CM | POA: Diagnosis not present

## 2018-06-16 DIAGNOSIS — Z87891 Personal history of nicotine dependence: Secondary | ICD-10-CM | POA: Diagnosis not present

## 2018-06-16 DIAGNOSIS — Z780 Asymptomatic menopausal state: Secondary | ICD-10-CM | POA: Diagnosis not present

## 2018-06-18 DIAGNOSIS — R6889 Other general symptoms and signs: Secondary | ICD-10-CM | POA: Diagnosis not present

## 2018-06-18 DIAGNOSIS — I739 Peripheral vascular disease, unspecified: Secondary | ICD-10-CM | POA: Diagnosis not present

## 2018-06-18 DIAGNOSIS — R002 Palpitations: Secondary | ICD-10-CM | POA: Diagnosis not present

## 2018-06-18 DIAGNOSIS — R0602 Shortness of breath: Secondary | ICD-10-CM | POA: Diagnosis not present

## 2018-07-06 DIAGNOSIS — R6889 Other general symptoms and signs: Secondary | ICD-10-CM | POA: Diagnosis not present

## 2018-07-06 DIAGNOSIS — I739 Peripheral vascular disease, unspecified: Secondary | ICD-10-CM | POA: Diagnosis not present

## 2018-07-06 DIAGNOSIS — R002 Palpitations: Secondary | ICD-10-CM | POA: Diagnosis not present

## 2018-07-06 DIAGNOSIS — I63232 Cerebral infarction due to unspecified occlusion or stenosis of left carotid arteries: Secondary | ICD-10-CM | POA: Diagnosis not present

## 2018-07-14 ENCOUNTER — Ambulatory Visit: Payer: Medicare HMO

## 2018-07-14 ENCOUNTER — Other Ambulatory Visit: Payer: Medicare HMO

## 2018-07-27 ENCOUNTER — Other Ambulatory Visit: Payer: Self-pay | Admitting: Cardiology

## 2018-07-30 ENCOUNTER — Ambulatory Visit: Payer: Medicare HMO | Admitting: Cardiology

## 2018-08-04 ENCOUNTER — Ambulatory Visit: Payer: Medicaid Other | Admitting: Cardiovascular Disease

## 2018-08-05 ENCOUNTER — Telehealth: Payer: Medicare HMO | Admitting: Cardiovascular Disease

## 2018-08-06 ENCOUNTER — Telehealth: Payer: Self-pay | Admitting: Cardiovascular Disease

## 2018-08-06 NOTE — Telephone Encounter (Signed)
Spoke with patient who states she has been taking entresto once daily. She states the nurse she spoke with yesterday (?) was going to talk to Dr. Allyson Sabal about increasing medication to twice daily. Explained to patient that entresto is typically prescribed BID but she states Dr. Jens Som prescribed this for her. She states she spoke with Research scientist (physical sciences) routed

## 2018-08-06 NOTE — Telephone Encounter (Signed)
New Message    Barbara Oliver is calling from [facility] center and she said she faxed information 3 times and is calling to confirm it has been received   Please call back

## 2018-08-06 NOTE — Telephone Encounter (Signed)
Attempted to return call to [facility] with [facility] but no one answered and phone went to answering service. Will forward to medical records & Debra RN concerning faxed paperwork

## 2018-08-06 NOTE — Telephone Encounter (Signed)
New Message    Pt c/o medication issue:  1. Name of Medication: Entresto  2. How are you currently taking this medication (dosage and times per day)? 1x daily   3. Are you having a reaction (difficulty breathing--STAT)? No  4. What is your medication issue? Pt said the nurse was suppose to check with Dr Allyson Sabal about increasing the medication to 2x daily and sending a refill   Please call

## 2018-08-14 ENCOUNTER — Ambulatory Visit
Admission: RE | Admit: 2018-08-14 | Discharge: 2018-08-14 | Disposition: A | Discharge status: Home / Self Care | Payer: Medicare HMO | Source: Ambulatory Visit | Attending: Family Medicine | Admitting: Family Medicine

## 2018-08-14 ENCOUNTER — Other Ambulatory Visit: Payer: Self-pay

## 2018-08-14 DIAGNOSIS — Z1231 Encounter for screening mammogram for malignant neoplasm of breast: Secondary | ICD-10-CM

## 2018-08-17 ENCOUNTER — Telehealth: Payer: Self-pay | Admitting: Cardiovascular Disease

## 2018-08-17 ENCOUNTER — Other Ambulatory Visit: Payer: Self-pay | Admitting: Nurse Practitioner

## 2018-08-17 DIAGNOSIS — Z1231 Encounter for screening mammogram for malignant neoplasm of breast: Secondary | ICD-10-CM

## 2018-08-17 NOTE — Telephone Encounter (Signed)
Phone visit/call [phone]/PV my chart/pre reg complete/consent obtained -- ttf

## 2018-08-25 ENCOUNTER — Encounter: Payer: Self-pay | Admitting: Cardiovascular Disease

## 2018-08-25 ENCOUNTER — Telehealth: Payer: Self-pay

## 2018-08-25 ENCOUNTER — Telehealth (INDEPENDENT_AMBULATORY_CARE_PROVIDER_SITE_OTHER): Payer: Medicare HMO | Admitting: Cardiovascular Disease

## 2018-08-25 DIAGNOSIS — I340 Nonrheumatic mitral (valve) insufficiency: Secondary | ICD-10-CM

## 2018-08-25 DIAGNOSIS — I428 Other cardiomyopathies: Secondary | ICD-10-CM

## 2018-08-25 DIAGNOSIS — Z720 Tobacco use: Secondary | ICD-10-CM | POA: Diagnosis not present

## 2018-08-25 DIAGNOSIS — I6522 Occlusion and stenosis of left carotid artery: Secondary | ICD-10-CM

## 2018-08-25 NOTE — Patient Instructions (Addendum)
Medication Instructions:  Your physician recommends that you continue on your current medications as directed. Please refer to the Current Medication list given to you today.  If you need a refill on your cardiac medications before your next appointment, please call your pharmacy.   Lab work: NONE If you have labs (blood work) drawn today and your tests are completely normal, you will receive your results only by: Marland Kitchen MyChart Message (if you have MyChart) OR . A paper copy in the mail If you have any lab test that is abnormal or we need to change your treatment, we will call you to review the results.  Testing/Procedures: Your physician has requested that you have a carotid duplex. This test is an ultrasound of the carotid arteries in your neck. It looks at blood flow through these arteries that supply the brain with blood. Allow one hour for this exam. There are no restrictions or special instructions. TO BE SCHEDULED FOR 1-2 WEEKS FROM TODAY. YOU WILL BE CONTACTED BY A SCHEDULER TO SET UP THIS APPOINTMENT.   Follow-Up: At [facility], you and your health needs are our priority.  As part of our continuing mission to provide you with exceptional heart care, we have created designated Provider Care Teams.  These Care Teams include your primary Cardiologist (physician) and Advanced Practice Providers (APPs -  Physician Assistants and Nurse Practitioners) who all work together to provide you with the care you need, when you need it. . You will need a follow up appointment in 12 months OR LESS with Dr. Gwenlyn Found. Your follow up appointment is pending the results of your carotid artery ultrasound.  Please call our office 2 months in advance to schedule this appointment.     Any Other Special Instructions Will Be Listed Below (If Applicable). IF THE RESULTS OF YOUR CAROTID ARTERY ULTRASOUND SUGGEST HIGH-GRADE LEFT INTERNAL CAROTID ARTERY STENOSIS, YOU WILL BE REFERRED TO DR. VANCE BRABHAM FOR VASCULAR  SURGICAL CONSULT. IF THE RESULTS OF YOUR CAROTID ARTERY ULTRASOUND SHOW MODERATE STENOSIS, YOUR CAROTID ARTERY ULTRASOUND WILL BE REPEATED ON AN ANNUAL BASIS AND DR. Gwenlyn Found WILL SEE YOU BACK IN THE OFFICE IN A YEAR.

## 2018-08-25 NOTE — Telephone Encounter (Signed)
Left message stating that AVS to be sent to mailing address on file and can call back to discuss AVS instructions from 6/16 virtual visit  Letter including After Visit Summary and any other necessary documents to be mailed to the patient's address on file.

## 2018-08-25 NOTE — Telephone Encounter (Signed)
Pt had virtual visit 6/16 with Dr. Gwenlyn Found

## 2018-08-25 NOTE — Progress Notes (Signed)
Virtual Visit via Telephone Note   This visit type was conducted due to national recommendations for restrictions regarding the COVID-19 Pandemic (e.g. social distancing) in an effort to limit this patient's exposure and mitigate transmission in our community.  Due to her co-morbid illnesses, this patient is at least at moderate risk for complications without adequate follow up.  This format is felt to be most appropriate for this patient at this time.  The patient did not have access to video technology/had technical difficulties with video requiring transitioning to audio format only (telephone).  All issues noted in this document were discussed and addressed.  No physical exam could be performed with this format.  Please refer to the patient's chart for her  consent to telehealth for [facility]   Date:  08/25/2018   ID:  Barbara Oliver, DOB 09/16/1950, MRN [id number]  Patient Location: Home Provider Location: Home  PCP:  Simona Huh, NP  Cardiologist: Dr. Luiz Iron. Stanford Breed Peripheral vascular cardiologist: Dr. Quay Burow Electrophysiologist:  None   Evaluation Performed:  New Patient Evaluation  Chief Complaint: Carotid artery disease  History of Present Illness:    Barbara Oliver is a 68 y.o. separated African-American female with no children referred by Dr. Claudie Leach for peripheral vascular valuation because of a recently discovered left internal carotid artery stenosis by duplex ultrasound.  She is a cardiology patient of Dr. Jacalyn Lefevre.  She is undergone right left heart cath by Dr. Haroldine Laws 03/20/2017 revealing normal coronary arteries with an EF of 30 to 35% consistent with nonischemic cardiomyopathy.  Her ejection fraction normalized by echo 04/27/2018 with optimal medical therapy and she is no longer symptomatic.  She does have a history of treated hypertension and hyperlipidemia.  She is on atorvastatin.  She smokes sporadically and does have a family history of  heart disease with a father who had a myocardial infarction in his 94s.  She is never had a heart attack or stroke.  She denies chest pain or shortness of breath.  The patient does not have symptoms concerning for COVID-19 infection (fever, chills, cough, or new shortness of breath).    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 seen on prior CT, f/u due 11/2017.   Mitral regurgitation    NICM (nonischemic cardiomyopathy) ([place])    a. prior hx of this, EF 20-25% in 11/2016. b. EF 30-35% by cath 03/2017 with normal coronaries.   Protein-calorie malnutrition, severe 03/07/2016   Respiratory distress 04/25/2018   Severe mitral regurgitation 10/06/2015   Tobacco abuse    Past Surgical History:  Procedure Laterality Date   HERNIA REPAIR     RIGHT/LEFT HEART CATH AND CORONARY ANGIOGRAPHY N/A 03/21/2017   Procedure: RIGHT/LEFT HEART CATH AND CORONARY ANGIOGRAPHY;  Surgeon: Jolaine Artist, MD;  Location: [place] [facility];  Service: Cardiovascular;  Laterality: N/A;  Current Meds  Medication Sig   albuterol (PROVENTIL HFA;VENTOLIN HFA) 108 (90 Base) MCG/ACT inhaler Inhale 1-2 puffs into the lungs every 6 (six) hours as needed for wheezing or shortness of breath.   albuterol (PROVENTIL) (2.5 MG/3ML) 0.083% nebulizer solution    aspirin EC 81 MG EC  tablet Take 1 tablet (81 mg total) by mouth Oliver.   atorvastatin (LIPITOR) 20 MG tablet Take 20 mg by mouth Oliver.   budesonide-formoterol (SYMBICORT) 160-4.5 MCG/ACT inhaler Inhale 2 puffs into the lungs 2 (two) times Oliver as needed (SOB).   carvedilol (COREG) 12.5 MG tablet Take 12.5 mg by mouth 2 (two) times Oliver with a meal.   clopidogrel (PLAVIX) 75 MG tablet Take 1 tablet by mouth Oliver.   ENTRESTO 24-26 MG Take 1 tablet by mouth Oliver.   fluticasone (FLONASE) 50 MCG/ACT nasal spray Place 1 spray into both nostrils Oliver.   furosemide (LASIX) 40 MG tablet TAKE 1 TABLET BY MOUTH Oliver.   Investigational - Study Medication Study name: GALACTIC Study - Placebo / omecamtiv mecarbil tablet Take 1 tablet by mouth twice Oliver   pantoprazole (PROTONIX) 40 MG tablet Take 1 tablet (40 mg total) by mouth Oliver.   spironolactone (ALDACTONE) 25 MG tablet TAKE 1 TABLET BY MOUTH Oliver.   umeclidinium-vilanterol (ANORO ELLIPTA) 62.5-25 MCG/INH AEPB Inhale 1 puff into the lungs Oliver as needed (SOB).   Vitamin D, Ergocalciferol, (DRISDOL) 1.25 MG (50000 UT) CAPS capsule Take 50,000 Units by mouth every 7 (seven) days.     Allergies:   Patient has no known allergies.   Social History   Tobacco Use   Smoking status: Light Tobacco Smoker    Packs/day: 0.50    Years: 50.00    Pack years: 25.00    Types: Cigarettes   Smokeless tobacco: Never Used   Tobacco comment: Pt reports a pack will last her about a month  Substance Use Topics   Alcohol use: No   Drug use: No     Family Hx: The patient's family history includes Heart attack in her father; Hypertension in her mother.  ROS:   Please see the history of present illness.     All other systems reviewed and are negative.   Prior CV studies:   The following studies were reviewed today:  Carotid Doppler studies performed by Dr. Hanley Hays  Labs/Other Tests and Data Reviewed:    EKG:  No ECG reviewed.  Recent  Labs: 03/31/2018: ALT 21 04/25/2018: B Natriuretic Peptide 526.4 04/26/2018: Hemoglobin 11.1; Platelets 393 04/28/2018: BUN 26; Creatinine, Ser 1.11; Potassium 4.2; Sodium 132   Recent Lipid Panel Lab Results  Component Value Date/Time   CHOL 188 04/26/2018 02:45 AM   TRIG 94 04/26/2018 02:45 AM   HDL 41 04/26/2018 02:45 AM   CHOLHDL 4.6 04/26/2018 02:45 AM   LDLCALC 128 (H) 04/26/2018 02:45 AM    Wt Readings from Last 3 Encounters:  08/25/18 194 lb (88 kg)  08/05/18 194 lb (88 kg)  05/11/18 186 lb (84.4 kg)     Objective:    Vital Signs:  Ht 6\' 1"  (1.854 m)    Wt 194 lb (88 kg)    BMI 25.60 kg/m    VITAL SIGNS:  reviewed a full physical exam was not performed today since this was a virtual telemedicine phone visit  ASSESSMENT & PLAN:    1. Carotid artery disease- recently demonstrated high-grade left ICA stenosis by duplex ultrasound performed by Dr. Hanley Hays.  Patient is neurologically asymptomatic.  Will repeat carotid Doppler studies.  COVID-19 Education: The signs and symptoms of COVID-19 were discussed with the patient and how to seek care for testing (follow up with PCP or arrange E-visit).  The importance of social distancing was discussed today.  Time:   Today, I have spent 6 minutes with the patient with telehealth technology discussing the above problems.     Medication Adjustments/Labs and Tests Ordered: Current medicines are reviewed at length with the patient today.  Concerns regarding medicines are outlined above.   Tests Ordered: No orders of the defined types were placed in this encounter.   Medication Changes: No orders of the defined types were placed in this encounter.   Follow Up:  In Person in 1 year(s)  Signed, Nanetta BattyJonathan Montravious Weigelt, MD  08/25/2018 11:49 AM    [facility]

## 2018-08-25 NOTE — Telephone Encounter (Signed)
Virtual Visit Pre-Appointment Phone Call  "(Name), I am calling you today to discuss your upcoming appointment. We are currently trying to limit exposure to the virus that causes COVID-19 by seeing patients at home rather than in the office."  1. "What is the BEST phone number to call the day of the visit?" - include this in appointment notes  2. "Do you have or have access to (through a family member/friend) a smartphone with video capability that we can use for your visit?" a. If yes - list this number in appt notes as "cell" (if different from BEST phone #) and list the appointment type as a VIDEO visit in appointment notes b. If no - list the appointment type as a PHONE visit in appointment notes  3. Confirm consent - "In the setting of the current Covid19 crisis, you are scheduled for a (phone or video) visit with your provider on (date) at (time).  Just as we do with many in-office visits, in order for you to participate in this visit, we must obtain consent.  If you'd like, I can send this to your mychart (if signed up) or email for you to review.  Otherwise, I can obtain your verbal consent now.  All virtual visits are billed to your insurance company just like a normal visit would be.  By agreeing to a virtual visit, we'd like you to understand that the technology does not allow for your provider to perform an examination, and thus may limit your provider's ability to fully assess your condition. If your provider identifies any concerns that need to be evaluated in person, we will make arrangements to do so.  Finally, though the technology is pretty good, we cannot assure that it will always work on either your or our end, and in the setting of a video visit, we may have to convert it to a phone-only visit.  In either situation, we cannot ensure that we have a secure connection.  Are you willing to proceed?" STAFF: Did the patient verbally acknowledge consent to telehealth visit? Document  YES/NO here: YES  4. Advise patient to be prepared - "Two hours prior to your appointment, go ahead and check your blood pressure, pulse, oxygen saturation, and your weight (if you have the equipment to check those) and write them all down. When your visit starts, your provider will ask you for this information. If you have an Apple Watch or Kardia device, please plan to have heart rate information ready on the day of your appointment. Please have a pen and paper handy nearby the day of the visit as well."  5. Give patient instructions for MyChart download to smartphone OR Doximity/Doxy.me as below if video visit (depending on what platform provider is using)  6. Inform patient they will receive a phone call 15 minutes prior to their appointment time (may be from unknown caller ID) so they should be prepared to answer    TELEPHONE CALL NOTE  Barbara Oliver has been deemed a candidate for a follow-up tele-health visit to limit community exposure during the Covid-19 pandemic. I spoke with the patient via phone to ensure availability of phone/video source, confirm preferred email & phone number, and discuss instructions and expectations.  I reminded Barbara Oliver to be prepared with any vital sign and/or heart rhythm information that could potentially be obtained via home monitoring, at the time of her visit. I reminded Barbara Oliver to expect a phone call prior to  her visit.  Dorris Fetch, CMA 08/25/2018 10:55 AM   INSTRUCTIONS FOR DOWNLOADING THE MYCHART APP TO SMARTPHONE  - The patient must first make sure to have activated MyChart and know their login information - If Apple, go to [employer] and type in MyChart in the search bar and download the app. If Android, ask patient to go to [employer] and type in [place] in the search bar and download the app. The app is free but as with any other app downloads, their phone may require them to verify saved payment information or  Apple/Android password.  - The patient will need to then log into the app with their MyChart username and password, and select [place] as their healthcare provider to link the account. When it is time for your visit, go to the MyChart app, find appointments, and click Begin Video Visit. Be sure to Select Allow for your device to access the Microphone and Camera for your visit. You will then be connected, and your provider will be with you shortly.  **If they have any issues connecting, or need assistance please contact MyChart service desk [phone])**  **If using a computer, in order to ensure the best quality for their visit they will need to use either of the following Internet Browsers: [employer] or Google Chrome**  IF USING DOXIMITY or DOXY.ME - The patient will receive a link just prior to their visit by text.     FULL LENGTH CONSENT FOR TELE-HEALTH VISIT   I hereby voluntarily request, consent and authorize [facility] and its employed or contracted physicians, physician assistants, nurse practitioners or other licensed health care professionals (the Practitioner), to provide me with telemedicine health care services (the "Services") as deemed necessary by the treating Practitioner. I acknowledge and consent to receive the Services by the Practitioner via telemedicine. I understand that the telemedicine visit will involve communicating with the Practitioner through live audiovisual communication technology and the disclosure of certain medical information by electronic transmission. I acknowledge that I have been given the opportunity to request an in-person assessment or other available alternative prior to the telemedicine visit and am voluntarily participating in the telemedicine visit.  I understand that I have the right to withhold or withdraw my consent to the use of telemedicine in the course of my care at any time, without affecting my right to future care  or treatment, and that the Practitioner or I may terminate the telemedicine visit at any time. I understand that I have the right to inspect all information obtained and/or recorded in the course of the telemedicine visit and may receive copies of available information for a reasonable fee.  I understand that some of the potential risks of receiving the Services via telemedicine include:  Marland Kitchen Delay or interruption in medical evaluation due to technological equipment failure or disruption; . Information transmitted may not be sufficient (e.g. poor resolution of images) to allow for appropriate medical decision making by the Practitioner; and/or  . In rare instances, security protocols could fail, causing a breach of personal health information.  Furthermore, I acknowledge that it is my responsibility to provide information about my medical history, conditions and care that is complete and accurate to the best of my ability. I acknowledge that Practitioner's advice, recommendations, and/or decision may be based on factors not within their control, such as incomplete or inaccurate data provided by me or distortions of diagnostic images or specimens that may result from electronic transmissions. I  understand that the practice of medicine is not an exact science and that Practitioner makes no warranties or guarantees regarding treatment outcomes. I acknowledge that I will receive a copy of this consent concurrently upon execution via email to the email address I last provided but may also request a printed copy by calling the office of [place].    I understand that my insurance will be billed for this visit.   I have read or had this consent read to me. . I understand the contents of this consent, which adequately explains the benefits and risks of the Services being provided via telemedicine.  . I have been provided ample opportunity to ask questions regarding this consent and the Services and have had  my questions answered to my satisfaction. . I give my informed consent for the services to be provided through the use of telemedicine in my medical care  By participating in this telemedicine visit I agree to the above.

## 2018-09-01 ENCOUNTER — Other Ambulatory Visit: Payer: Self-pay

## 2018-09-01 ENCOUNTER — Ambulatory Visit (HOSPITAL_COMMUNITY)
Admission: RE | Admit: 2018-09-01 | Discharge: 2018-09-01 | Disposition: A | Discharge status: Home / Self Care | Payer: Medicare HMO | Source: Ambulatory Visit | Attending: Cardiology | Admitting: Cardiology

## 2018-09-01 DIAGNOSIS — I6522 Occlusion and stenosis of left carotid artery: Secondary | ICD-10-CM

## 2018-09-04 ENCOUNTER — Telehealth: Payer: Self-pay

## 2018-09-04 ENCOUNTER — Other Ambulatory Visit: Payer: Self-pay

## 2018-09-04 ENCOUNTER — Telehealth: Payer: Self-pay | Admitting: Cardiovascular Disease

## 2018-09-04 DIAGNOSIS — I6522 Occlusion and stenosis of left carotid artery: Secondary | ICD-10-CM

## 2018-09-04 NOTE — Telephone Encounter (Signed)
New Message     Patient states she was referred to see Dr. Harold Barban for surgery after having Carotid Doppler done and states the referral was never sent would like a nurse to call her back for clarity.

## 2018-09-04 NOTE — Telephone Encounter (Signed)
No DPR on file for pt mobile number [phone] but is on file for [phone]. Lmtcb on both numbers. Left detailed message on [phone] stating that pt has been referred to Dr. Harold Barban in [facility] d/t results of carotid ultrasound and that their office will contact pt to set up an appt

## 2018-09-08 ENCOUNTER — Encounter: Payer: Self-pay | Admitting: Vascular Surgery

## 2018-09-09 NOTE — Telephone Encounter (Signed)
Patient has an appointment with [facility] 09-10-2018.

## 2018-09-10 ENCOUNTER — Telehealth: Payer: Self-pay | Admitting: *Deleted

## 2018-09-10 ENCOUNTER — Encounter: Payer: Self-pay | Admitting: Vascular Surgery

## 2018-09-10 ENCOUNTER — Ambulatory Visit (INDEPENDENT_AMBULATORY_CARE_PROVIDER_SITE_OTHER): Payer: Medicare HMO | Admitting: Vascular Surgery

## 2018-09-10 ENCOUNTER — Ambulatory Visit (HOSPITAL_COMMUNITY)
Admission: RE | Admit: 2018-09-10 | Discharge: 2018-09-10 | Disposition: A | Discharge status: Home / Self Care | Payer: Medicare HMO | Source: Ambulatory Visit | Attending: Family | Admitting: Family

## 2018-09-10 ENCOUNTER — Other Ambulatory Visit: Payer: Self-pay

## 2018-09-10 ENCOUNTER — Encounter: Payer: Self-pay | Admitting: *Deleted

## 2018-09-10 ENCOUNTER — Other Ambulatory Visit: Payer: Self-pay | Admitting: *Deleted

## 2018-09-10 VITALS — BP 137/77 | HR 74 | Temp 97.1°F | Resp 20 | Ht 73.0 in | Wt 194.0 lb

## 2018-09-10 DIAGNOSIS — I6522 Occlusion and stenosis of left carotid artery: Secondary | ICD-10-CM

## 2018-09-10 NOTE — Telephone Encounter (Signed)
1) What type of surgery is being performed? LEFT CEA   2) When is this surgery scheduled?  September 29, 2018  3) What type of clearance is required (Medical, Pharmacy or Both)? MEDICAL  4) Are there any medications that need to be held prior to surgery and how long?  NONE   5) Practice name and name of physician performing surgery? DR. Harrell Gave [facility]  6) What is your office phone number?  Leota Jacobsen, RN Surgical / Triage Nurse [facility]   [phone]

## 2018-09-10 NOTE — Telephone Encounter (Signed)
   Primary Cardiologist: Dr Stanford Breed  Chart reviewed as part of pre-operative protocol coverage. Given past medical history and time since last visit, based on ACC/AHA guidelines, Barbara Oliver would be at acceptable risk for the planned procedure without further cardiovascular testing.   I will route this recommendation to the requesting party via Epic fax function and remove from pre-op pool.  Please call with questions.  Kerin Ransom, PA-C 09/10/2018, 2:15 PM

## 2018-09-10 NOTE — Telephone Encounter (Signed)
Faxed to [facility] [place] via [employer].

## 2018-09-10 NOTE — H&P (View-Only) (Signed)
REASON FOR CONSULT:    Left carotid stenosis.  The consult is requested by Dr. Quay Burow.  ASSESSMENT & PLAN:   ASYMPTOMATIC GREATER THAN 80% LEFT CAROTID STENOSIS: This patient has an asymptomatic greater than 80% left carotid stenosis.  I recommended left carotid endarterectomy in order to lower her risk of future stroke.  She has another scheduling issue and therefore this has not been scheduled till 09/29/2018.  She knows to continue her aspirin through surgery. I have reviewed the indications for carotid endarterectomy, that is to lower the risk of future stroke. I have also reviewed the potential complications of surgery, including but not limited to: bleeding, stroke (perioperative risk 1-2%), MI, nerve injury of other unpredictable medical problems. All of the patients questions were answered and they are agreeable to proceed with surgery.   We did also discuss the importance of tobacco cessation.  [name], MD, FACS Beeper [phone] Office: [phone]   HPI:   Barbara Oliver is a pleasant 68 y.o. female, who was referred with a greater than 80% left carotid stenosis.  The patient is followed by Dr. Kirk Ruths.  She has a history of nonischemic cardiomyopathy and congestive heart failure.  An echocardiogram in February of this year showed normal LV function with moderate left ventricular hypertrophy.  She had a carotid duplex scan done at his office on 09/01/2018.  I believe this was done because she was having some symptoms in her right neck.  The symptoms have subsequently resolved.  However the duplex scan showed a greater than 80% left carotid stenosis with no significant stenosis on the right.  She was sent for vascular consultation.  Of note she is left-handed.  She denies any history of stroke, TIAs, expressive or receptive aphasia, or amaurosis fugax.  She is on aspirin and is on a statin.  Her risk factors for peripheral vascular disease include hypertension,  hypercholesterolemia, and tobacco use.  She states that a pack will last her a month.  She denies any history of diabetes or family history of premature cardiovascular disease.  Past Medical History:  Diagnosis Date  . Abdominal pain, epigastric 10/06/2015  . Acid reflux   . Acute on chronic systolic (congestive) heart failure ([place]) 03/19/2017  . Acute renal insufficiency 10/06/2015  . Acute respiratory failure with hypoxia ([place]) 04/26/2018  . Chronic systolic CHF (congestive heart failure) ([place])   . CKD (chronic kidney disease), stage III ([place])   . 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 COPD with acute bronchitis ([place]) 04/25/2018  . Elevated transaminase level 10/06/2015  . History of noncompliance with medical treatment, presenting hazards to health   . Hyperglycemia 10/06/2015  . LBBB (left bundle branch block)   . Lung nodule    a. seen on prior CT, f/u due 11/2017.  . Mitral regurgitation   . NICM (nonischemic cardiomyopathy) ([place])    a. prior hx of this, EF 20-25% in 11/2016. b. EF 30-35% by cath 03/2017 with normal coronaries.  . Protein-calorie malnutrition, severe 03/07/2016  . Respiratory distress 04/25/2018  . Severe mitral regurgitation 10/06/2015  . Tobacco abuse     Family History  Problem Relation Age of Onset  . Hypertension Mother   . Heart attack Father     SOCIAL HISTORY: Social History   Socioeconomic History  . Marital status: Married    Spouse name: Not on file  . Number of children: Not on file  . Years of education: Not on file  .  Highest education level: Not on file  Occupational History  . Occupation: retired  Social Needs  . Financial resource strain: Not on file  . Food insecurity    Worry: Not on file    Inability: Not on file  . Transportation needs    Medical: Not on file    Non-medical: Not on file  Tobacco Use  . Smoking status: Light Tobacco Smoker    Packs/day: 0.50    Years: 50.00    Pack years: 25.00    Types: Cigarettes   . Smokeless tobacco: Never Used  . Tobacco comment: Pt reports a pack will last her about a month  Substance and Sexual Activity  . Alcohol use: No  . Drug use: No  . Sexual activity: Not on file  Lifestyle  . Physical activity    Days per week: Not on file    Minutes per session: Not on file  . Stress: Not on file  Relationships  . Social connections    Talks on phone: Not on file    Gets together: Not on file    Attends religious service: Not on file    Active member of club or organization: Not on file    Attends meetings of clubs or organizations: Not on file    Relationship status: Not on file  . Intimate partner violence    Fear of current or ex partner: Not on file    Emotionally abused: Not on file    Physically abused: Not on file    Forced sexual activity: Not on file  Other Topics Concern  . Not on file  Social History Narrative  . Not on file    No Known Allergies  Current Outpatient Medications  Medication Sig Dispense Refill  . acetaminophen (TYLENOL) 500 MG tablet Take 1,000 mg by mouth daily as needed for mild pain.    . albuterol (PROVENTIL HFA;VENTOLIN HFA) 108 (90 Base) MCG/ACT inhaler Inhale 1-2 puffs into the lungs every 6 (six) hours as needed for wheezing or shortness of breath. 1 Inhaler 0  . albuterol (PROVENTIL) (2.5 MG/3ML) 0.083% nebulizer solution     . aspirin EC 81 MG EC tablet Take 1 tablet (81 mg total) by mouth daily.    . atorvastatin (LIPITOR) 20 MG tablet Take 20 mg by mouth daily.    . budesonide-formoterol (SYMBICORT) 160-4.5 MCG/ACT inhaler Inhale 2 puffs into the lungs 2 (two) times daily as needed (SOB).    . carvedilol (COREG) 12.5 MG tablet Take 12.5 mg by mouth 2 (two) times daily with a meal.    . clopidogrel (PLAVIX) 75 MG tablet Take 1 tablet by mouth daily.    . ENTRESTO 24-26 MG Take 1 tablet by mouth daily.    . fluticasone (FLONASE) 50 MCG/ACT nasal spray Place 1 spray into both nostrils daily. 16 g 0  . furosemide  (LASIX) 40 MG tablet TAKE 1 TABLET BY MOUTH DAILY. 30 tablet 6  . Investigational - Study Medication Study name: GALACTIC Study - Placebo / omecamtiv mecarbil tablet Take 1 tablet by mouth twice daily 1 each PRN  . pantoprazole (PROTONIX) 40 MG tablet Take 1 tablet (40 mg total) by mouth daily. 30 tablet 0  . spironolactone (ALDACTONE) 25 MG tablet TAKE 1 TABLET BY MOUTH DAILY. 30 tablet 6  . umeclidinium-vilanterol (ANORO ELLIPTA) 62.5-25 MCG/INH AEPB Inhale 1 puff into the lungs daily as needed (SOB).    . Vitamin D, Ergocalciferol, (DRISDOL) 1.25 MG (50000 UT) CAPS capsule   Take 50,000 Units by mouth every 7 (seven) days.     No current facility-administered medications for this visit.     REVIEW OF SYSTEMS:  [X]  denotes positive finding, [ ]  denotes negative finding Cardiac  Comments:  Chest pain or chest pressure:    Shortness of breath upon exertion:    Short of breath when lying flat:    Irregular heart rhythm:        Vascular    Pain in calf, thigh, or hip brought on by ambulation:    Pain in feet at night that wakes you up from your sleep:     Blood clot in your veins:    Leg swelling:         Pulmonary    Oxygen at home:    Productive cough:     Wheezing:         Neurologic    Sudden weakness in arms or legs:     Sudden numbness in arms or legs:     Sudden onset of difficulty speaking or slurred speech:    Temporary loss of vision in one eye:     Problems with dizziness:         Gastrointestinal    Blood in stool:     Vomited blood:         Genitourinary    Burning when urinating:     Blood in urine:        Psychiatric    Major depression:         Hematologic    Bleeding problems:    Problems with blood clotting too easily:        Skin    Rashes or ulcers:        Constitutional    Fever or chills:     PHYSICAL EXAM:   Vitals:   09/10/18 1203  BP: 137/77  Pulse: 74  Resp: 20  Temp: (!) 97.1 F (36.2 C)  SpO2: 95%  Weight: 194 lb (88 kg)   Height: 6\' 1"  (1.854 m)    GENERAL: The patient is a well-nourished female, in no acute distress. The vital signs are documented above. CARDIAC: There is a regular rate and rhythm.  VASCULAR: She has a left carotid bruit. She has palpable femoral and popliteal pulses bilaterally.  She has a palpable left dorsalis pedis pulse.  I cannot palpate pulses in the right foot.  Both feet however are warm and well-perfused. She has no significant lower extremity swelling. PULMONARY: There is good air exchange bilaterally without wheezing or rales. ABDOMEN: Soft and non-tender with normal pitched bowel sounds.  MUSCULOSKELETAL: There are no major deformities or cyanosis. NEUROLOGIC: No focal weakness or paresthesias are detected. SKIN: There are no ulcers or rashes noted. PSYCHIATRIC: The patient has a normal affect.  DATA:    CAROTID DUPLEX: I reviewed the carotid duplex scan that was done on 06/01/2018.  This showed a greater than 80% left carotid stenosis with no significant stenosis on the right.  LIMITED CAROTID DUPLEX: I have interpreted her limited carotid duplex scan today of the left carotid system.  This confirms a greater than 80% left carotid stenosis.  The bifurcation does not appear to be high.  CARDIAC CATHETERIZATION: The patient did have cardiac catheterization in January 2019 which showed normal coronary arteries.  ECHO: Most recent echo on 04/27/2018 showed left ventricular had normal systolic function with an ejection fraction of 60 to 65%.

## 2018-09-10 NOTE — Progress Notes (Signed)
REASON FOR CONSULT:    Left carotid stenosis.  The consult is requested by Dr. Quay Burow.  ASSESSMENT & PLAN:   ASYMPTOMATIC GREATER THAN 80% LEFT CAROTID STENOSIS: This patient has an asymptomatic greater than 80% left carotid stenosis.  I recommended left carotid endarterectomy in order to lower her risk of future stroke.  She has another scheduling issue and therefore this has not been scheduled till 09/29/2018.  She knows to continue her aspirin through surgery. I have reviewed the indications for carotid endarterectomy, that is to lower the risk of future stroke. I have also reviewed the potential complications of surgery, including but not limited to: bleeding, stroke (perioperative risk 1-2%), MI, nerve injury of other unpredictable medical problems. All of the patients questions were answered and they are agreeable to proceed with surgery.   We did also discuss the importance of tobacco cessation.  [name], MD, FACS Beeper [phone] Office: [phone]   HPI:   Barbara Oliver is a pleasant 68 y.o. female, who was referred with a greater than 80% left carotid stenosis.  The patient is followed by Dr. Kirk Ruths.  She has a history of nonischemic cardiomyopathy and congestive heart failure.  An echocardiogram in February of this year showed normal LV function with moderate left ventricular hypertrophy.  She had a carotid duplex scan done at his office on 09/01/2018.  I believe this was done because she was having some symptoms in her right neck.  The symptoms have subsequently resolved.  However the duplex scan showed a greater than 80% left carotid stenosis with no significant stenosis on the right.  She was sent for vascular consultation.  Of note she is left-handed.  She denies any history of stroke, TIAs, expressive or receptive aphasia, or amaurosis fugax.  She is on aspirin and is on a statin.  Her risk factors for peripheral vascular disease include hypertension,  hypercholesterolemia, and tobacco use.  She states that a pack will last her a month.  She denies any history of diabetes or family history of premature cardiovascular disease.  Past Medical History:  Diagnosis Date  . Abdominal pain, epigastric 10/06/2015  . Acid reflux   . Acute on chronic systolic (congestive) heart failure ([place]) 03/19/2017  . Acute renal insufficiency 10/06/2015  . Acute respiratory failure with hypoxia ([place]) 04/26/2018  . Chronic systolic CHF (congestive heart failure) ([place])   . CKD (chronic kidney disease), stage III ([place])   . 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 COPD with acute bronchitis ([place]) 04/25/2018  . Elevated transaminase level 10/06/2015  . History of noncompliance with medical treatment, presenting hazards to health   . Hyperglycemia 10/06/2015  . LBBB (left bundle branch block)   . Lung nodule    a. seen on prior CT, f/u due 11/2017.  . Mitral regurgitation   . NICM (nonischemic cardiomyopathy) ([place])    a. prior hx of this, EF 20-25% in 11/2016. b. EF 30-35% by cath 03/2017 with normal coronaries.  . Protein-calorie malnutrition, severe 03/07/2016  . Respiratory distress 04/25/2018  . Severe mitral regurgitation 10/06/2015  . Tobacco abuse     Family History  Problem Relation Age of Onset  . Hypertension Mother   . Heart attack Father     SOCIAL HISTORY: Social History   Socioeconomic History  . Marital status: Married    Spouse name: Not on file  . Number of children: Not on file  . Years of education: Not on file  .  Highest education level: Not on file  Occupational History  . Occupation: retired  Engineer, productionocial Needs  . Financial resource strain: Not on file  . Food insecurity    Worry: Not on file    Inability: Not on file  . Transportation needs    Medical: Not on file    Non-medical: Not on file  Tobacco Use  . Smoking status: Light Tobacco Smoker    Packs/day: 0.50    Years: 50.00    Pack years: 25.00    Types: Cigarettes   . Smokeless tobacco: Never Used  . Tobacco comment: Pt reports a pack will last her about a month  Substance and Sexual Activity  . Alcohol use: No  . Drug use: No  . Sexual activity: Not on file  Lifestyle  . Physical activity    Days per week: Not on file    Minutes per session: Not on file  . Stress: Not on file  Relationships  . Social Musicianconnections    Talks on phone: Not on file    Gets together: Not on file    Attends religious service: Not on file    Active member of club or organization: Not on file    Attends meetings of clubs or organizations: Not on file    Relationship status: Not on file  . Intimate partner violence    Fear of current or ex partner: Not on file    Emotionally abused: Not on file    Physically abused: Not on file    Forced sexual activity: Not on file  Other Topics Concern  . Not on file  Social History Narrative  . Not on file    No Known Allergies  Current Outpatient Medications  Medication Sig Dispense Refill  . acetaminophen (TYLENOL) 500 MG tablet Take 1,000 mg by mouth daily as needed for mild pain.    Marland Kitchen. albuterol (PROVENTIL HFA;VENTOLIN HFA) 108 (90 Base) MCG/ACT inhaler Inhale 1-2 puffs into the lungs every 6 (six) hours as needed for wheezing or shortness of breath. 1 Inhaler 0  . albuterol (PROVENTIL) (2.5 MG/3ML) 0.083% nebulizer solution     . aspirin EC 81 MG EC tablet Take 1 tablet (81 mg total) by mouth daily.    Marland Kitchen. atorvastatin (LIPITOR) 20 MG tablet Take 20 mg by mouth daily.    . budesonide-formoterol (SYMBICORT) 160-4.5 MCG/ACT inhaler Inhale 2 puffs into the lungs 2 (two) times daily as needed (SOB).    . carvedilol (COREG) 12.5 MG tablet Take 12.5 mg by mouth 2 (two) times daily with a meal.    . clopidogrel (PLAVIX) 75 MG tablet Take 1 tablet by mouth daily.    Marland Kitchen. ENTRESTO 24-26 MG Take 1 tablet by mouth daily.    . fluticasone (FLONASE) 50 MCG/ACT nasal spray Place 1 spray into both nostrils daily. 16 g 0  . furosemide  (LASIX) 40 MG tablet TAKE 1 TABLET BY MOUTH DAILY. 30 tablet 6  . Investigational - Study Medication Study name: GALACTIC Study - Placebo / omecamtiv mecarbil tablet Take 1 tablet by mouth twice daily 1 each PRN  . pantoprazole (PROTONIX) 40 MG tablet Take 1 tablet (40 mg total) by mouth daily. 30 tablet 0  . spironolactone (ALDACTONE) 25 MG tablet TAKE 1 TABLET BY MOUTH DAILY. 30 tablet 6  . umeclidinium-vilanterol (ANORO ELLIPTA) 62.5-25 MCG/INH AEPB Inhale 1 puff into the lungs daily as needed (SOB).    . Vitamin D, Ergocalciferol, (DRISDOL) 1.25 MG (50000 UT) CAPS capsule  Take 50,000 Units by mouth every 7 (seven) days.     No current facility-administered medications for this visit.     REVIEW OF SYSTEMS:  [X]  denotes positive finding, [ ]  denotes negative finding Cardiac  Comments:  Chest pain or chest pressure:    Shortness of breath upon exertion:    Short of breath when lying flat:    Irregular heart rhythm:        Vascular    Pain in calf, thigh, or hip brought on by ambulation:    Pain in feet at night that wakes you up from your sleep:     Blood clot in your veins:    Leg swelling:         Pulmonary    Oxygen at home:    Productive cough:     Wheezing:         Neurologic    Sudden weakness in arms or legs:     Sudden numbness in arms or legs:     Sudden onset of difficulty speaking or slurred speech:    Temporary loss of vision in one eye:     Problems with dizziness:         Gastrointestinal    Blood in stool:     Vomited blood:         Genitourinary    Burning when urinating:     Blood in urine:        Psychiatric    Major depression:         Hematologic    Bleeding problems:    Problems with blood clotting too easily:        Skin    Rashes or ulcers:        Constitutional    Fever or chills:     PHYSICAL EXAM:   Vitals:   09/10/18 1203  BP: 137/77  Pulse: 74  Resp: 20  Temp: (!) 97.1 F (36.2 C)  SpO2: 95%  Weight: 194 lb (88 kg)   Height: 6\' 1"  (1.854 m)    GENERAL: The patient is a well-nourished female, in no acute distress. The vital signs are documented above. CARDIAC: There is a regular rate and rhythm.  VASCULAR: She has a left carotid bruit. She has palpable femoral and popliteal pulses bilaterally.  She has a palpable left dorsalis pedis pulse.  I cannot palpate pulses in the right foot.  Both feet however are warm and well-perfused. She has no significant lower extremity swelling. PULMONARY: There is good air exchange bilaterally without wheezing or rales. ABDOMEN: Soft and non-tender with normal pitched bowel sounds.  MUSCULOSKELETAL: There are no major deformities or cyanosis. NEUROLOGIC: No focal weakness or paresthesias are detected. SKIN: There are no ulcers or rashes noted. PSYCHIATRIC: The patient has a normal affect.  DATA:    CAROTID DUPLEX: I reviewed the carotid duplex scan that was done on 06/01/2018.  This showed a greater than 80% left carotid stenosis with no significant stenosis on the right.  LIMITED CAROTID DUPLEX: I have interpreted her limited carotid duplex scan today of the left carotid system.  This confirms a greater than 80% left carotid stenosis.  The bifurcation does not appear to be high.  CARDIAC CATHETERIZATION: The patient did have cardiac catheterization in January 2019 which showed normal coronary arteries.  ECHO: Most recent echo on 04/27/2018 showed left ventricular had normal systolic function with an ejection fraction of 60 to 65%.

## 2018-09-15 ENCOUNTER — Other Ambulatory Visit: Payer: Self-pay

## 2018-09-15 ENCOUNTER — Encounter: Payer: Medicare HMO | Admitting: *Deleted

## 2018-09-15 VITALS — BP 106/54 | HR 73 | Temp 98.0°F | Wt 197.4 lb

## 2018-09-15 DIAGNOSIS — Z006 Encounter for examination for normal comparison and control in clinical research program: Secondary | ICD-10-CM

## 2018-09-15 NOTE — Research (Signed)
Week 288/EOS visit. Pt doing well with no AE/SAE/PEP to report. Lab were drawn. Thanked her for her participation in the study.

## 2018-09-23 NOTE — Pre-Procedure Instructions (Signed)
MARGRETTA ZAMORANO  09/23/2018      [address], [address] [place] [address] Phone: [phone] Fax: [phone]    Your procedure is scheduled on 09/29/18.  Report to [facility] Admitting at 730 A.M.  Call this number if you have problems the morning of surgery:  [phone]   Remember:  Do not eat or drink after midnight.     Take these medicines the morning of surgery with A SIP OF WATER ---[place] Plants    Do not wear jewelry, make-up or nail polish.  Do not wear lotions, powders, or perfumes, or deodorant.  Do not shave 48 hours prior to surgery.  Men may shave face and neck.  Do not bring valuables to the hospital.  [facility] is not responsible for any belongings or valuables.  Contacts, dentures or bridgework may not be worn into surgery.  Leave your suitcase in the car.  After surgery it may be brought to your room.  For patients admitted to the hospital, discharge time will be determined by your treatment team.  Patients discharged the day of surgery will not be allowed to drive home.    Special instructions:  Do not take any aspirin,anti-inflammatories,vitamins,or herbal supplements 5-7 days prior to surgery.[place] - Preparing for Surgery  Before surgery, you can play an important role.  Because skin is not sterile, your skin needs to be as free of germs as possible.  You can reduce the number of germs on you skin by washing with CHG (chlorahexidine gluconate) soap before surgery.  CHG is an antiseptic cleaner which kills germs and bonds with the skin to continue killing germs even after washing.  Oral Hygiene is also important in reducing the risk of infection.  Remember to brush your teeth with your regular toothpaste the morning of surgery.  Please DO NOT use if you have an allergy to CHG or antibacterial soaps.  If your skin becomes  reddened/irritated stop using the CHG and inform your nurse when you arrive at Short Stay.  Do not shave (including legs and underarms) for at least 48 hours prior to the first CHG shower.  You may shave your face.  Please follow these instructions carefully:   1.  Shower with CHG Soap the night before surgery and the morning of Surgery.  2.  If you choose to wash your hair, wash your hair first as usual with your normal shampoo.  3.  After you shampoo, rinse your hair and body thoroughly to remove the shampoo. 4.  Use CHG as you would any other liquid soap.  You can apply chg directly to the skin and wash gently with a      scrungie or washcloth.           5.  Apply the CHG Soap to your body ONLY FROM THE NECK DOWN.   Do not use on open wounds or open sores. Avoid contact with your eyes, ears, mouth and genitals (private parts).  Wash genitals (private parts) with your normal soap.  6.  Wash thoroughly, paying special attention to the area where your surgery will be performed.  7.  Thoroughly rinse your body with warm water from the neck down.  8.  DO NOT shower/wash with your normal soap after using and rinsing off the CHG Soap.  9.  Pat yourself dry with a clean towel.  10.  Wear clean pajamas.            11.  Place clean sheets on your bed the night of your first shower and do not sleep with pets.  Day of Surgery  Do not apply any lotions/deoderants the morning of surgery.   Please wear clean clothes to the hospital/surgery center. Remember to brush your teeth with toothpaste.    Please read over the following fact sheets that you were given. MRSA Information

## 2018-09-24 ENCOUNTER — Other Ambulatory Visit: Payer: Self-pay

## 2018-09-24 ENCOUNTER — Encounter (HOSPITAL_COMMUNITY)
Admission: RE | Admit: 2018-09-24 | Discharge: 2018-09-24 | Disposition: A | Discharge status: Home / Self Care | Payer: Medicare HMO | Source: Ambulatory Visit | Attending: Vascular Surgery | Admitting: Vascular Surgery

## 2018-09-24 ENCOUNTER — Encounter (HOSPITAL_COMMUNITY): Payer: Self-pay

## 2018-09-24 DIAGNOSIS — Z79899 Other long term (current) drug therapy: Secondary | ICD-10-CM | POA: Insufficient documentation

## 2018-09-24 DIAGNOSIS — Z7951 Long term (current) use of inhaled steroids: Secondary | ICD-10-CM | POA: Insufficient documentation

## 2018-09-24 DIAGNOSIS — I5022 Chronic systolic (congestive) heart failure: Secondary | ICD-10-CM | POA: Insufficient documentation

## 2018-09-24 DIAGNOSIS — I428 Other cardiomyopathies: Secondary | ICD-10-CM | POA: Insufficient documentation

## 2018-09-24 DIAGNOSIS — K219 Gastro-esophageal reflux disease without esophagitis: Secondary | ICD-10-CM | POA: Insufficient documentation

## 2018-09-24 DIAGNOSIS — I340 Nonrheumatic mitral (valve) insufficiency: Secondary | ICD-10-CM | POA: Insufficient documentation

## 2018-09-24 DIAGNOSIS — J449 Chronic obstructive pulmonary disease, unspecified: Secondary | ICD-10-CM | POA: Insufficient documentation

## 2018-09-24 DIAGNOSIS — I447 Left bundle-branch block, unspecified: Secondary | ICD-10-CM | POA: Insufficient documentation

## 2018-09-24 DIAGNOSIS — N183 Chronic kidney disease, stage 3 (moderate): Secondary | ICD-10-CM | POA: Insufficient documentation

## 2018-09-24 DIAGNOSIS — I6522 Occlusion and stenosis of left carotid artery: Secondary | ICD-10-CM | POA: Insufficient documentation

## 2018-09-24 DIAGNOSIS — Z1159 Encounter for screening for other viral diseases: Secondary | ICD-10-CM | POA: Insufficient documentation

## 2018-09-24 DIAGNOSIS — Z01812 Encounter for preprocedural laboratory examination: Secondary | ICD-10-CM | POA: Insufficient documentation

## 2018-09-24 DIAGNOSIS — Z7982 Long term (current) use of aspirin: Secondary | ICD-10-CM | POA: Insufficient documentation

## 2018-09-24 HISTORY — DX: Dyspnea, unspecified: R06.00

## 2018-09-24 LAB — CBC
HCT: 39.3 % (ref 36.0–46.0)
Hemoglobin: 12.2 g/dL (ref 12.0–15.0)
MCH: 28.9 pg (ref 26.0–34.0)
MCHC: 31 g/dL (ref 30.0–36.0)
MCV: 93.1 fL (ref 80.0–100.0)
Platelets: 352 10*3/uL (ref 150–400)
RBC: 4.22 MIL/uL (ref 3.87–5.11)
RDW: 13.6 % (ref 11.5–15.5)
WBC: 6.5 10*3/uL (ref 4.0–10.5)
nRBC: 0 % (ref 0.0–0.2)

## 2018-09-24 LAB — COMPREHENSIVE METABOLIC PANEL
ALT: 14 U/L (ref 0–44)
AST: 18 U/L (ref 15–41)
Albumin: 4 g/dL (ref 3.5–5.0)
Alkaline Phosphatase: 122 U/L (ref 38–126)
Anion gap: 12 (ref 5–15)
BUN: 19 mg/dL (ref 8–23)
CO2: 20 mmol/L — ABNORMAL LOW (ref 22–32)
Calcium: 9.7 mg/dL (ref 8.9–10.3)
Chloride: 105 mmol/L (ref 98–111)
Creatinine, Ser: 1.12 mg/dL — ABNORMAL HIGH (ref 0.44–1.00)
GFR calc Af Amer: 59 mL/min — ABNORMAL LOW (ref >60)
GFR calc non Af Amer: 51 mL/min — ABNORMAL LOW (ref >60)
Glucose, Bld: 86 mg/dL (ref 70–99)
Potassium: 4.1 mmol/L (ref 3.5–5.1)
Sodium: 137 mmol/L (ref 135–145)
Total Bilirubin: 0.3 mg/dL (ref 0.3–1.2)
Total Protein: 7.7 g/dL (ref 6.5–8.1)

## 2018-09-24 LAB — BLOOD GAS, ARTERIAL
Acid-Base Excess: 1.3 mmol/L (ref 0.0–2.0)
Bicarbonate: 24.8 mmol/L (ref 20.0–28.0)
Drawn by: 42180
FIO2: 21
O2 Saturation: 98.6 %
Patient temperature: 98.6
pCO2 arterial: 35.5 mmHg (ref 32.0–48.0)
pH, Arterial: 7.459 — ABNORMAL HIGH (ref 7.350–7.450)
pO2, Arterial: 115 mmHg — ABNORMAL HIGH (ref 83.0–108.0)

## 2018-09-24 LAB — URINALYSIS, ROUTINE W REFLEX MICROSCOPIC
Bacteria, UA: NONE SEEN
Bilirubin Urine: NEGATIVE
Glucose, UA: NEGATIVE mg/dL
Ketones, ur: NEGATIVE mg/dL
Nitrite: NEGATIVE
Protein, ur: NEGATIVE mg/dL
Specific Gravity, Urine: 1.012 (ref 1.005–1.030)
pH: 5 (ref 5.0–8.0)

## 2018-09-24 LAB — TYPE AND SCREEN
ABO/RH(D): O POS
Antibody Screen: NEGATIVE

## 2018-09-24 LAB — SURGICAL PCR SCREEN
MRSA, PCR: NEGATIVE
Staphylococcus aureus: NEGATIVE

## 2018-09-24 LAB — APTT: aPTT: 37 seconds — ABNORMAL HIGH (ref 24–36)

## 2018-09-24 LAB — PROTIME-INR
INR: 1 (ref 0.8–1.2)
Prothrombin Time: 12.8 seconds (ref 11.4–15.2)

## 2018-09-24 LAB — ABO/RH: ABO/RH(D): O POS

## 2018-09-24 MED ORDER — CHLORHEXIDINE GLUCONATE CLOTH 2 % EX PADS: 6.0000 | MEDICATED_PAD | Freq: Once | CUTANEOUS | Status: DC

## 2018-09-24 NOTE — Progress Notes (Signed)
PCP - mccoy  At [facility] Cardiologist - Dr Stanford Breed and Dr Claudie Leach (requested notes from [place]) Chest x-ray - 1/19 EKG - 2/20 Stress Test -  ECHO - 2/20 Cardiac Cath -1/19   Sleep Study - no CPAP - no  Fasting Blood Sugar - na Checks Blood Sugar _____ times a day  Blood Thinner Instructions:continue plavix per Ebony Hail PA   Anesthesia review: heart hx  Patient denies shortness of breath, fever, cough and chest pain at PAT appointment   Patient verbalized understanding of instructions that were given to them at the PAT appointment. Patient was also instructed that they will need to review over the PAT instructions again at home before surgery.

## 2018-09-25 NOTE — Anesthesia Preprocedure Evaluation (Addendum)
Anesthesia Evaluation  Patient identified by MRN, date of birth, ID band Patient awake    Reviewed: Allergy & Precautions, NPO status , Patient's Chart, lab work & pertinent test results  History of Anesthesia Complications Negative for: history of anesthetic complications  Airway Mallampati: II  TM Distance: >3 FB Neck ROM: Full    Dental  (+) Dental Advisory Given   Pulmonary shortness of breath, COPD,  COPD inhaler, Current Smoker,    breath sounds clear to auscultation       Cardiovascular hypertension, Pt. on medications and Pt. on home beta blockers +CHF  + dysrhythmias  Rhythm:Regular     Neuro/Psych negative neurological ROS  negative psych ROS   GI/Hepatic Neg liver ROS, GERD  Medicated and Controlled,  Endo/Other  negative endocrine ROS  Renal/GU CRFRenal disease     Musculoskeletal negative musculoskeletal ROS (+)   Abdominal   Peds  Hematology negative hematology ROS (+)   Anesthesia Other Findings History includes smoking, chronic systolic CHF (diagnosed 04/6832, but no consistent out-patient cardiology follow-up until ~ 03/2017), non-ischemic cardiomyopathy, mitral regurgitation (severe MR 7/207, but only trivial MR 04/2018 echo), chronic left BBB, CKD stage III, lung nodule (11/2016 redemonstration of RML nodule, unchanged since 03/10/16, follow-up recommended ~ Sept-Dec 2019; cardiologist Dr. Verdon Cummins 08/18/18 note indicates LDCT lung CA screen 06/16/18 although I do not currently have those results), COPD, protein-calorie malnutrition.  Preoperative cardiology input outlined by Kerin Ransom, PA-C on 09/10/18 states, "...Given past medical history and time since last visit, based on ACC/AHA guidelines,Barbara B Bowewould be at acceptable risk for the planned procedure without further cardiovascular testing." (She was last evaluated by Dr. Gwenlyn Found on 08/25/18 and by Dr. Claudie Leach 08/18/18.)   Reproductive/Obstetrics                            Anesthesia Physical Anesthesia Plan  ASA: III  Anesthesia Plan: General   Post-op Pain Management:    Induction: Intravenous  PONV Risk Score and Plan: 2 and Ondansetron and Dexamethasone  Airway Management Planned: Oral ETT  Additional Equipment: Arterial line  Intra-op Plan:   Post-operative Plan: Extubation in OR  Informed Consent: I have reviewed the patients History and Physical, chart, labs and discussed the procedure including the risks, benefits and alternatives for the proposed anesthesia with the patient or authorized representative who has indicated his/her understanding and acceptance.     Dental advisory given  Plan Discussed with: CRNA and Surgeon  Anesthesia Plan Comments: (PAT note written 09/25/2018 by Myra Gianotti, PA-C. )       Anesthesia Quick Evaluation

## 2018-09-25 NOTE — Progress Notes (Signed)
Anesthesia Chart Review:  Case: [id number] Date/Time: 09/29/18 0915   Procedure: ENDARTERECTOMY CAROTID (Left )   Anesthesia type: General   Pre-op diagnosis: left carotid stenosis   [facility] ROOM [facility] / [facility]   Surgeon: Chuck Hint, MD      DISCUSSION: Patient is a 68 year old Oliver scheduled for the above procedure.  History includes smoking, chronic systolic CHF (diagnosed 09/2013, but no consistent out-patient cardiology follow-up until ~ 03/2017), non-ischemic cardiomyopathy, mitral regurgitation (severe MR 7/207, but only trivial MR 04/2018 echo), chronic left BBB, CKD stage III, lung nodule (11/2016 redemonstration of RML nodule, unchanged since 03/10/16, follow-up recommended ~ Sept-Dec 2019; cardiologist Dr. Dyke Brackett 08/18/18 note indicates LDCT lung CA screen 06/16/18 although I do not currently have those results), COPD, protein-calorie malnutrition.  Preoperative cardiology input outlined by Corine Shelter, PA-C on 09/10/18 states, "...Given past medical history and time since last visit, based on ACC/AHA guidelines, Noga B Bergey would be at acceptable risk for the planned procedure without further cardiovascular testing." (She was last evaluated by Dr. Allyson Sabal on 08/25/18 and by Dr. Hanley Hays 08/18/18.)  I called and spoke with [facility] RN Joyce Gross regarding patient's ASA and Plavix instructions and was advised that patient was to continue both. PAT RN notified patient.   Presurgical COVID-19 test is scheduled for 09/26/18. If negative and otherwise no acute changes then I anticipate that he can proceed as planned.   VS: BP 133/74   Pulse 75   Temp 36.6 C   Resp 20   Ht 6\' 1"  (1.854 m)   Wt 89.5 kg   SpO2 100%   BMI 26.03 kg/m    PROVIDERS: Courtney Paris, NP is PCP [facility]) - Lollie Marrow, MD is primary cardiology [facility]), but he referred her to Nanetta Batty, MD for carotid stenosis. Dr. Allyson Sabal repeated carotid studies and referred her to [facility]  intervention. (She had previously seen cardiologist Olga Millers, MD in 2019.)   LABS: Labs reviewed: Acceptable for surgery. (all labs ordered are listed, but only abnormal results are displayed)  Labs Reviewed  APTT - Abnormal; Notable for the following components:      Result Value   aPTT 37 (*)    All other components within normal limits  BLOOD GAS, ARTERIAL - Abnormal; Notable for the following components:   pH, Arterial 7.459 (*)    pO2, Arterial 115 (*)    All other components within normal limits  COMPREHENSIVE METABOLIC PANEL - Abnormal; Notable for the following components:   CO2 20 (*)    Creatinine, Ser 1.12 (*)    GFR calc non Af Amer 51 (*)    GFR calc Af Amer 59 (*)    All other components within normal limits  URINALYSIS, ROUTINE W REFLEX MICROSCOPIC - Abnormal; Notable for the following components:   Hgb urine dipstick SMALL (*)    Leukocytes,Ua TRACE (*)    All other components within normal limits  SURGICAL PCR SCREEN  CBC  PROTIME-INR  TYPE AND SCREEN  ABO/RH     IMAGES: 1V PCXR 04/25/18: IMPRESSION: Stable cardiomegaly. Mild nodular interstitial prominence throughout the lungs, stable. Question mild chronic interstitial edema versus underlying interstitial lung disease. No new opacity evident. No consolidation in particular. Aortic Atherosclerosis (ICD10-I70.0).   EKG: 04/26/18: Normal sinus rhythm Possible Left atrial enlargement Left bundle branch block Abnormal ECG Confirmed by Hillis Range ([id number]) on 04/26/2018 9:52:33 PM   CV: Left Carotid US 09/10/18: Summary: Left Carotid:  Velocities in the left ICA are consistent with a 80-99% stenosis.               Non-hemodynamically significant plaque noted in the CCA. The ECA               appears <50% stenosed. Vertebrals:  Right vertebral artery demonstrates antegrade flow. Subclavians: Normal flow hemodynamics were seen in the right subclavian artery.  Carotid 09/01/18: Summary: Right  Carotid: Velocities in the right ICA are consistent with a 1-39% stenosis.                Non-hemodynamically significant plaque <50% noted in the CCA. Left Carotid: Velocities in the left ICA are consistent with a 80-99% stenosis.               Non-hemodynamically significant plaque noted in the CCA. Vertebrals:  Bilateral vertebral arteries demonstrate antegrade flow. Subclavians: Normal flow hemodynamics were seen in bilateral subclavian              arteries. (CTA neck 07/16/18: Atherosclerosis at the right carotid bifurcation without evidence of hemodynamically significant stenosis. High-grade focal likely > 90% stenosis at the origin of the LICA.)   Echo 04/27/18: IMPRESSIONS  1. The left ventricle has normal systolic function with an ejection fraction of 60-65%. The cavity size was normal. There is moderately increased left ventricular wall thickness. Left ventricular diastolic Doppler parameters are indeterminate due to  nondiagnostic images. Elevated left ventricular end-diastolic pressure The E/e' is 16.114.3. No evidence of left ventricular regional wall motion abnormalities.  2. The right ventricle has normal systolic function. The cavity was normal. There is no increase in right ventricular wall thickness.  3. The mitral valve is normal in structure. There is mild mitral annular calcification present.  4. The tricuspid valve is normal in structure.  5. The aortic valve is tricuspid. Mild sclerosis of the aortic valve.  6. The pulmonic valve was normal in structure.  7. No evidence of left ventricular regional wall motion abnormalities.  8. Right atrial pressure is estimated at 3 mmHg. (Comparison LVEF: 30-35% 03/21/17; 20-25% 11/20/16 & 10/05/15)    RHC/LHC 03/21/17: Findings:  Ao = 125/72 (93) LV = 116/5 RA = 2 RV = 32/1 PA = 30/9 (18) PCW = 10 Fick cardiac output/index = 4.6/2.5 PVR = 1.7 WU SVR = 1536 FA sat =  90% PA sat = 57%, 58%  Assessment: 1. Normal coronary  arteries 2. NICM EF 30-35% 3. Well-compensated filling pressures after diuresis Plan/Discussion: Etiology of NICM unclear. Consider cMRI. Continue medical therapy with titration of HF meds.    Cardiac MRI 12/22/15 ([facility]) did not show evidence of myocardial infarction, scar, or infiltrative disease.   Past Medical History:  Diagnosis Date  . Abdominal pain, epigastric 10/06/2015  . Acid reflux   . Acute on chronic systolic (congestive) heart failure (HCC) 03/19/2017  . Acute renal insufficiency 10/06/2015  . Acute respiratory failure with hypoxia (HCC) 04/26/2018  . Chronic systolic CHF (congestive heart failure) (HCC)   . CKD (chronic kidney disease), stage III (HCC)   . 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 COPD with acute bronchitis (HCC) 04/25/2018  . Dyspnea   . Elevated transaminase level 10/06/2015  . History of noncompliance with medical treatment, presenting hazards to health   . Hyperglycemia 10/06/2015  . LBBB (left bundle branch block)   . Lung nodule    a. seen on prior CT, f/u due 11/2017.  . Mitral regurgitation   .  NICM (nonischemic cardiomyopathy) ([place])    a. prior hx of this, EF 20-25% in 11/2016. b. EF 30-35% by cath 03/2017 with normal coronaries.  . Protein-calorie malnutrition, severe 03/07/2016  . Respiratory distress 04/25/2018  . Severe mitral regurgitation 10/06/2015  . Tobacco abuse     Past Surgical History:  Procedure Laterality Date  . CARDIAC CATHETERIZATION    . HERNIA REPAIR    . RIGHT/LEFT HEART CATH AND CORONARY ANGIOGRAPHY N/A 03/21/2017   Procedure: RIGHT/LEFT HEART CATH AND CORONARY ANGIOGRAPHY;  Surgeon: Jolaine Artist, MD;  Location: [facility];  Service: Cardiovascular;  Laterality: N/A;    MEDICATIONS: . acetaminophen (TYLENOL) 500 MG tablet  . albuterol (PROVENTIL HFA;VENTOLIN HFA) 108 (90 Base) MCG/ACT inhaler  . albuterol (PROVENTIL) (2.5 MG/3ML) 0.083% nebulizer solution  . aspirin EC 81 MG EC tablet  .  atorvastatin (LIPITOR) 20 MG tablet  . budesonide-formoterol (SYMBICORT) 160-4.5 MCG/ACT inhaler  . carvedilol (COREG) 12.5 MG tablet  . clopidogrel (PLAVIX) 75 MG tablet  . ENTRESTO 24-26 MG  . fluticasone (FLONASE) 50 MCG/ACT nasal spray  . furosemide (LASIX) 40 MG tablet  . Investigational - Study Medication  . pantoprazole (PROTONIX) 40 MG tablet  . spironolactone (ALDACTONE) 25 MG tablet  . umeclidinium-vilanterol (ANORO ELLIPTA) 62.5-25 MCG/INH AEPB  . Vitamin D, Ergocalciferol, (DRISDOL) 1.25 MG (50000 UT) CAPS capsule   No current facility-administered medications for this encounter.   Investigational study medication: GALACTIC STUDY: Placebo/omecamtiv mecarbil tablet BID. Omecamtiv mecarbil is a cardiac-specific myosin activator.    Myra Gianotti, PA-C Surgical Short Stay/Anesthesiology [facility] Phone [phone] [facility] Phone [phone] 09/25/2018 1:20 PM

## 2018-09-26 ENCOUNTER — Other Ambulatory Visit (HOSPITAL_COMMUNITY)
Admission: RE | Admit: 2018-09-26 | Discharge: 2018-09-26 | Disposition: A | Discharge status: Home / Self Care | Payer: Medicare HMO | Source: Ambulatory Visit | Attending: Vascular Surgery | Admitting: Vascular Surgery

## 2018-09-26 LAB — SARS CORONAVIRUS 2 (TAT 6-24 HRS): SARS Coronavirus 2: NEGATIVE

## 2018-09-29 ENCOUNTER — Other Ambulatory Visit: Payer: Self-pay

## 2018-09-29 ENCOUNTER — Inpatient Hospital Stay (HOSPITAL_COMMUNITY): Payer: Medicare HMO | Admitting: Anesthesiology

## 2018-09-29 ENCOUNTER — Inpatient Hospital Stay (HOSPITAL_COMMUNITY): Payer: Medicare HMO | Admitting: Vascular Surgery

## 2018-09-29 ENCOUNTER — Encounter (HOSPITAL_COMMUNITY): Payer: Self-pay | Admitting: *Deleted

## 2018-09-29 ENCOUNTER — Encounter (HOSPITAL_COMMUNITY)
Admission: RE | Disposition: A | Discharge status: Home / Self Care | Payer: Self-pay | Source: Home / Self Care | Attending: Vascular Surgery

## 2018-09-29 ENCOUNTER — Inpatient Hospital Stay (HOSPITAL_COMMUNITY)
Admission: RE | Admit: 2018-09-29 | Discharge: 2018-10-01 | DRG: 038 | Disposition: A | Discharge status: Home / Self Care | Payer: Medicare HMO | Attending: Vascular Surgery | Admitting: Vascular Surgery

## 2018-09-29 DIAGNOSIS — I5022 Chronic systolic (congestive) heart failure: Secondary | ICD-10-CM | POA: Diagnosis present

## 2018-09-29 DIAGNOSIS — Z7902 Long term (current) use of antithrombotics/antiplatelets: Secondary | ICD-10-CM

## 2018-09-29 DIAGNOSIS — Z79899 Other long term (current) drug therapy: Secondary | ICD-10-CM

## 2018-09-29 DIAGNOSIS — J449 Chronic obstructive pulmonary disease, unspecified: Secondary | ICD-10-CM | POA: Diagnosis present

## 2018-09-29 DIAGNOSIS — I130 Hypertensive heart and chronic kidney disease with heart failure and stage 1 through stage 4 chronic kidney disease, or unspecified chronic kidney disease: Secondary | ICD-10-CM | POA: Diagnosis present

## 2018-09-29 DIAGNOSIS — Z7951 Long term (current) use of inhaled steroids: Secondary | ICD-10-CM

## 2018-09-29 DIAGNOSIS — I420 Dilated cardiomyopathy: Secondary | ICD-10-CM | POA: Diagnosis present

## 2018-09-29 DIAGNOSIS — N183 Chronic kidney disease, stage 3 (moderate): Secondary | ICD-10-CM | POA: Diagnosis present

## 2018-09-29 DIAGNOSIS — Z7982 Long term (current) use of aspirin: Secondary | ICD-10-CM | POA: Diagnosis not present

## 2018-09-29 DIAGNOSIS — I6522 Occlusion and stenosis of left carotid artery: Secondary | ICD-10-CM | POA: Diagnosis present

## 2018-09-29 DIAGNOSIS — I6529 Occlusion and stenosis of unspecified carotid artery: Secondary | ICD-10-CM | POA: Diagnosis present

## 2018-09-29 DIAGNOSIS — F17210 Nicotine dependence, cigarettes, uncomplicated: Secondary | ICD-10-CM | POA: Diagnosis present

## 2018-09-29 DIAGNOSIS — K219 Gastro-esophageal reflux disease without esophagitis: Secondary | ICD-10-CM | POA: Diagnosis present

## 2018-09-29 HISTORY — PX: ENDARTERECTOMY: SHX5162

## 2018-09-29 HISTORY — PX: PATCH ANGIOPLASTY: SHX6230

## 2018-09-29 LAB — GLUCOSE, CAPILLARY: Glucose-Capillary: 117 mg/dL — ABNORMAL HIGH (ref 70–99)

## 2018-09-29 LAB — POCT ACTIVATED CLOTTING TIME: Activated Clotting Time: 257 seconds

## 2018-09-29 SURGERY — ENDARTERECTOMY, CAROTID
Anesthesia: General | Site: Neck | Laterality: Left

## 2018-09-29 MED ORDER — SUGAMMADEX SODIUM 200 MG/2ML IV SOLN
INTRAVENOUS | Status: DC | PRN
  Administered 2018-09-29: 200 mg via INTRAVENOUS

## 2018-09-29 MED ORDER — PHENYLEPHRINE 40 MCG/ML (10ML) SYRINGE FOR IV PUSH (FOR BLOOD PRESSURE SUPPORT)
PREFILLED_SYRINGE | INTRAVENOUS | Status: AC
  Filled 2018-09-29: qty 10

## 2018-09-29 MED ORDER — MAGNESIUM SULFATE 2 GM/50ML IV SOLN: 2.0000 g | Freq: Every day | INTRAVENOUS | Status: DC | PRN

## 2018-09-29 MED ORDER — SODIUM CHLORIDE 0.9 % IV SOLN: INTRAVENOUS | Status: DC

## 2018-09-29 MED ORDER — PROTAMINE SULFATE 10 MG/ML IV SOLN
INTRAVENOUS | Status: AC
  Filled 2018-09-29: qty 10

## 2018-09-29 MED ORDER — OXYCODONE HCL 5 MG PO TABS: 5.0000 mg | ORAL_TABLET | Freq: Once | ORAL | Status: DC | PRN

## 2018-09-29 MED ORDER — SUGAMMADEX SODIUM 500 MG/5ML IV SOLN
INTRAVENOUS | Status: AC
  Filled 2018-09-29: qty 5

## 2018-09-29 MED ORDER — ASPIRIN 81 MG PO TBEC
81.0000 mg | DELAYED_RELEASE_TABLET | Freq: Every day | ORAL | Status: DC
  Filled 2018-09-29: qty 1

## 2018-09-29 MED ORDER — SODIUM CHLORIDE 0.9 % IV SOLN: INTRAVENOUS | Status: AC

## 2018-09-29 MED ORDER — LIDOCAINE-EPINEPHRINE (PF) 1 %-1:200000 IJ SOLN
INTRAMUSCULAR | Status: DC | PRN
  Administered 2018-09-29: 30 mL

## 2018-09-29 MED ORDER — LABETALOL HCL 5 MG/ML IV SOLN: 10.0000 mg | INTRAVENOUS | Status: DC | PRN

## 2018-09-29 MED ORDER — ONDANSETRON HCL 4 MG/2ML IJ SOLN
INTRAMUSCULAR | Status: AC
  Filled 2018-09-29: qty 4

## 2018-09-29 MED ORDER — LACTATED RINGERS IV SOLN
INTRAVENOUS | Status: DC | PRN
  Administered 2018-09-29: 08:00:00 via INTRAVENOUS

## 2018-09-29 MED ORDER — LACTATED RINGERS IV SOLN
INTRAVENOUS | Status: DC
  Administered 2018-09-29: 08:00:00 via INTRAVENOUS

## 2018-09-29 MED ORDER — LIDOCAINE 2% (20 MG/ML) 5 ML SYRINGE
INTRAMUSCULAR | Status: AC
  Filled 2018-09-29: qty 10

## 2018-09-29 MED ORDER — SODIUM CHLORIDE 0.9 % IV SOLN
500.0000 mL | Freq: Once | INTRAVENOUS | Status: AC | PRN
  Administered 2018-09-29: 500 mL via INTRAVENOUS

## 2018-09-29 MED ORDER — SODIUM CHLORIDE 0.9 % IV SOLN
INTRAVENOUS | Status: DC | PRN
  Administered 2018-09-29: 500 mL

## 2018-09-29 MED ORDER — LIDOCAINE 2% (20 MG/ML) 5 ML SYRINGE
INTRAMUSCULAR | Status: AC
  Filled 2018-09-29: qty 5

## 2018-09-29 MED ORDER — EPHEDRINE 5 MG/ML INJ
INTRAVENOUS | Status: AC
  Filled 2018-09-29: qty 10

## 2018-09-29 MED ORDER — ALUM & MAG HYDROXIDE-SIMETH 200-200-20 MG/5ML PO SUSP: 15.0000 mL | ORAL | Status: DC | PRN

## 2018-09-29 MED ORDER — GLYCOPYRROLATE 0.2 MG/ML IJ SOLN
INTRAMUSCULAR | Status: DC | PRN
  Administered 2018-09-29: 0.1 mg via INTRAVENOUS

## 2018-09-29 MED ORDER — FENTANYL CITRATE (PF) 100 MCG/2ML IJ SOLN
INTRAMUSCULAR | Status: AC
  Administered 2018-09-29: 25 ug via INTRAVENOUS
  Filled 2018-09-29: qty 2

## 2018-09-29 MED ORDER — ROCURONIUM BROMIDE 50 MG/5ML IV SOSY
PREFILLED_SYRINGE | INTRAVENOUS | Status: DC | PRN
  Administered 2018-09-29: 60 mg via INTRAVENOUS
  Administered 2018-09-29: 10 mg via INTRAVENOUS

## 2018-09-29 MED ORDER — SODIUM CHLORIDE 0.9 % IV SOLN
INTRAVENOUS | Status: AC
  Filled 2018-09-29: qty 1.2

## 2018-09-29 MED ORDER — OXYCODONE-ACETAMINOPHEN 5-325 MG PO TABS
ORAL_TABLET | ORAL | Status: AC
  Filled 2018-09-29: qty 1

## 2018-09-29 MED ORDER — PANTOPRAZOLE SODIUM 40 MG PO TBEC
40.0000 mg | DELAYED_RELEASE_TABLET | Freq: Every day | ORAL | Status: DC
  Administered 2018-09-30 – 2018-10-01 (×2): 40 mg via ORAL
  Filled 2018-09-29 (×2): qty 1

## 2018-09-29 MED ORDER — ACETAMINOPHEN 325 MG PO TABS
325.0000 mg | ORAL_TABLET | ORAL | Status: DC | PRN
  Administered 2018-09-29 – 2018-09-30 (×3): 650 mg via ORAL
  Filled 2018-09-29 (×3): qty 2

## 2018-09-29 MED ORDER — 0.9 % SODIUM CHLORIDE (POUR BTL) OPTIME
TOPICAL | Status: DC | PRN
  Administered 2018-09-29: 09:00:00 2000 mL

## 2018-09-29 MED ORDER — LIDOCAINE-EPINEPHRINE (PF) 1 %-1:200000 IJ SOLN
INTRAMUSCULAR | Status: AC
  Filled 2018-09-29: qty 30

## 2018-09-29 MED ORDER — DEXTRAN 40 IN SALINE 10-0.9 % IV SOLN
INTRAVENOUS | Status: AC | PRN
  Administered 2018-09-29: 500 mL

## 2018-09-29 MED ORDER — OXYCODONE HCL 5 MG/5ML PO SOLN: 5.0000 mg | Freq: Once | ORAL | Status: DC | PRN

## 2018-09-29 MED ORDER — SACUBITRIL-VALSARTAN 24-26 MG PO TABS
1.0000 | ORAL_TABLET | Freq: Every day | ORAL | Status: DC
  Administered 2018-10-01: 1 via ORAL
  Filled 2018-09-29: qty 1

## 2018-09-29 MED ORDER — ONDANSETRON HCL 4 MG/2ML IJ SOLN: 4.0000 mg | Freq: Four times a day (QID) | INTRAMUSCULAR | Status: DC | PRN

## 2018-09-29 MED ORDER — GLYCOPYRROLATE PF 0.2 MG/ML IJ SOSY
PREFILLED_SYRINGE | INTRAMUSCULAR | Status: AC
  Filled 2018-09-29: qty 1

## 2018-09-29 MED ORDER — CARVEDILOL 12.5 MG PO TABS
12.5000 mg | ORAL_TABLET | Freq: Two times a day (BID) | ORAL | Status: DC
  Administered 2018-09-30 – 2018-10-01 (×3): 12.5 mg via ORAL
  Filled 2018-09-29 (×5): qty 1

## 2018-09-29 MED ORDER — FENTANYL CITRATE (PF) 100 MCG/2ML IJ SOLN
25.0000 ug | INTRAMUSCULAR | Status: DC | PRN
  Administered 2018-09-29: 25 ug via INTRAVENOUS

## 2018-09-29 MED ORDER — DEXTRAN 40 IN SALINE 10-0.9 % IV SOLN
INTRAVENOUS | Status: AC
  Filled 2018-09-29: qty 500

## 2018-09-29 MED ORDER — FUROSEMIDE 40 MG PO TABS
40.0000 mg | ORAL_TABLET | Freq: Every day | ORAL | Status: DC
  Administered 2018-09-29 – 2018-10-01 (×3): 40 mg via ORAL
  Filled 2018-09-29 (×3): qty 1

## 2018-09-29 MED ORDER — LIDOCAINE 2% (20 MG/ML) 5 ML SYRINGE
INTRAMUSCULAR | Status: DC | PRN
  Administered 2018-09-29: 40 mg via INTRAVENOUS
  Administered 2018-09-29: 60 mg via INTRAVENOUS

## 2018-09-29 MED ORDER — FENTANYL CITRATE (PF) 250 MCG/5ML IJ SOLN
INTRAMUSCULAR | Status: AC
  Filled 2018-09-29: qty 5

## 2018-09-29 MED ORDER — ACETAMINOPHEN 10 MG/ML IV SOLN: 1000.0000 mg | Freq: Once | INTRAVENOUS | Status: DC | PRN

## 2018-09-29 MED ORDER — GUAIFENESIN-DM 100-10 MG/5ML PO SYRP: 15.0000 mL | ORAL_SOLUTION | ORAL | Status: DC | PRN

## 2018-09-29 MED ORDER — MORPHINE SULFATE (PF) 2 MG/ML IV SOLN
2.0000 mg | INTRAVENOUS | Status: DC | PRN
  Administered 2018-09-30: 09:00:00 2 mg via INTRAVENOUS
  Filled 2018-09-29: qty 1

## 2018-09-29 MED ORDER — DEXAMETHASONE SODIUM PHOSPHATE 10 MG/ML IJ SOLN
INTRAMUSCULAR | Status: DC | PRN
  Administered 2018-09-29: 5 mg via INTRAVENOUS

## 2018-09-29 MED ORDER — DOCUSATE SODIUM 100 MG PO CAPS
100.0000 mg | ORAL_CAPSULE | Freq: Every day | ORAL | Status: DC
  Administered 2018-09-30 – 2018-10-01 (×2): 100 mg via ORAL
  Filled 2018-09-29 (×2): qty 1

## 2018-09-29 MED ORDER — ACETAMINOPHEN 325 MG RE SUPP
325.0000 mg | RECTAL | Status: DC | PRN
  Filled 2018-09-29: qty 2

## 2018-09-29 MED ORDER — POTASSIUM CHLORIDE CRYS ER 20 MEQ PO TBCR: 20.0000 meq | EXTENDED_RELEASE_TABLET | Freq: Every day | ORAL | Status: DC | PRN

## 2018-09-29 MED ORDER — PHENYLEPHRINE HCL (PRESSORS) 10 MG/ML IV SOLN
INTRAVENOUS | Status: DC | PRN
  Administered 2018-09-29 (×2): 80 ug via INTRAVENOUS

## 2018-09-29 MED ORDER — ACETAMINOPHEN 500 MG PO TABS: 1000.0000 mg | ORAL_TABLET | Freq: Once | ORAL | Status: DC | PRN

## 2018-09-29 MED ORDER — THROMBIN 20000 UNITS EX SOLR
CUTANEOUS | Status: DC | PRN
  Administered 2018-09-29: 11:00:00 20 mL via TOPICAL

## 2018-09-29 MED ORDER — CLOPIDOGREL BISULFATE 75 MG PO TABS
75.0000 mg | ORAL_TABLET | Freq: Every day | ORAL | Status: DC
  Administered 2018-09-30 – 2018-10-01 (×2): 75 mg via ORAL
  Filled 2018-09-29 (×2): qty 1

## 2018-09-29 MED ORDER — ATORVASTATIN CALCIUM 10 MG PO TABS
20.0000 mg | ORAL_TABLET | Freq: Every day | ORAL | Status: DC
  Administered 2018-09-29 – 2018-09-30 (×2): 20 mg via ORAL
  Filled 2018-09-29 (×3): qty 2

## 2018-09-29 MED ORDER — SODIUM CHLORIDE 0.9 % IV SOLN
INTRAVENOUS | Status: DC | PRN
  Administered 2018-09-29: 50 ug/min via INTRAVENOUS

## 2018-09-29 MED ORDER — PROPOFOL 10 MG/ML IV BOLUS
INTRAVENOUS | Status: DC | PRN
  Administered 2018-09-29: 120 mg via INTRAVENOUS

## 2018-09-29 MED ORDER — LIDOCAINE HCL (PF) 1 % IJ SOLN
INTRAMUSCULAR | Status: AC
  Filled 2018-09-29: qty 5

## 2018-09-29 MED ORDER — ASPIRIN EC 81 MG PO TBEC
81.0000 mg | DELAYED_RELEASE_TABLET | Freq: Every day | ORAL | Status: DC
  Administered 2018-09-29 – 2018-10-01 (×3): 81 mg via ORAL
  Filled 2018-09-29 (×3): qty 1

## 2018-09-29 MED ORDER — PROPOFOL 10 MG/ML IV BOLUS
INTRAVENOUS | Status: AC
  Filled 2018-09-29: qty 20

## 2018-09-29 MED ORDER — PHENOL 1.4 % MT LIQD: 1.0000 | OROMUCOSAL | Status: DC | PRN

## 2018-09-29 MED ORDER — ALBUTEROL SULFATE HFA 108 (90 BASE) MCG/ACT IN AERS: 1.0000 | INHALATION_SPRAY | Freq: Four times a day (QID) | RESPIRATORY_TRACT | Status: DC | PRN

## 2018-09-29 MED ORDER — SPIRONOLACTONE 25 MG PO TABS
25.0000 mg | ORAL_TABLET | Freq: Every day | ORAL | Status: DC
  Administered 2018-09-29 – 2018-10-01 (×3): 25 mg via ORAL
  Filled 2018-09-29 (×3): qty 1

## 2018-09-29 MED ORDER — OXYCODONE-ACETAMINOPHEN 5-325 MG PO TABS
1.0000 | ORAL_TABLET | ORAL | Status: DC | PRN
  Administered 2018-09-29: 1 via ORAL
  Administered 2018-09-29 – 2018-10-01 (×6): 2 via ORAL
  Filled 2018-09-29 (×6): qty 2

## 2018-09-29 MED ORDER — ONDANSETRON HCL 4 MG/2ML IJ SOLN
INTRAMUSCULAR | Status: DC | PRN
  Administered 2018-09-29: 4 mg via INTRAVENOUS

## 2018-09-29 MED ORDER — ACETAMINOPHEN 500 MG PO TABS: 1000.0000 mg | ORAL_TABLET | Freq: Every day | ORAL | Status: DC | PRN

## 2018-09-29 MED ORDER — FENTANYL CITRATE (PF) 100 MCG/2ML IJ SOLN
INTRAMUSCULAR | Status: DC | PRN
  Administered 2018-09-29: 25 ug via INTRAVENOUS
  Administered 2018-09-29 (×2): 50 ug via INTRAVENOUS

## 2018-09-29 MED ORDER — ACETAMINOPHEN 160 MG/5ML PO SOLN: 1000.0000 mg | Freq: Once | ORAL | Status: DC | PRN

## 2018-09-29 MED ORDER — HEPARIN SODIUM (PORCINE) 1000 UNIT/ML IJ SOLN
INTRAMUSCULAR | Status: DC | PRN
  Administered 2018-09-29: 9000 [IU] via INTRAVENOUS

## 2018-09-29 MED ORDER — THROMBIN (RECOMBINANT) 20000 UNITS EX SOLR
CUTANEOUS | Status: AC
  Filled 2018-09-29: qty 20000

## 2018-09-29 MED ORDER — ROCURONIUM BROMIDE 10 MG/ML (PF) SYRINGE
PREFILLED_SYRINGE | INTRAVENOUS | Status: AC
  Filled 2018-09-29: qty 10

## 2018-09-29 MED ORDER — HYDRALAZINE HCL 20 MG/ML IJ SOLN: 5.0000 mg | INTRAMUSCULAR | Status: DC | PRN

## 2018-09-29 MED ORDER — METOPROLOL TARTRATE 5 MG/5ML IV SOLN: 2.0000 mg | INTRAVENOUS | Status: DC | PRN

## 2018-09-29 MED ORDER — CEFAZOLIN SODIUM-DEXTROSE 2-4 GM/100ML-% IV SOLN
2.0000 g | INTRAVENOUS | Status: AC
  Administered 2018-09-29: 2 g via INTRAVENOUS
  Filled 2018-09-29: qty 100

## 2018-09-29 MED ORDER — PROTAMINE SULFATE 10 MG/ML IV SOLN
INTRAVENOUS | Status: DC | PRN
  Administered 2018-09-29: 20 mg via INTRAVENOUS
  Administered 2018-09-29: 10 mg via INTRAVENOUS

## 2018-09-29 MED ORDER — CEFAZOLIN SODIUM-DEXTROSE 2-4 GM/100ML-% IV SOLN
2.0000 g | Freq: Three times a day (TID) | INTRAVENOUS | Status: AC
  Administered 2018-09-29 – 2018-09-30 (×2): 2 g via INTRAVENOUS
  Filled 2018-09-29 (×3): qty 100

## 2018-09-29 MED ORDER — EPHEDRINE SULFATE 50 MG/ML IJ SOLN
INTRAMUSCULAR | Status: DC | PRN
  Administered 2018-09-29 (×5): 5 mg via INTRAVENOUS

## 2018-09-29 MED ORDER — ALBUTEROL SULFATE (2.5 MG/3ML) 0.083% IN NEBU: 2.5000 mg | INHALATION_SOLUTION | Freq: Four times a day (QID) | RESPIRATORY_TRACT | Status: DC | PRN

## 2018-09-29 SURGICAL SUPPLY — 42 items
BAG DECANTER FOR FLEXI CONT (MISCELLANEOUS) ×3 IMPLANT
CANISTER SUCT 3000ML PPV (MISCELLANEOUS) ×3 IMPLANT
CANNULA VESSEL 3MM 2 BLNT TIP (CANNULA) ×9 IMPLANT
CATH ROBINSON RED A/P 18FR (CATHETERS) ×3 IMPLANT
CLIP VESOCCLUDE MED 24/CT (CLIP) ×3 IMPLANT
CLIP VESOCCLUDE SM WIDE 24/CT (CLIP) ×3 IMPLANT
COVER WAND RF STERILE (DRAPES) ×3 IMPLANT
DERMABOND ADVANCED (GAUZE/BANDAGES/DRESSINGS) ×2
DERMABOND ADVANCED .7 DNX12 (GAUZE/BANDAGES/DRESSINGS) ×1 IMPLANT
DRAIN CHANNEL 15F RND FF W/TCR (WOUND CARE) IMPLANT
ELECT REM PT RETURN 9FT ADLT (ELECTROSURGICAL) ×3
ELECTRODE REM PT RTRN 9FT ADLT (ELECTROSURGICAL) ×1 IMPLANT
EVACUATOR SILICONE 100CC (DRAIN) IMPLANT
GLOVE BIO SURGEON STRL SZ7.5 (GLOVE) ×5 IMPLANT
GLOVE BIOGEL PI IND STRL 8 (GLOVE) ×1 IMPLANT
GLOVE BIOGEL PI INDICATOR 8 (GLOVE) ×2
GOWN STRL REUS W/ TWL LRG LVL3 (GOWN DISPOSABLE) ×3 IMPLANT
GOWN STRL REUS W/ TWL XL LVL3 (GOWN DISPOSABLE) IMPLANT
GOWN STRL REUS W/TWL LRG LVL3 (GOWN DISPOSABLE) ×4
GOWN STRL REUS W/TWL XL LVL3 (GOWN DISPOSABLE) ×2
GRAFT VASC PATCH XENOSURE 1X14 (Vascular Products) ×2 IMPLANT
KIT BASIN OR (CUSTOM PROCEDURE TRAY) ×3 IMPLANT
KIT SHUNT ARGYLE CAROTID ART 6 (VASCULAR PRODUCTS) IMPLANT
KIT TURNOVER KIT B (KITS) ×3 IMPLANT
NDL HYPO 25GX1X1/2 BEV (NEEDLE) ×1 IMPLANT
NEEDLE HYPO 25GX1X1/2 BEV (NEEDLE) ×3 IMPLANT
NS IRRIG 1000ML POUR BTL (IV SOLUTION) ×6 IMPLANT
PACK CAROTID (CUSTOM PROCEDURE TRAY) ×3 IMPLANT
PAD ARMBOARD 7.5X6 YLW CONV (MISCELLANEOUS) ×6 IMPLANT
POSITIONER HEAD DONUT 9IN (MISCELLANEOUS) ×3 IMPLANT
SET WALTER ACTIVATION W/DRAPE (SET/KITS/TRAYS/PACK) ×2 IMPLANT
SHUNT CAROTID BYPASS 10 (VASCULAR PRODUCTS) ×2 IMPLANT
SPONGE SURGIFOAM ABS GEL 100 (HEMOSTASIS) ×2 IMPLANT
SUT PROLENE 6 0 BV (SUTURE) ×6 IMPLANT
SUT SILK 2 0 PERMA HAND 18 BK (SUTURE) IMPLANT
SUT VIC AB 3-0 SH 27 (SUTURE) ×2
SUT VIC AB 3-0 SH 27X BRD (SUTURE) ×1 IMPLANT
SUT VICRYL 4-0 PS2 18IN ABS (SUTURE) ×3 IMPLANT
SYR 20ML LL LF (SYRINGE) ×3 IMPLANT
SYR CONTROL 10ML LL (SYRINGE) ×3 IMPLANT
TOWEL GREEN STERILE (TOWEL DISPOSABLE) ×3 IMPLANT
WATER STERILE IRR 1000ML POUR (IV SOLUTION) ×3 IMPLANT

## 2018-09-29 NOTE — Progress Notes (Signed)
  Day of Surgery Note    Subjective:  Says she's a little sore   Vitals:   09/29/18 1143 09/29/18 1145  BP:  124/65  Pulse: 85 85  Resp: 19 (!) 22  Temp:  (!) 97.2 F (36.2 C)  SpO2: 94% 94%    Incisions:   Clean and dry without hematoma Extremities:  Moving all extremities equally Lungs:  Non labored Neuro:  In tact; tongue is midline   Assessment/Plan:  This is a 68 y.o. female who is s/p  Left carotid endarterectomy  -pt doing well in recovery -neuro in tact; tongue is midline -to 4 east later today -if she does okay tonight, anticipate discharge tomorrow   Leontine Locket, PA-C 09/29/2018 12:00 PM [phone]

## 2018-09-29 NOTE — Interval H&P Note (Signed)
History and Physical Interval Note:  09/29/2018 9:05 AM  Barbara Oliver  has presented today for surgery, with the diagnosis of left carotid stenosis.  The various methods of treatment have been discussed with the patient and family. After consideration of risks, benefits and other options for treatment, the patient has consented to  Procedure(s): ENDARTERECTOMY CAROTID (Left) as a surgical intervention.  The patient's history has been reviewed, patient examined, no change in status, stable for surgery.  I have reviewed the patient's chart and labs.  Questions were answered to the patient's satisfaction.     [name]

## 2018-09-29 NOTE — Transfer of Care (Signed)
Immediate Anesthesia Transfer of Care Note  Patient: Barbara Oliver  Procedure(s) Performed: ENDARTERECTOMY CAROTID ARTERY LEFT (Left Neck) Patch Angioplasty Left Carotid Artery using Xenosure Biologic Patch (Left Neck)  Patient Location: PACU  Anesthesia Type:General  Level of Consciousness: awake, alert  and oriented  Airway & Oxygen Therapy: Patient Spontanous Breathing and Patient connected to face mask oxygen  Post-op Assessment: Report given to RN, Post -op Vital signs reviewed and stable and Patient moving all extremities X 4  Post vital signs: Reviewed and stable  Last Vitals:  Vitals Value Taken Time  BP 124/65 09/29/18 1143  Temp    Pulse 85 09/29/18 1145  Resp 22 09/29/18 1145  SpO2 94 % 09/29/18 1145  Vitals shown include unvalidated device data.  Last Pain:  Vitals:   09/29/18 0731  PainSc: 0-No pain      Patients Stated Pain Goal: 5 (29/56/21 3086)  Complications: No apparent anesthesia complications

## 2018-09-29 NOTE — Discharge Instructions (Signed)
   [facility]  Discharge Instructions   Carotid Endarterectomy (CEA)  Please refer to the following instructions for your post-procedure care. Your surgeon or physician assistant will discuss any changes with you.  Activity  You are encouraged to walk as much as you can. You can slowly return to normal activities but must avoid strenuous activity and heavy lifting until your doctor tell you it's OK. Avoid activities such as vacuuming or swinging a golf club. You can drive after one week if you are comfortable and you are no longer taking prescription pain medications. It is normal to feel tired for serval weeks after your surgery. It is also normal to have difficulty with sleep habits, eating, and bowel movements after surgery. These will go away with time.  Bathing/Showering  You may shower after you come home. Do not soak in a bathtub, hot tub, or swim until the incision heals completely.  Incision Care  Shower every day. Clean your incision with mild soap and water. Pat the area dry with a clean towel. You do not need a bandage unless otherwise instructed. Do not apply any ointments or creams to your incision. You may have skin glue on your incision. Do not peel it off. It will come off on its own in about one week. Your incision may feel thickened and raised for several weeks after your surgery. This is normal and the skin will soften over time. For Men Only: It's OK to shave around the incision but do not shave the incision itself for 2 weeks. It is common to have numbness under your chin that could last for several months.  Diet  Resume your normal diet. There are no special food restrictions following this procedure. A low fat/low cholesterol diet is recommended for all patients with vascular disease. In order to heal from your surgery, it is CRITICAL to get adequate nutrition. Your body requires vitamins, minerals, and protein. Vegetables are the best  source of vitamins and minerals. Vegetables also provide the perfect balance of protein. Processed food has little nutritional value, so try to avoid this.        Medications  Resume taking all of your medications unless your doctor or physician assistant tells you not to. If your incision is causing pain, you may take over-the- counter pain relievers such as acetaminophen (Tylenol). If you were prescribed a stronger pain medication, please be aware these medications can cause nausea and constipation. Prevent nausea by taking the medication with a snack or meal. Avoid constipation by drinking plenty of fluids and eating foods with a high amount of fiber, such as fruits, vegetables, and grains. Do not take Tylenol if you are taking prescription pain medications.  Follow Up  Our office will schedule a follow up appointment 2-3 weeks following discharge.  Please call us immediately for any of the following conditions  Increased pain, redness, drainage (pus) from your incision site. Fever of 101 degrees or higher. If you should develop stroke (slurred speech, difficulty swallowing, weakness on one side of your body, loss of vision) you should call 911 and go to the nearest emergency room.  Reduce your risk of vascular disease:  Stop smoking. If you would like help call [facility] at 1-800-QUIT-NOW (1-800-784-8669) or [place] at 336-586-4000. Manage your cholesterol Maintain a desired weight Control your diabetes Keep your blood pressure down  If you have any questions, please call the office at [phone].   

## 2018-09-29 NOTE — Plan of Care (Signed)
Poc progressing.  

## 2018-09-29 NOTE — Op Note (Signed)
° ° °  NAME: Barbara Oliver    MRN: [id number] DOB: 1950/06/07    DATE OF OPERATION: 09/29/2018  PREOP DIAGNOSIS:    Asymptomatic greater than 80% left carotid stenosis  POSTOP DIAGNOSIS:    Same   PROCEDURE:    Left carotid endarterectomy with bovine pericardial patch angioplasty  SURGEON: Judeth Cornfield. Scot Dock, MD, FACS  ASSIST: Arlee Muslim, PA  ANESTHESIA: General  EBL: Minimal  INDICATIONS:    Barbara Oliver is a 68 y.o. female who is being followed with carotid disease.  The stenosis on the left progressed to greater than 80%.  Left carotid endarterectomy was recommended in order to lower her risk of future stroke.  FINDINGS:   90% left carotid stenosis  TECHNIQUE:   The patient was taken to the operating room and received a general anesthetic.  After careful positioning, the left neck was prepped and draped in the usual sterile fashion.  An incision was made along the anterior border of the sternocleidomastoid and the dissection carried down to the common carotid artery which was dissected free and controlled with Rummel tourniquet.  The facial vein was divided between 2-0 silk ties.  The internal and external carotid arteries were controlled after the patient was heparinized.  The superior thyroid artery was controlled.  Of note the bifurcation was slightly high in the artery rotated laterally and posterior making the dissection more difficult superiorly.  This reason we used the retractor system which allowed better exposure distally.  Clamps were then placed on the internal and the external and the common carotid artery.  A longitudinal arteriotomy was made in the common carotid artery.  This was extended through the plaque into the internal carotid artery.  A 10 shunt was placed into the internal carotid artery, backbled and then placed into the common carotid artery and secured with Rummel tourniquet.  Flow was reestablished to the shunt and check with the Doppler.   Endarterectomy plane was established proximally and the plaque was sharply divided.  Eversion endarterectomy was performed of the external carotid artery.  Distally the plaque was somewhat stuck but I was able to find a nice endpoint.  No tacking sutures were required.  The artery was irrigated with copious amounts of heparin and dextran and all loose debris removed.  A bovine pericardial patch was then sewn using continuous 6-0 Prolene suture.  Prior to completing the patch closure the shunt was removed.  The anastomosis was completed.  Flow was reestablished first to the external carotid artery and then to the internal carotid artery.  There was good Doppler signal distal to the patch and good diastolic flow.  Hemostasis was obtained in the wound.  The wound was then closed the deep layer of 3-0 Vicryl, the platysma was closed with running 3-0 Vicryl and the skin closed with 4-0 Vicryl.  Dermabond was applied.  Patient tolerated the procedure well and awoke neurologically intact.  All needle and sponge counts were correct.  She was transferred to recovery room in stable condition.  [name], MD, FACS [facility]  DATE OF DICTATION:   09/29/2018

## 2018-09-29 NOTE — Anesthesia Procedure Notes (Signed)
Arterial Line Insertion Start/End7/21/2020 8:00 AM, 09/29/2018 8:06 AM Performed by: Wilburn Cornelia, CRNA, CRNA  Patient location: Pre-op. Preanesthetic checklist: patient identified, IV checked, site marked, risks and benefits discussed, surgical consent, monitors and equipment checked, pre-op evaluation, timeout performed and anesthesia consent Lidocaine 1% used for infiltration Right, radial was placed Catheter size: 20 G Hand hygiene performed , maximum sterile barriers used  and Seldinger technique used Allen's test indicative of satisfactory collateral circulation Attempts: 1 Procedure performed without using ultrasound guided technique. Following insertion, Biopatch and dressing applied. Post procedure assessment: normal  Patient tolerated the procedure well with no immediate complications.

## 2018-09-29 NOTE — Anesthesia Procedure Notes (Signed)
Procedure Name: Intubation Date/Time: 09/29/2018 9:25 AM Performed by: Neldon Newport, CRNA Pre-anesthesia Checklist: Timeout performed, Patient being monitored, Suction available, Emergency Drugs available and Patient identified Patient Re-evaluated:Patient Re-evaluated prior to induction Oxygen Delivery Method: Circle system utilized Preoxygenation: Pre-oxygenation with 100% oxygen Induction Type: IV induction Ventilation: Mask ventilation without difficulty Laryngoscope Size: Mac and 4 Grade View: Grade I Tube type: Oral Tube size: 7.0 mm Number of attempts: 1 Placement Confirmation: breath sounds checked- equal and bilateral,  positive ETCO2 and ETT inserted through vocal cords under direct vision Secured at: 23 cm Tube secured with: Tape Dental Injury: Teeth and Oropharynx as per pre-operative assessment

## 2018-09-30 ENCOUNTER — Encounter (HOSPITAL_COMMUNITY): Payer: Self-pay | Admitting: Vascular Surgery

## 2018-09-30 LAB — CBC
HCT: 31.5 % — ABNORMAL LOW (ref 36.0–46.0)
Hemoglobin: 9.9 g/dL — ABNORMAL LOW (ref 12.0–15.0)
MCH: 28.9 pg (ref 26.0–34.0)
MCHC: 31.4 g/dL (ref 30.0–36.0)
MCV: 91.8 fL (ref 80.0–100.0)
Platelets: 283 10*3/uL (ref 150–400)
RBC: 3.43 MIL/uL — ABNORMAL LOW (ref 3.87–5.11)
RDW: 13.4 % (ref 11.5–15.5)
WBC: 8.8 10*3/uL (ref 4.0–10.5)
nRBC: 0 % (ref 0.0–0.2)

## 2018-09-30 LAB — BASIC METABOLIC PANEL
Anion gap: 7 (ref 5–15)
BUN: 20 mg/dL (ref 8–23)
CO2: 23 mmol/L (ref 22–32)
Calcium: 8.9 mg/dL (ref 8.9–10.3)
Chloride: 105 mmol/L (ref 98–111)
Creatinine, Ser: 1.29 mg/dL — ABNORMAL HIGH (ref 0.44–1.00)
GFR calc Af Amer: 50 mL/min — ABNORMAL LOW (ref >60)
GFR calc non Af Amer: 43 mL/min — ABNORMAL LOW (ref >60)
Glucose, Bld: 98 mg/dL (ref 70–99)
Potassium: 4 mmol/L (ref 3.5–5.1)
Sodium: 135 mmol/L (ref 135–145)

## 2018-09-30 MED ORDER — OXYCODONE-ACETAMINOPHEN 5-325 MG PO TABS: 1.0000 | ORAL_TABLET | Freq: Four times a day (QID) | ORAL | 0 refills | Status: DC | PRN

## 2018-09-30 MED FILL — Thrombin (Recombinant) For Soln 20000 Unit: CUTANEOUS | Qty: 1 | Status: AC

## 2018-09-30 NOTE — Anesthesia Postprocedure Evaluation (Signed)
Anesthesia Post Note  Patient: Barbara Oliver  Procedure(s) Performed: ENDARTERECTOMY CAROTID ARTERY LEFT (Left Neck) Patch Angioplasty Left Carotid Artery using Xenosure Biologic Patch (Left Neck)     Patient location during evaluation: PACU Anesthesia Type: General Level of consciousness: awake and alert Pain management: pain level controlled Vital Signs Assessment: post-procedure vital signs reviewed and stable Respiratory status: spontaneous breathing, nonlabored ventilation, respiratory function stable and patient connected to nasal cannula oxygen Cardiovascular status: blood pressure returned to baseline and stable Postop Assessment: no apparent nausea or vomiting Anesthetic complications: no    Last Vitals:  Vitals:   09/30/18 0528 09/30/18 0852  BP: (!) 108/56 130/63  Pulse: 81 79  Resp: 16 16  Temp: 36.8 C 36.7 C  SpO2: 95% 100%    Last Pain:  Vitals:   09/30/18 0852  TempSrc: Oral  PainSc: 7                  Deirdre Gryder

## 2018-09-30 NOTE — Discharge Summary (Addendum)
Discharge Summary     TING CAGE 06-27-1950 68 y.o. female  [id number]  Admission Date: 09/29/2018  Discharge Date: 10/01/2018  Physician: Angelia Mould, *  Admission Diagnosis: left carotid stenosis   HPI:   This is a 68 y.o. female patient has an asymptomatic greater than 80% left carotid stenosis.  I recommended left carotid endarterectomy in order to lower her risk of future stroke.  She has another scheduling issue and therefore this has not been scheduled till 09/29/2018.  She knows to continue her aspirin through surgery. I have reviewed the indications for carotid endarterectomy, that is to lower the risk of future stroke. I have also reviewed the potential complications of surgery, including but not limited to: bleeding, stroke (perioperative risk 1-2%), MI, nerve injury of other unpredictable medical problems. All of the patients questions were answered and they are agreeable to proceed with surgery.   We did also discuss the importance of tobacco cessation.  Hospital Course:  The patient was admitted to the hospital and taken to the operating room on 09/29/2018 and underwent left carotid endarterectomy.    Findings: 90% left carotid stenosis  The pt tolerated the procedure well and was transported to the PACU in good condition.   By POD 1, the pt neuro status was in tact.  Pt doing well and discharged home.  Her discharge was put on hold as she was a little dizzy with walking.    By POD 2, she was doing well.  Her dizziness resolved.  She is discharged home.   The remainder of the hospital course consisted of increasing mobilization and increasing intake of solids without difficulty.   Recent Labs    09/30/18 0500  NA 135  K 4.0  CL 105  CO2 23  GLUCOSE 98  BUN 20  CALCIUM 8.9   Recent Labs    09/30/18 0500  WBC 8.8  HGB 9.9*  HCT 31.5*  PLT 283   No results for input(s): INR in the last 72 hours.   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[place]) 04/25/2018   History of noncompliance with medical treatment, presenting hazards to health    CKD (chronic kidney disease), stage III ([place])    LBBB (left bundle branch block)    NICM (nonischemic cardiomyopathy) ([place])    Mitral regurgitation    Tobacco abuse    Lung nodule    Acute on chronic systolic (congestive) heart failure ([place]) 03/19/2017   Protein-calorie malnutrition, severe 03/07/2016   Abdominal pain, epigastric 10/06/2015   Elevated transaminase level 10/06/2015   Acute renal insufficiency 10/06/2015   Hyperglycemia 10/06/2015   Severe mitral regurgitation 10/06/2015   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   Past Medical History:  Diagnosis Date   Abdominal pain, epigastric 10/06/2015   Acid reflux    Acute on chronic systolic (congestive) heart failure ([place]) 03/19/2017   Acute renal insufficiency 10/06/2015   Acute respiratory failure with hypoxia ([facility]) 9/50/9326   Chronic systolic CHF (congestive heart failure) ([facility])    CKD (chronic kidney  disease), stage III ([facility])    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   COPD with acute bronchitis ([facility]) 04/25/2018   Dyspnea    Elevated transaminase level 10/06/2015   History of noncompliance with medical treatment, presenting hazards to health    Hyperglycemia 10/06/2015   LBBB (left bundle branch block)    Lung nodule    a. seen on prior CT, f/u due 11/2017.   Mitral regurgitation    NICM (nonischemic cardiomyopathy) ([facility])      a. prior hx of this, EF 20-25% in 11/2016. b. EF 30-35% by cath 03/2017 with normal coronaries.   Protein-calorie malnutrition, severe 03/07/2016   Respiratory distress 04/25/2018   Severe mitral regurgitation 10/06/2015   Tobacco abuse     Allergies as of 09/30/2018   No Known Allergies     Medication List    TAKE these medications   acetaminophen 500 MG tablet Commonly known as: TYLENOL Take 1,000 mg by mouth daily as needed for mild pain.   albuterol 108 (90 Base) MCG/ACT inhaler Commonly known as: VENTOLIN HFA Inhale 1-2 puffs into the lungs every 6 (six) hours as needed for wheezing or shortness of breath.   albuterol (2.5 MG/3ML) 0.083% nebulizer solution Commonly known as: PROVENTIL   Anoro Ellipta 62.5-25 MCG/INH Aepb Generic drug: umeclidinium-vilanterol Inhale 1 puff into the lungs daily as needed (SOB).   aspirin 81 MG EC tablet Take 1 tablet (81 mg total) by mouth daily.   atorvastatin 20 MG tablet Commonly known as: LIPITOR Take 20 mg by mouth daily.   budesonide-formoterol 160-4.5 MCG/ACT inhaler Commonly known as: SYMBICORT Inhale 2 puffs into the lungs 2 (two) times daily as needed (SOB).   carvedilol 12.5 MG tablet Commonly known as: COREG Take 12.5 mg by mouth 2 (two) times daily with a meal.   clopidogrel 75 MG tablet Commonly known as: PLAVIX Take 1 tablet by mouth daily.   Entresto 24-26 MG Generic drug: sacubitril-valsartan Take 1 tablet by mouth daily.   fluticasone 50 MCG/ACT nasal spray Commonly known as: FLONASE Place 1 spray into both nostrils daily.   furosemide 40 MG tablet Commonly known as: LASIX TAKE 1 TABLET BY MOUTH DAILY.   Investigational - Study Medication Study name: GALACTIC Study - Placebo / omecamtiv mecarbil tablet Take 1 tablet by mouth twice daily   oxyCODONE-acetaminophen 5-325 MG tablet Commonly known as: Percocet Take 1 tablet by mouth every 6 (six) hours as needed for severe pain.   pantoprazole 40  MG tablet Commonly known as: PROTONIX Take 1 tablet (40 mg total) by mouth daily.   spironolactone 25 MG tablet Commonly known as: ALDACTONE TAKE 1 TABLET BY MOUTH DAILY.   Vitamin D (Ergocalciferol) 1.25 MG (50000 UT) Caps capsule Commonly known as: DRISDOL Take 50,000 Units by mouth every 7 (seven) days.        [facility] Discharge Instructions Carotid Endarterectomy (CEA)  Please refer to the following instructions for your post-procedure care. Your surgeon or physician assistant will discuss any changes with you.  Activity  You are encouraged to walk as much as you can. You can slowly return to normal activities but must avoid strenuous activity and heavy lifting until your doctor tell you it's OK. Avoid activities such as vacuuming or swinging a golf club. You can drive after one week if you are comfortable and you are no longer taking prescription pain medications. It is normal to feel tired for serval weeks after your surgery. It is  also normal to have difficulty with sleep habits, eating, and bowel movements after surgery. These will go away with time.  Bathing/Showering  You may shower after you come home. Do not soak in a bathtub, hot tub, or swim until the incision heals completely.  Incision Care  Shower every day. Clean your incision with mild soap and water. Pat the area dry with a clean towel. You do not need a bandage unless otherwise instructed. Do not apply any ointments or creams to your incision. You may have skin glue on your incision. Do not peel it off. It will come off on its own in about one week. Your incision may feel thickened and raised for several weeks after your surgery. This is normal and the skin will soften over time. For Men Only: It's OK to shave around the incision but do not shave the incision itself for 2 weeks. It is common to have numbness under your chin that could last for several months.  Diet  Resume  your normal diet. There are no special food restrictions following this procedure. A low fat/low cholesterol diet is recommended for all patients with vascular disease. In order to heal from your surgery, it is CRITICAL to get adequate nutrition. Your body requires vitamins, minerals, and protein. Vegetables are the best source of vitamins and minerals. Vegetables also provide the perfect balance of protein. Processed food has little nutritional value, so try to avoid this.  Medications  Resume taking all of your medications unless your doctor or physician assistant tells you not to.  If your incision is causing pain, you may take over-the- counter pain relievers such as acetaminophen (Tylenol). If you were prescribed a stronger pain medication, please be aware these medications can cause nausea and constipation.  Prevent nausea by taking the medication with a snack or meal. Avoid constipation by drinking plenty of fluids and eating foods with a high amount of fiber, such as fruits, vegetables, and grains.  Do not take Tylenol if you are taking prescription pain medications.  Follow Up  Our office will schedule a follow up appointment 2-3 weeks following discharge.  Please call us immediately for any of the following conditions   Increased pain, redness, drainage (pus) from your incision site.  Fever of 101 degrees or higher.  If you should develop stroke (slurred speech, difficulty swallowing, weakness on one side of your body, loss of vision) you should call 911 and go to the nearest emergency room.   Reduce your risk of vascular disease:   Stop smoking. If you would like help call [facility] at 1-800-QUIT-NOW ((386) 027-56301-(669)617-5799) or [facility] at [phone].  Manage your cholesterol  Maintain a desired weight  Control your diabetes  Keep your blood pressure down   If you have any questions, please call the office at [phone].  Prescriptions given: 1.   Roxicet #8 No  Refill  Disposition: home  Patient's condition: is Good  Follow up: 1. Dr. Edilia Boickson in 2 weeks.   Doreatha MassedSamantha Zilah Villaflor, PA-C [facility] [phone]   --- For [facility] Registry use ---   Modified Rankin score at D/C (0-6): 0  IV medication needed for:  1. Hypertension: No 2. Hypotension: No  Post-op Complications: No  1. Post-op CVA or TIA: No  If yes: Event classification (right eye, left eye, right cortical, left cortical, verterobasilar, other): n/a  If yes: Timing of event (intra-op, <6 hrs post-op, >=6 hrs post-op, unknown): n/a  2. CN injury: No  If  yes: CN n/a injuried   3. Myocardial infarction: No  If yes: Dx by (EKG or clinical, Troponin): n/a  4.  CHF: No  5.  Dysrhythmia (new): No  6. Wound infection: No  7. Reperfusion symptoms: No  8. Return to OR: No  If yes: return to OR for (bleeding, neurologic, other CEA incision, other): n/a  Discharge medications: Statin use:  Yes ASA use:  Yes   Beta blocker use:  Yes ACE-Inhibitor use:  No  ARB use:  Yes CCB use: No P2Y12 Antagonist use: Yes, [x ] Plavix, [ ]  Plasugrel, [ ]  Ticlopinine, [ ]  Ticagrelor, [ ]  Other, [ ]  No for medical reason, [ ]  Non-compliant, [ ]  Not-indicated Anti-coagulant use:  No, [ ]  Warfarin, [ ]  Rivaroxaban, [ ]  Dabigatran,

## 2018-09-30 NOTE — Progress Notes (Signed)
Pt ambulated 700 feet without any assistive device. Stopped 3 times, and complained of slight dizziness. Now resting in side of bed. Will continue to monitor.

## 2018-09-30 NOTE — Progress Notes (Signed)
A-line from right wrist d/ced per order. Catheter tip intact. Pressure held for 11 min, pt was bleeding. Pressure dressing applied. Bilateral Radial pulses palpable. Will continue to monitor.

## 2018-09-30 NOTE — Progress Notes (Signed)
   VASCULAR SURGERY ASSESSMENT & PLAN:   POD 1 S/P Left CEA.  Doing well.  Discharge today.  VASCULAR QUALITY INITIATIVE: She is on aspirin and is on a statin.   SUBJECTIVE:   Pain adequately controlled.  PHYSICAL EXAM:   Vitals:   09/29/18 1707 09/29/18 1953 09/29/18 2000 09/30/18 0528  BP:  109/61  (!) 108/56  Pulse: 84 77  81  Resp: (!) 27 20 14 16   Temp:  98 F (36.7 C)  98.2 F (36.8 C)  TempSrc:  Oral  Oral  SpO2: 96% 99%  95%  Weight:      Height:       No focal weakness. Her left neck incision looks fine.  LABS:   Lab Results  Component Value Date   WBC 8.8 09/30/2018   HGB 9.9 (L) 09/30/2018   HCT 31.5 (L) 09/30/2018   MCV 91.8 09/30/2018   PLT 283 09/30/2018   Lab Results  Component Value Date   CREATININE 1.29 (H) 09/30/2018   Lab Results  Component Value Date   INR 1.0 09/24/2018   CBG (last 3)  Recent Labs    09/29/18 1148  GLUCAP 117*    PROBLEM LIST:    Active Problems:   Carotid artery stenosis   CURRENT MEDS:   . aspirin EC  81 mg Oral Daily  . atorvastatin  20 mg Oral q1800  . carvedilol  12.5 mg Oral BID WC  . clopidogrel  75 mg Oral Daily  . docusate sodium  100 mg Oral Daily  . furosemide  40 mg Oral Daily  . pantoprazole  40 mg Oral Daily  . [START ON 10/01/2018] sacubitril-valsartan  1 tablet Oral Daily  . spironolactone  25 mg Oral Daily    [name] Beeper: [phone] Office: [phone] 09/30/2018

## 2018-10-01 ENCOUNTER — Encounter (HOSPITAL_COMMUNITY): Payer: Self-pay | Admitting: General Practice

## 2018-10-01 NOTE — Progress Notes (Signed)
Discharge AVS meds taken today and those due this evening reviewed. Follow up appointments and when to call MD reviewed. D/C IV and TELE. Questions and concerns addressed. D/C home per orders. Tawanna Sat, RN 10/01/2018 11:25 AM

## 2018-10-01 NOTE — Progress Notes (Addendum)
  Progress Note  VASCULAR SURGERY ASSESSMENT & PLAN:   POSTOP DAY 2 STATUS POST LEFT CAROTID ENDARTERECTOMY: Patient needed an extra day as she got dizzy when walking yesterday.  She feels much better today.  For discharge today.  VASCULAR QUALITY INITIATIVE: She is on aspirin and is on a statin.  [name], MD, FACS Beeper [phone] Office: [phone]   10/01/2018 7:18 AM 2 Days Post-Op  Subjective:  Pt doing well this morning-pt out walking in the hallways  Afebrile HR 76'H-209'O NSR 709'G systolic 283% RA  Vitals:   09/30/18 2010 10/01/18 0600  BP: 124/70 128/64  Pulse: 73 68  Resp: 15 17  Temp: (!) 97.5 F (36.4 C) 98.5 F (36.9 C)  SpO2: 98% 95%     Physical Exam: Neuro:  In tact; tongue is midline Lungs:  Non labored Incision:  Clean and dry without hematoma  CBC    Component Value Date/Time   WBC 8.8 09/30/2018 0500   RBC 3.43 (L) 09/30/2018 0500   HGB 9.9 (L) 09/30/2018 0500   HGB 11.9 (L) 03/29/2014 1948   HCT 31.5 (L) 09/30/2018 0500   HCT 36.8 03/29/2014 1948   PLT 283 09/30/2018 0500   PLT 430 03/29/2014 1948   MCV 91.8 09/30/2018 0500   MCV 90 03/29/2014 1948   MCH 28.9 09/30/2018 0500   MCHC 31.4 09/30/2018 0500   RDW 13.4 09/30/2018 0500   RDW 16.0 (H) 03/29/2014 1948   LYMPHSABS 0.8 04/26/2018 0245   MONOABS 0.2 04/26/2018 0245   EOSABS 0.0 04/26/2018 0245   BASOSABS 0.0 04/26/2018 0245    BMET    Component Value Date/Time   NA 135 09/30/2018 0500   NA 142 03/31/2018 0915   NA 139 03/29/2014 1948   K 4.0 09/30/2018 0500   K 3.9 03/29/2014 1948   CL 105 09/30/2018 0500   CL 105 03/29/2014 1948   CO2 23 09/30/2018 0500   CO2 28 03/29/2014 1948   GLUCOSE 98 09/30/2018 0500   GLUCOSE 87 03/29/2014 1948   BUN 20 09/30/2018 0500   BUN 18 03/31/2018 0915   BUN 13 03/29/2014 1948   CREATININE 1.29 (H) 09/30/2018 0500   CREATININE 1.13 03/29/2014 1948   CALCIUM 8.9 09/30/2018 0500   CALCIUM 9.5 03/29/2014 1948   GFRNONAA  43 (L) 09/30/2018 0500   GFRNONAA 52 (L) 03/29/2014 1948   GFRNONAA 55 (L) 10/02/2013 0543   GFRAA 50 (L) 09/30/2018 0500   GFRAA >60 03/29/2014 1948   GFRAA >60 10/02/2013 0543     Intake/Output Summary (Last 24 hours) at 10/01/2018 0718 Last data filed at 09/30/2018 1700 Gross per 24 hour  Intake 480 ml  Output -  Net 480 ml     Assessment/Plan:  This is a 68 y.o. female who is s/p left CEA 2 Days Post-Op  -pt is doing well this am. -pt neuro exam is in tact -pt has ambulated -pt has voided -f/u with Dr. Scot Dock in 2 weeks.   Leontine Locket, PA-C [facility] [phone]

## 2018-10-08 ENCOUNTER — Telehealth: Payer: Self-pay | Admitting: Vascular Surgery

## 2018-10-08 NOTE — Telephone Encounter (Signed)
Barbara Oliver, nurse with [facility] called.    Says pt gained 2 Lbs in a short amount of time.  She also had pain radiating to the back of her head 2 days ago.   Pt has upcoming appt.   I advised her to have pt go the ED if she is still having symptoms.  [name] verbalized understanding.  Thurston Hole., LPN

## 2018-10-13 ENCOUNTER — Telehealth (HOSPITAL_COMMUNITY): Payer: Self-pay | Admitting: Rehabilitation

## 2018-10-13 NOTE — Telephone Encounter (Signed)

## 2018-10-14 ENCOUNTER — Other Ambulatory Visit: Payer: Self-pay

## 2018-10-14 ENCOUNTER — Ambulatory Visit (INDEPENDENT_AMBULATORY_CARE_PROVIDER_SITE_OTHER): Payer: Self-pay | Admitting: Vascular Surgery

## 2018-10-14 ENCOUNTER — Encounter: Payer: Self-pay | Admitting: Vascular Surgery

## 2018-10-14 VITALS — BP 123/78 | HR 82 | Temp 97.9°F | Resp 20 | Ht 73.0 in | Wt 194.8 lb

## 2018-10-14 DIAGNOSIS — Z48812 Encounter for surgical aftercare following surgery on the circulatory system: Secondary | ICD-10-CM

## 2018-10-14 DIAGNOSIS — I6522 Occlusion and stenosis of left carotid artery: Secondary | ICD-10-CM

## 2018-10-14 NOTE — Progress Notes (Signed)
Patient name: Barbara Oliver MRN: [id number] DOB: [birth year] Sex: female  REASON FOR VISIT:   Follow-up after left carotid endarterectomy  HPI:   Barbara Oliver is a pleasant 68 y.o. female who is being followed with carotid disease.  The stenosis on the left progressed to greater than 80% and left carotid endarterectomy was recommended in order to lower her risk of future stroke.  She underwent left carotid endarterectomy with bovine pericardial patch angioplasty on 09/29/2018.  She comes in for her first outpatient visit.  The patient denies any focal weakness or paresthesias.  She has no specific complaints except some itching at her incision site.  Current Outpatient Medications  Medication Sig Dispense Refill  . acetaminophen (TYLENOL) 500 MG tablet Take 1,000 mg by mouth daily as needed for mild pain.    Marland Kitchen albuterol (PROVENTIL HFA;VENTOLIN HFA) 108 (90 Base) MCG/ACT inhaler Inhale 1-2 puffs into the lungs every 6 (six) hours as needed for wheezing or shortness of breath. 1 Inhaler 0  . albuterol (PROVENTIL) (2.5 MG/3ML) 0.083% nebulizer solution Take 2.5 mg by nebulization every 6 (six) hours as needed for wheezing or shortness of breath.     Marland Kitchen aspirin EC 81 MG EC tablet Take 1 tablet (81 mg total) by mouth daily.    Marland Kitchen atorvastatin (LIPITOR) 20 MG tablet Take 20 mg by mouth daily.    . budesonide-formoterol (SYMBICORT) 160-4.5 MCG/ACT inhaler Inhale 2 puffs into the lungs 2 (two) times daily as needed (SOB).    . carvedilol (COREG) 12.5 MG tablet Take 12.5 mg by mouth 2 (two) times daily with a meal.    . clopidogrel (PLAVIX) 75 MG tablet Take 1 tablet by mouth daily.    Marland Kitchen ENTRESTO 24-26 MG Take 1 tablet by mouth daily.    . fluticasone (FLONASE) 50 MCG/ACT nasal spray Place 1 spray into both nostrils daily. 16 g 0  . furosemide (LASIX) 40 MG tablet TAKE 1 TABLET BY MOUTH DAILY. (Patient taking differently: Take 40 mg by mouth daily. ) 30 tablet 6  . Investigational - Study  Medication Study name: GALACTIC Study - Placebo / omecamtiv mecarbil tablet Take 1 tablet by mouth twice daily 1 each PRN  . pantoprazole (PROTONIX) 40 MG tablet Take 1 tablet (40 mg total) by mouth daily. 30 tablet 0  . spironolactone (ALDACTONE) 25 MG tablet TAKE 1 TABLET BY MOUTH DAILY. (Patient taking differently: Take 25 mg by mouth daily. ) 30 tablet 6  . umeclidinium-vilanterol (ANORO ELLIPTA) 62.5-25 MCG/INH AEPB Inhale 1 puff into the lungs daily as needed (SOB).    . Vitamin D, Ergocalciferol, (DRISDOL) 1.25 MG (50000 UT) CAPS capsule Take 50,000 Units by mouth every 7 (seven) days.    Marland Kitchen oxyCODONE-acetaminophen (PERCOCET) 5-325 MG tablet Take 1 tablet by mouth every 6 (six) hours as needed for severe pain. (Patient not taking: Reported on 10/14/2018) 8 tablet 0   No current facility-administered medications for this visit.     REVIEW OF SYSTEMS:  [X]  denotes positive finding, [ ]  denotes negative finding Vascular    Leg swelling    Cardiac    Chest pain or chest pressure:    Shortness of breath upon exertion:    Short of breath when lying flat:    Irregular heart rhythm:    Constitutional    Fever or chills:     PHYSICAL EXAM:   Vitals:   10/14/18 0813 10/14/18 0815  BP: 115/70 123/78  Pulse: 82   Resp: 20  Temp: 97.9 F (36.6 C)   SpO2: 96%   Weight: 194 lb 12.8 oz (88.4 kg)   Height: 6\' 1"  (1.854 m)     GENERAL: The patient is a well-nourished female, in no acute distress. The vital signs are documented above. CARDIOVASCULAR: There is a regular rate and rhythm. PULMONARY: There is good air exchange bilaterally without wheezing or rales. Her incision is healing nicely. She has no focal weakness or paresthesias.  DATA:   No new data  MEDICAL ISSUES:   STATUS POST LEFT CAROTID ENDARTERECTOMY: Patient is doing well status post left carotid endarterectomy.  Preoperative duplex scan showed no significant carotid stenosis on the right.  I have ordered a follow-up  carotid duplex scan in 9 months and I will see her back at that time.  If things look good then we can just check her on a yearly basis.  She knows to continue her aspirin and statin.  She will call sooner if she has problems.  VASCULAR QUALITY INITIATIVE: She is on aspirin and is on a statin.  [name] [facility] [phone]

## 2018-11-17 ENCOUNTER — Ambulatory Visit: Payer: Medicare HMO | Admitting: Cardiovascular Disease

## 2018-12-21 ENCOUNTER — Other Ambulatory Visit: Payer: Self-pay | Admitting: *Deleted

## 2018-12-21 NOTE — Progress Notes (Signed)
Virtual Visit via Video Note changed to phone visit at patient request   This visit type was conducted due to national recommendations for restrictions regarding the COVID-19 Pandemic (e.g. social distancing) in an effort to limit this patient's exposure and mitigate transmission in our community.  Due to her co-morbid illnesses, this patient is at least at moderate risk for complications without adequate follow up.  This format is felt to be most appropriate for this patient at this time.  All issues noted in this document were discussed and addressed.  A limited physical exam was performed with this format.  Please refer to the patient's chart for her consent to telehealth for [facility].   Date:  12/22/2018   ID:  Barbara Oliver, DOB 1950/11/11, MRN [id number]  Patient Location:Home Provider Location: Home  PCP:  Courtney Paris, NP  Cardiologist:  Dr Jens Som  Evaluation Performed:  Follow-Up Visit  Chief Complaint:  FU NICM and CHF  History of Present Illness:    Follow-up nonischemic cardiomyopathy and congestive heart failure.  Previous echocardiogram September 2018 showed ejection fraction 20 to 25%, mild aortic insufficiency, moderate mitral regurgitation, mild biatrial enlargement and mild right ventricular enlargement.  Admitted January 2019 with acute on chronic CHF.  Cardiac catheterization revealed normal coronary arteries and ejection fraction 30 to 35%, pulmonary capillary wedge pressure 10.  Treated medically. Echocardiogram February 2020 showed normal LV function, moderate left ventricular hypertrophy. Patient also with history of lung nodule and follow-up recommended September 2019. Carotid dopplers 7/20 showed 80-99 left. Had left CEA 8/20. Since last seen, pt has occasional dyspnea; no CP or syncope.  The patient does not have symptoms concerning for COVID-19 infection (fever, chills, cough, or new shortness of breath).    Past Medical History:  Diagnosis Date   . Abdominal pain, epigastric 10/06/2015  . Acid reflux   . Acute on chronic systolic (congestive) heart failure (HCC) 03/19/2017  . Acute renal insufficiency 10/06/2015  . Acute respiratory failure with hypoxia (HCC) 04/26/2018  . Chronic systolic CHF (congestive heart failure) (HCC)   . CKD (chronic kidney disease), stage III (HCC)   . 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 COPD with acute bronchitis (HCC) 04/25/2018  . Dyspnea   . Elevated transaminase level 10/06/2015  . History of noncompliance with medical treatment, presenting hazards to health   . Hyperglycemia 10/06/2015  . LBBB (left bundle branch block)   . Lung nodule    a. seen on prior CT, f/u due 11/2017.  . Mitral regurgitation   . NICM (nonischemic cardiomyopathy) (HCC)    a. prior hx of this, EF 20-25% in 11/2016. b. EF 30-35% by cath 03/2017 with normal coronaries.  . Protein-calorie malnutrition, severe 03/07/2016  . Respiratory distress 04/25/2018  . Severe mitral regurgitation 10/06/2015  . Tobacco abuse    Past Surgical History:  Procedure Laterality Date  . CARDIAC CATHETERIZATION    . ENDARTERECTOMY Left 09/29/2018   Procedure: ENDARTERECTOMY CAROTID ARTERY LEFT;  Surgeon: Chuck Hint, MD;  Location: [facility] OR;  Service: Vascular;  Laterality: Left;  . HERNIA REPAIR    . PATCH ANGIOPLASTY Left 09/29/2018   Procedure: Patch Angioplasty Left Carotid Artery using Xenosure Biologic Patch;  Surgeon: Chuck Hint, MD;  Location: [facility] OR;  Service: Vascular;  Laterality: Left;  . RIGHT/LEFT HEART CATH AND CORONARY ANGIOGRAPHY N/A 03/21/2017   Procedure: RIGHT/LEFT HEART CATH AND CORONARY ANGIOGRAPHY;  Surgeon: Dolores Patty, MD;  Location: [facility] INVASIVE CV LAB;  Service:  Cardiovascular;  Laterality: N/A;     Current Meds  Medication Sig  . acetaminophen (TYLENOL) 500 MG tablet Take 1,000 mg by mouth daily as needed for mild pain.  Marland Kitchen albuterol (PROVENTIL HFA;VENTOLIN HFA) 108 (90 Base) MCG/ACT  inhaler Inhale 1-2 puffs into the lungs every 6 (six) hours as needed for wheezing or shortness of breath.  Marland Kitchen albuterol (PROVENTIL) (2.5 MG/3ML) 0.083% nebulizer solution Take 2.5 mg by nebulization every 6 (six) hours as needed for wheezing or shortness of breath.   Marland Kitchen aspirin EC 81 MG EC tablet Take 1 tablet (81 mg total) by mouth daily.  Marland Kitchen atorvastatin (LIPITOR) 20 MG tablet Take 20 mg by mouth daily.  . budesonide-formoterol (SYMBICORT) 160-4.5 MCG/ACT inhaler Inhale 2 puffs into the lungs 2 (two) times daily as needed (SOB).  . carvedilol (COREG) 12.5 MG tablet Take 12.5 mg by mouth 2 (two) times daily with a meal.  . clopidogrel (PLAVIX) 75 MG tablet Take 1 tablet by mouth daily.  Marland Kitchen ENTRESTO 24-26 MG Take 1 tablet by mouth daily.  . fluticasone (FLONASE) 50 MCG/ACT nasal spray Place 1 spray into both nostrils daily.  . furosemide (LASIX) 40 MG tablet TAKE 1 TABLET BY MOUTH DAILY. (Patient taking differently: Take 40 mg by mouth daily. )  . pantoprazole (PROTONIX) 40 MG tablet Take 1 tablet (40 mg total) by mouth daily.  Marland Kitchen spironolactone (ALDACTONE) 25 MG tablet TAKE 1 TABLET BY MOUTH DAILY. (Patient taking differently: Take 25 mg by mouth daily. )  . umeclidinium-vilanterol (ANORO ELLIPTA) 62.5-25 MCG/INH AEPB Inhale 1 puff into the lungs daily as needed (SOB).  . Vitamin D, Ergocalciferol, (DRISDOL) 1.25 MG (50000 UT) CAPS capsule Take 50,000 Units by mouth every 7 (seven) days.  . [DISCONTINUED] oxyCODONE-acetaminophen (PERCOCET) 5-325 MG tablet Take 1 tablet by mouth every 6 (six) hours as needed for severe pain.     Allergies:   Patient has no known allergies.   Social History   Tobacco Use  . Smoking status: Light Tobacco Smoker    Packs/day: 0.50    Years: 50.00    Pack years: 25.00    Types: Cigarettes  . Smokeless tobacco: Never Used  . Tobacco comment: Pt reports a pack will last her about a month  Substance Use Topics  . Alcohol use: No  . Drug use: No     Family Hx:  The patient's family history includes Heart attack in her father; Hypertension in her mother.  ROS:   Please see the history of present illness.    No Fever, chills  or productive cough; pt complains of back pain All other systems reviewed and are negative.   Recent Labs: 04/25/2018: B Natriuretic Peptide 526.4 09/24/2018: ALT 14 09/30/2018: BUN 20; Creatinine, Ser 1.29; Hemoglobin 9.9; Platelets 283; Potassium 4.0; Sodium 135   Recent Lipid Panel Lab Results  Component Value Date/Time   CHOL 188 04/26/2018 02:45 AM   TRIG 94 04/26/2018 02:45 AM   HDL 41 04/26/2018 02:45 AM   CHOLHDL 4.6 04/26/2018 02:45 AM   LDLCALC 128 (H) 04/26/2018 02:45 AM    Wt Readings from Last 3 Encounters:  12/22/18 195 lb (88.5 kg)  10/14/18 194 lb 12.8 oz (88.4 kg)  09/29/18 193 lb 12.6 oz (87.9 kg)     Objective:    Vital Signs:  Ht 6\' 1"  (1.854 m)   Wt 195 lb (88.5 kg)   BMI 25.73 kg/m    VITAL SIGNS:  reviewed NAD Answers questions appropriately Normal affect  Remainder of physical examination not performed (telehealth visit; coronavirus pandemic)  ASSESSMENT & PLAN:    1. NICM-continue entresto and beta blocker; LV function improved on most recent echo. 2. CEA-continue ASA and statin; FU vascular surgery. 3. Chronic diastolic CHF-euvolemic based on history; continue present dose of diuretic, fluid restriction and low Na diet.  Check Bmet. 4. Tobacco abuse-pt counseled on discontinuing. 5. Lung nodule-pt did not have CT as ordered at last ov; will reschedule. 6. Hyperlipidemia-given documented vascular disease I will increase Lipitor to 80 mg daily.  Check lipids and liver in 12 weeks.  COVID-19 Education: The importance of social distancing was discussed today.  Time:   Today, I have spent 18 minutes with the patient with telehealth technology discussing the above problems.     Medication Adjustments/Labs and Tests Ordered: Current medicines are reviewed at length with the  patient today.  Concerns regarding medicines are outlined above.   Tests Ordered: No orders of the defined types were placed in this encounter.   Medication Changes: No orders of the defined types were placed in this encounter.   Follow Up:  Either In Person or Virtual Visit in 1 year(s)  Signed, Olga Millers, MD  12/22/2018 8:31 AM    [facility]

## 2018-12-22 ENCOUNTER — Ambulatory Visit: Payer: Medicare HMO | Admitting: Cardiology

## 2018-12-22 ENCOUNTER — Telehealth (INDEPENDENT_AMBULATORY_CARE_PROVIDER_SITE_OTHER): Payer: Medicare HMO | Admitting: Cardiology

## 2018-12-22 VITALS — Ht 73.0 in | Wt 195.0 lb

## 2018-12-22 DIAGNOSIS — R911 Solitary pulmonary nodule: Secondary | ICD-10-CM | POA: Diagnosis not present

## 2018-12-22 DIAGNOSIS — I340 Nonrheumatic mitral (valve) insufficiency: Secondary | ICD-10-CM | POA: Diagnosis not present

## 2018-12-22 DIAGNOSIS — I5032 Chronic diastolic (congestive) heart failure: Secondary | ICD-10-CM

## 2018-12-22 MED ORDER — ATORVASTATIN CALCIUM 80 MG PO TABS: 80.0000 mg | ORAL_TABLET | Freq: Every day | ORAL | 3 refills | Status: AC

## 2018-12-22 NOTE — Patient Instructions (Addendum)
Medication Instructions:  Increase Atorvastatin to 80 mg daily after dinner  New prescription sent to your pharmacy Continue all other medications  If you need a refill on your cardiac medications before your next appointment, please call your pharmacy.   Lab work: Repeat fasting bmet,lipid and hepatic panels in 3 months   Lab orders enclosed    Testing/Procedures: Schedule non-contrast chest ct   Scheduler will call you back with appointment   Follow-Up: At [facility], you and your health needs are our priority.  As part of our continuing mission to provide you with exceptional heart care, we have created designated Provider Care Teams.  These Care Teams include your primary Cardiologist (physician) and Advanced Practice Providers (APPs -  Physician Assistants and Nurse Practitioners) who all work together to provide you with the care you need, when you need it. . Schedule follow up appointment in 1 year   Call in July to schedule Oct appointment

## 2018-12-22 NOTE — Addendum Note (Signed)
Addended by: Kathyrn Lass on: 12/22/2018 09:13 AM   Modules accepted: Orders

## 2018-12-23 ENCOUNTER — Other Ambulatory Visit: Payer: Self-pay

## 2018-12-23 DIAGNOSIS — I5032 Chronic diastolic (congestive) heart failure: Secondary | ICD-10-CM

## 2018-12-23 DIAGNOSIS — I340 Nonrheumatic mitral (valve) insufficiency: Secondary | ICD-10-CM

## 2019-01-08 ENCOUNTER — Other Ambulatory Visit: Payer: Self-pay | Admitting: *Deleted

## 2019-01-08 DIAGNOSIS — R911 Solitary pulmonary nodule: Secondary | ICD-10-CM

## 2019-01-18 ENCOUNTER — Inpatient Hospital Stay: Admission: RE | Admit: 2019-01-18 | Payer: Medicare HMO | Source: Ambulatory Visit

## 2019-01-18 ENCOUNTER — Other Ambulatory Visit: Payer: Medicare HMO

## 2019-01-20 ENCOUNTER — Encounter: Payer: Self-pay | Admitting: Cardiology

## 2019-01-22 ENCOUNTER — Encounter: Payer: Self-pay | Admitting: Emergency Medicine

## 2019-01-22 ENCOUNTER — Inpatient Hospital Stay
Admission: EM | Admit: 2019-01-22 | Discharge: 2019-01-28 | DRG: 811 | Disposition: A | Discharge status: Home / Self Care | Payer: Medicare HMO | Attending: Internal Medicine | Admitting: Internal Medicine

## 2019-01-22 ENCOUNTER — Emergency Department: Payer: Medicare HMO

## 2019-01-22 ENCOUNTER — Other Ambulatory Visit: Payer: Self-pay

## 2019-01-22 DIAGNOSIS — K31811 Angiodysplasia of stomach and duodenum with bleeding: Secondary | ICD-10-CM | POA: Diagnosis present

## 2019-01-22 DIAGNOSIS — Z8249 Family history of ischemic heart disease and other diseases of the circulatory system: Secondary | ICD-10-CM

## 2019-01-22 DIAGNOSIS — S20211A Contusion of right front wall of thorax, initial encounter: Secondary | ICD-10-CM | POA: Diagnosis not present

## 2019-01-22 DIAGNOSIS — I080 Rheumatic disorders of both mitral and aortic valves: Secondary | ICD-10-CM | POA: Diagnosis present

## 2019-01-22 DIAGNOSIS — Z951 Presence of aortocoronary bypass graft: Secondary | ICD-10-CM

## 2019-01-22 DIAGNOSIS — I420 Dilated cardiomyopathy: Secondary | ICD-10-CM | POA: Diagnosis present

## 2019-01-22 DIAGNOSIS — Z20828 Contact with and (suspected) exposure to other viral communicable diseases: Secondary | ICD-10-CM | POA: Diagnosis present

## 2019-01-22 DIAGNOSIS — I2510 Atherosclerotic heart disease of native coronary artery without angina pectoris: Secondary | ICD-10-CM | POA: Diagnosis present

## 2019-01-22 DIAGNOSIS — D509 Iron deficiency anemia, unspecified: Secondary | ICD-10-CM | POA: Diagnosis present

## 2019-01-22 DIAGNOSIS — Z95820 Peripheral vascular angioplasty status with implants and grafts: Secondary | ICD-10-CM

## 2019-01-22 DIAGNOSIS — E538 Deficiency of other specified B group vitamins: Secondary | ICD-10-CM | POA: Diagnosis present

## 2019-01-22 DIAGNOSIS — N179 Acute kidney failure, unspecified: Secondary | ICD-10-CM | POA: Diagnosis not present

## 2019-01-22 DIAGNOSIS — N183 Chronic kidney disease, stage 3 unspecified: Secondary | ICD-10-CM | POA: Diagnosis present

## 2019-01-22 DIAGNOSIS — I130 Hypertensive heart and chronic kidney disease with heart failure and stage 1 through stage 4 chronic kidney disease, or unspecified chronic kidney disease: Secondary | ICD-10-CM | POA: Diagnosis present

## 2019-01-22 DIAGNOSIS — Z7902 Long term (current) use of antithrombotics/antiplatelets: Secondary | ICD-10-CM

## 2019-01-22 DIAGNOSIS — F17210 Nicotine dependence, cigarettes, uncomplicated: Secondary | ICD-10-CM | POA: Diagnosis present

## 2019-01-22 DIAGNOSIS — I5032 Chronic diastolic (congestive) heart failure: Secondary | ICD-10-CM | POA: Diagnosis present

## 2019-01-22 DIAGNOSIS — N1831 Chronic kidney disease, stage 3a: Secondary | ICD-10-CM | POA: Diagnosis present

## 2019-01-22 DIAGNOSIS — Z79899 Other long term (current) drug therapy: Secondary | ICD-10-CM

## 2019-01-22 DIAGNOSIS — E785 Hyperlipidemia, unspecified: Secondary | ICD-10-CM | POA: Diagnosis present

## 2019-01-22 DIAGNOSIS — K219 Gastro-esophageal reflux disease without esophagitis: Secondary | ICD-10-CM | POA: Diagnosis present

## 2019-01-22 DIAGNOSIS — N189 Chronic kidney disease, unspecified: Secondary | ICD-10-CM

## 2019-01-22 DIAGNOSIS — K642 Third degree hemorrhoids: Secondary | ICD-10-CM | POA: Diagnosis present

## 2019-01-22 DIAGNOSIS — Z7951 Long term (current) use of inhaled steroids: Secondary | ICD-10-CM

## 2019-01-22 DIAGNOSIS — I739 Peripheral vascular disease, unspecified: Secondary | ICD-10-CM | POA: Diagnosis present

## 2019-01-22 DIAGNOSIS — R0789 Other chest pain: Secondary | ICD-10-CM | POA: Diagnosis not present

## 2019-01-22 DIAGNOSIS — D649 Anemia, unspecified: Secondary | ICD-10-CM | POA: Diagnosis not present

## 2019-01-22 DIAGNOSIS — I428 Other cardiomyopathies: Secondary | ICD-10-CM | POA: Diagnosis present

## 2019-01-22 DIAGNOSIS — D62 Acute posthemorrhagic anemia: Principal | ICD-10-CM | POA: Diagnosis present

## 2019-01-22 DIAGNOSIS — Z7982 Long term (current) use of aspirin: Secondary | ICD-10-CM

## 2019-01-22 DIAGNOSIS — I700 Atherosclerosis of aorta: Secondary | ICD-10-CM | POA: Diagnosis present

## 2019-01-22 DIAGNOSIS — J449 Chronic obstructive pulmonary disease, unspecified: Secondary | ICD-10-CM | POA: Diagnosis present

## 2019-01-22 DIAGNOSIS — I5042 Chronic combined systolic (congestive) and diastolic (congestive) heart failure: Secondary | ICD-10-CM | POA: Diagnosis present

## 2019-01-22 LAB — BASIC METABOLIC PANEL
Anion gap: 10 (ref 5–15)
BUN: 22 mg/dL (ref 8–23)
CO2: 21 mmol/L — ABNORMAL LOW (ref 22–32)
Calcium: 9.3 mg/dL (ref 8.9–10.3)
Chloride: 108 mmol/L (ref 98–111)
Creatinine, Ser: 1.36 mg/dL — ABNORMAL HIGH (ref 0.44–1.00)
GFR calc Af Amer: 47 mL/min — ABNORMAL LOW (ref >60)
GFR calc non Af Amer: 40 mL/min — ABNORMAL LOW (ref >60)
Glucose, Bld: 101 mg/dL — ABNORMAL HIGH (ref 70–99)
Potassium: 3.6 mmol/L (ref 3.5–5.1)
Sodium: 139 mmol/L (ref 135–145)

## 2019-01-22 LAB — RETICULOCYTES
Immature Retic Fract: 31.7 % — ABNORMAL HIGH (ref 2.3–15.9)
RBC.: 3.14 MIL/uL — ABNORMAL LOW (ref 3.87–5.11)
Retic Count, Absolute: 62.2 10*3/uL (ref 19.0–186.0)
Retic Ct Pct: 2 % (ref 0.4–3.1)

## 2019-01-22 LAB — CBC
HCT: 24 % — ABNORMAL LOW (ref 36.0–46.0)
Hemoglobin: 6.7 g/dL — ABNORMAL LOW (ref 12.0–15.0)
MCH: 19.3 pg — ABNORMAL LOW (ref 26.0–34.0)
MCHC: 27.9 g/dL — ABNORMAL LOW (ref 30.0–36.0)
MCV: 69 fL — ABNORMAL LOW (ref 80.0–100.0)
Platelets: 443 10*3/uL — ABNORMAL HIGH (ref 150–400)
RBC: 3.48 MIL/uL — ABNORMAL LOW (ref 3.87–5.11)
RDW: 20.7 % — ABNORMAL HIGH (ref 11.5–15.5)
WBC: 9.3 10*3/uL (ref 4.0–10.5)
nRBC: 0 % (ref 0.0–0.2)

## 2019-01-22 MED ORDER — SODIUM CHLORIDE 0.9 % IV SOLN
INTRAVENOUS | Status: DC
  Administered 2019-01-23 (×2): via INTRAVENOUS

## 2019-01-22 MED ORDER — ONDANSETRON HCL 4 MG/2ML IJ SOLN: 4.0000 mg | Freq: Four times a day (QID) | INTRAMUSCULAR | Status: DC | PRN

## 2019-01-22 MED ORDER — ACETAMINOPHEN 325 MG PO TABS
650.0000 mg | ORAL_TABLET | Freq: Four times a day (QID) | ORAL | Status: DC | PRN
  Filled 2019-01-22: qty 2

## 2019-01-22 MED ORDER — ATORVASTATIN CALCIUM 20 MG PO TABS
80.0000 mg | ORAL_TABLET | Freq: Every day | ORAL | Status: DC
  Administered 2019-01-23 – 2019-01-27 (×5): 80 mg via ORAL
  Filled 2019-01-22 (×3): qty 4
  Filled 2019-01-22: qty 1
  Filled 2019-01-22 (×2): qty 4

## 2019-01-22 MED ORDER — FLUTICASONE PROPIONATE 50 MCG/ACT NA SUSP
1.0000 | Freq: Every day | NASAL | Status: DC
  Administered 2019-01-24 – 2019-01-28 (×5): 1 via NASAL
  Filled 2019-01-22 (×2): qty 16

## 2019-01-22 MED ORDER — CARVEDILOL 12.5 MG PO TABS
12.5000 mg | ORAL_TABLET | Freq: Two times a day (BID) | ORAL | Status: DC
  Administered 2019-01-23 – 2019-01-28 (×11): 12.5 mg via ORAL
  Filled 2019-01-22 (×5): qty 1
  Filled 2019-01-22: qty 2
  Filled 2019-01-22 (×7): qty 1

## 2019-01-22 MED ORDER — MORPHINE SULFATE (PF) 4 MG/ML IV SOLN
4.0000 mg | Freq: Once | INTRAVENOUS | Status: AC
  Administered 2019-01-22: 4 mg via INTRAVENOUS
  Filled 2019-01-22: qty 1

## 2019-01-22 MED ORDER — CLOPIDOGREL BISULFATE 75 MG PO TABS
75.0000 mg | ORAL_TABLET | Freq: Every day | ORAL | Status: DC
  Administered 2019-01-23: 75 mg via ORAL
  Filled 2019-01-22: qty 1

## 2019-01-22 MED ORDER — ALBUTEROL SULFATE (2.5 MG/3ML) 0.083% IN NEBU: 2.5000 mg | INHALATION_SOLUTION | Freq: Four times a day (QID) | RESPIRATORY_TRACT | Status: DC | PRN

## 2019-01-22 MED ORDER — VITAMIN D (ERGOCALCIFEROL) 1.25 MG (50000 UNIT) PO CAPS
50000.0000 [IU] | ORAL_CAPSULE | ORAL | Status: DC
  Administered 2019-01-24: 50000 [IU] via ORAL
  Filled 2019-01-22: qty 1

## 2019-01-22 MED ORDER — UMECLIDINIUM-VILANTEROL 62.5-25 MCG/INH IN AEPB
1.0000 | INHALATION_SPRAY | Freq: Every day | RESPIRATORY_TRACT | Status: DC | PRN
  Filled 2019-01-22: qty 14

## 2019-01-22 MED ORDER — ACETAMINOPHEN 650 MG RE SUPP: 650.0000 mg | Freq: Four times a day (QID) | RECTAL | Status: DC | PRN

## 2019-01-22 MED ORDER — OXYCODONE-ACETAMINOPHEN 5-325 MG PO TABS: 1.0000 | ORAL_TABLET | ORAL | Status: DC | PRN

## 2019-01-22 MED ORDER — MOMETASONE FURO-FORMOTEROL FUM 200-5 MCG/ACT IN AERO
2.0000 | INHALATION_SPRAY | Freq: Two times a day (BID) | RESPIRATORY_TRACT | Status: DC
  Administered 2019-01-23 – 2019-01-28 (×10): 2 via RESPIRATORY_TRACT
  Filled 2019-01-22 (×2): qty 8.8

## 2019-01-22 MED ORDER — ALBUTEROL SULFATE HFA 108 (90 BASE) MCG/ACT IN AERS: 1.0000 | INHALATION_SPRAY | Freq: Four times a day (QID) | RESPIRATORY_TRACT | Status: DC | PRN

## 2019-01-22 MED ORDER — IPRATROPIUM-ALBUTEROL 0.5-2.5 (3) MG/3ML IN SOLN
3.0000 mL | Freq: Once | RESPIRATORY_TRACT | Status: AC
  Administered 2019-01-22: 3 mL via RESPIRATORY_TRACT
  Filled 2019-01-22: qty 3

## 2019-01-22 MED ORDER — IOHEXOL 300 MG/ML  SOLN
100.0000 mL | Freq: Once | INTRAMUSCULAR | Status: AC | PRN
  Administered 2019-01-22: 100 mL via INTRAVENOUS

## 2019-01-22 MED ORDER — ONDANSETRON HCL 4 MG PO TABS
4.0000 mg | ORAL_TABLET | Freq: Four times a day (QID) | ORAL | Status: DC | PRN
  Filled 2019-01-22: qty 1

## 2019-01-22 MED ORDER — ONDANSETRON HCL 4 MG/2ML IJ SOLN
2.0000 mg | Freq: Once | INTRAMUSCULAR | Status: AC
  Administered 2019-01-22: 2 mg via INTRAVENOUS
  Filled 2019-01-22: qty 2

## 2019-01-22 MED ORDER — OXYCODONE-ACETAMINOPHEN 5-325 MG PO TABS
1.0000 | ORAL_TABLET | ORAL | Status: AC
  Administered 2019-01-22: 16:00:00 1 via ORAL
  Filled 2019-01-22: qty 1

## 2019-01-22 MED ORDER — TRAZODONE HCL 50 MG PO TABS
25.0000 mg | ORAL_TABLET | Freq: Every evening | ORAL | Status: DC | PRN
  Filled 2019-01-22: qty 1
  Filled 2019-01-22: qty 0.5

## 2019-01-22 MED ORDER — MORPHINE SULFATE (PF) 2 MG/ML IV SOLN
2.0000 mg | INTRAVENOUS | Status: DC | PRN
  Administered 2019-01-23 – 2019-01-26 (×4): 2 mg via INTRAVENOUS
  Filled 2019-01-22 (×4): qty 1
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
## 2019-01-22 NOTE — ED Provider Notes (Signed)
Barbara Oliver was evaluated in Emergency Department on 01/22/2019 for the symptoms described in the history of present illness. She was evaluated in the context of the global COVID-19 pandemic, which necessitated consideration that the patient might be at risk for infection with the SARS-CoV-2 virus that causes COVID-19. Institutional protocols and algorithms that pertain to the evaluation of patients at risk for COVID-19 are in a state of rapid change based on information released by regulatory bodies including the CDC and federal and state organizations. These policies and algorithms were followed during the patient's care in the ED.  Patient is noted to have a cough, dry in the ER.  Patient reports this is chronic.  Not new.  Denies Covid-like symptomatology   Delman Kitten, MD 01/22/19 2150
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
## 2019-01-22 NOTE — ED Notes (Signed)
Per admitting doctor pt ok to eat. Pt given [place] sandwich tray and gingerale. Pt reports pain 4/10 now after pain medication

## 2019-01-22 NOTE — ED Notes (Signed)
Recollect of typing screen done and sent to lab. Patient signed consent for blood transfusion.

## 2019-01-23 DIAGNOSIS — Z8249 Family history of ischemic heart disease and other diseases of the circulatory system: Secondary | ICD-10-CM | POA: Diagnosis not present

## 2019-01-23 DIAGNOSIS — E538 Deficiency of other specified B group vitamins: Secondary | ICD-10-CM | POA: Diagnosis present

## 2019-01-23 DIAGNOSIS — I10 Essential (primary) hypertension: Secondary | ICD-10-CM | POA: Diagnosis not present

## 2019-01-23 DIAGNOSIS — Z95820 Peripheral vascular angioplasty status with implants and grafts: Secondary | ICD-10-CM | POA: Diagnosis not present

## 2019-01-23 DIAGNOSIS — D649 Anemia, unspecified: Secondary | ICD-10-CM | POA: Diagnosis not present

## 2019-01-23 DIAGNOSIS — J449 Chronic obstructive pulmonary disease, unspecified: Secondary | ICD-10-CM | POA: Diagnosis present

## 2019-01-23 DIAGNOSIS — Z951 Presence of aortocoronary bypass graft: Secondary | ICD-10-CM | POA: Diagnosis not present

## 2019-01-23 DIAGNOSIS — D509 Iron deficiency anemia, unspecified: Secondary | ICD-10-CM | POA: Diagnosis present

## 2019-01-23 DIAGNOSIS — K921 Melena: Secondary | ICD-10-CM | POA: Diagnosis not present

## 2019-01-23 DIAGNOSIS — I130 Hypertensive heart and chronic kidney disease with heart failure and stage 1 through stage 4 chronic kidney disease, or unspecified chronic kidney disease: Secondary | ICD-10-CM | POA: Diagnosis present

## 2019-01-23 DIAGNOSIS — I5032 Chronic diastolic (congestive) heart failure: Secondary | ICD-10-CM | POA: Diagnosis not present

## 2019-01-23 DIAGNOSIS — I700 Atherosclerosis of aorta: Secondary | ICD-10-CM | POA: Diagnosis present

## 2019-01-23 DIAGNOSIS — K642 Third degree hemorrhoids: Secondary | ICD-10-CM | POA: Diagnosis present

## 2019-01-23 DIAGNOSIS — I2510 Atherosclerotic heart disease of native coronary artery without angina pectoris: Secondary | ICD-10-CM | POA: Diagnosis present

## 2019-01-23 DIAGNOSIS — K31811 Angiodysplasia of stomach and duodenum with bleeding: Secondary | ICD-10-CM | POA: Diagnosis present

## 2019-01-23 DIAGNOSIS — N1831 Chronic kidney disease, stage 3a: Secondary | ICD-10-CM | POA: Diagnosis present

## 2019-01-23 DIAGNOSIS — I428 Other cardiomyopathies: Secondary | ICD-10-CM | POA: Diagnosis present

## 2019-01-23 DIAGNOSIS — E785 Hyperlipidemia, unspecified: Secondary | ICD-10-CM | POA: Diagnosis present

## 2019-01-23 DIAGNOSIS — I420 Dilated cardiomyopathy: Secondary | ICD-10-CM | POA: Diagnosis present

## 2019-01-23 DIAGNOSIS — N179 Acute kidney failure, unspecified: Secondary | ICD-10-CM | POA: Diagnosis present

## 2019-01-23 DIAGNOSIS — K219 Gastro-esophageal reflux disease without esophagitis: Secondary | ICD-10-CM | POA: Diagnosis present

## 2019-01-23 DIAGNOSIS — R0789 Other chest pain: Secondary | ICD-10-CM | POA: Diagnosis present

## 2019-01-23 DIAGNOSIS — S20211A Contusion of right front wall of thorax, initial encounter: Secondary | ICD-10-CM | POA: Diagnosis not present

## 2019-01-23 DIAGNOSIS — N183 Chronic kidney disease, stage 3 unspecified: Secondary | ICD-10-CM | POA: Diagnosis not present

## 2019-01-23 DIAGNOSIS — I739 Peripheral vascular disease, unspecified: Secondary | ICD-10-CM | POA: Diagnosis present

## 2019-01-23 DIAGNOSIS — D62 Acute posthemorrhagic anemia: Secondary | ICD-10-CM | POA: Diagnosis present

## 2019-01-23 DIAGNOSIS — I080 Rheumatic disorders of both mitral and aortic valves: Secondary | ICD-10-CM | POA: Diagnosis present

## 2019-01-23 DIAGNOSIS — F17210 Nicotine dependence, cigarettes, uncomplicated: Secondary | ICD-10-CM | POA: Diagnosis present

## 2019-01-23 DIAGNOSIS — Z7902 Long term (current) use of antithrombotics/antiplatelets: Secondary | ICD-10-CM | POA: Diagnosis not present

## 2019-01-23 DIAGNOSIS — I5042 Chronic combined systolic (congestive) and diastolic (congestive) heart failure: Secondary | ICD-10-CM | POA: Diagnosis present

## 2019-01-23 DIAGNOSIS — Z20828 Contact with and (suspected) exposure to other viral communicable diseases: Secondary | ICD-10-CM | POA: Diagnosis present

## 2019-01-23 LAB — PREPARE RBC (CROSSMATCH)

## 2019-01-23 LAB — SARS CORONAVIRUS 2 (TAT 6-24 HRS): SARS Coronavirus 2: NEGATIVE

## 2019-01-23 LAB — HEMOGLOBIN AND HEMATOCRIT, BLOOD
HCT: 27.8 % — ABNORMAL LOW (ref 36.0–46.0)
Hemoglobin: 8.5 g/dL — ABNORMAL LOW (ref 12.0–15.0)

## 2019-01-23 LAB — FERRITIN: Ferritin: 14 ng/mL (ref 11–307)

## 2019-01-23 LAB — IRON AND TIBC
Iron: 17 ug/dL — ABNORMAL LOW (ref 28–170)
Saturation Ratios: 4 % — ABNORMAL LOW (ref 10.4–31.8)
TIBC: 462 ug/dL — ABNORMAL HIGH (ref 250–450)
UIBC: 445 ug/dL

## 2019-01-23 LAB — FOLATE: Folate: 19.5 ng/mL (ref >5.9)

## 2019-01-23 LAB — VITAMIN B12: Vitamin B-12: 280 pg/mL (ref 180–914)

## 2019-01-23 LAB — LACTATE DEHYDROGENASE: LDH: 195 U/L — ABNORMAL HIGH (ref 98–192)

## 2019-01-23 LAB — ABO/RH: ABO/RH(D): O POS

## 2019-01-23 MED ORDER — SODIUM CHLORIDE 0.9% IV SOLUTION
Freq: Once | INTRAVENOUS | Status: AC
  Administered 2019-01-23: 10 mL/h via INTRAVENOUS
  Filled 2019-01-23: qty 250

## 2019-01-23 MED ORDER — OXYCODONE HCL 5 MG PO TABS
5.0000 mg | ORAL_TABLET | ORAL | Status: DC | PRN
  Administered 2019-01-23 – 2019-01-24 (×4): 5 mg via ORAL
  Filled 2019-01-23 (×5): qty 1

## 2019-01-23 MED ORDER — CYANOCOBALAMIN 1000 MCG/ML IJ SOLN
1000.0000 ug | Freq: Every day | INTRAMUSCULAR | Status: DC
  Administered 2019-01-24 – 2019-01-28 (×5): 1000 ug via SUBCUTANEOUS
  Filled 2019-01-23 (×5): qty 1

## 2019-01-23 MED ORDER — DIPHENHYDRAMINE HCL 25 MG PO CAPS
25.0000 mg | ORAL_CAPSULE | Freq: Once | ORAL | Status: AC
  Administered 2019-01-23: 25 mg via ORAL
  Filled 2019-01-23: qty 1

## 2019-01-23 MED ORDER — FUROSEMIDE 10 MG/ML IJ SOLN
20.0000 mg | Freq: Once | INTRAMUSCULAR | Status: AC
  Administered 2019-01-23: 20 mg via INTRAVENOUS
  Filled 2019-01-23: qty 2

## 2019-01-23 MED ORDER — ACETAMINOPHEN 325 MG PO TABS
650.0000 mg | ORAL_TABLET | Freq: Once | ORAL | Status: AC
  Administered 2019-01-23: 650 mg via ORAL
  Filled 2019-01-23: qty 2

## 2019-01-23 MED ORDER — ACETAMINOPHEN 500 MG PO TABS
500.0000 mg | ORAL_TABLET | Freq: Three times a day (TID) | ORAL | Status: DC
  Administered 2019-01-23 – 2019-01-28 (×16): 500 mg via ORAL
  Filled 2019-01-23 (×15): qty 1

## 2019-01-23 MED ORDER — PANTOPRAZOLE SODIUM 40 MG IV SOLR
40.0000 mg | Freq: Two times a day (BID) | INTRAVENOUS | Status: DC
  Administered 2019-01-23 – 2019-01-27 (×9): 40 mg via INTRAVENOUS
  Filled 2019-01-23 (×12): qty 40

## 2019-01-23 MED ORDER — ASPIRIN 81 MG PO TBEC
81.0000 mg | DELAYED_RELEASE_TABLET | Freq: Every day | ORAL | Status: DC
  Administered 2019-01-24 – 2019-01-28 (×5): 81 mg via ORAL
  Filled 2019-01-23 (×11): qty 1

## 2019-01-23 NOTE — ED Notes (Signed)
Updated pt's niece Marguarite Arbour over the phone with permission from patient

## 2019-01-23 NOTE — ED Notes (Signed)
Lavender tube sent to lab 
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
## 2019-01-23 NOTE — Progress Notes (Addendum)
Patient received to Ed rm 31. Patient continues to complain of patient. Patient ordered to receive 3 units of PRBC's. Blood bank called to notify that patient only qualified for 2 units PRBC's. Paged. Dr. Sidney Ace to notify.  57- Dr. Sidney Ace aware. Will check h/h after 2nd unit to eval need for additional units. Also ordered lasix between units.
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
## 2019-01-24 DIAGNOSIS — I10 Essential (primary) hypertension: Secondary | ICD-10-CM

## 2019-01-24 DIAGNOSIS — N183 Chronic kidney disease, stage 3 unspecified: Secondary | ICD-10-CM

## 2019-01-24 DIAGNOSIS — D649 Anemia, unspecified: Secondary | ICD-10-CM

## 2019-01-24 DIAGNOSIS — D62 Acute posthemorrhagic anemia: Principal | ICD-10-CM

## 2019-01-24 DIAGNOSIS — I5042 Chronic combined systolic (congestive) and diastolic (congestive) heart failure: Secondary | ICD-10-CM

## 2019-01-24 LAB — CBC
HCT: 29 % — ABNORMAL LOW (ref 36.0–46.0)
Hemoglobin: 8.7 g/dL — ABNORMAL LOW (ref 12.0–15.0)
MCH: 21.9 pg — ABNORMAL LOW (ref 26.0–34.0)
MCHC: 30 g/dL (ref 30.0–36.0)
MCV: 73 fL — ABNORMAL LOW (ref 80.0–100.0)
Platelets: 436 10*3/uL — ABNORMAL HIGH (ref 150–400)
RBC: 3.97 MIL/uL (ref 3.87–5.11)
RDW: 21.8 % — ABNORMAL HIGH (ref 11.5–15.5)
WBC: 8.8 10*3/uL (ref 4.0–10.5)
nRBC: 0 % (ref 0.0–0.2)

## 2019-01-24 LAB — TYPE AND SCREEN
ABO/RH(D): O POS
Antibody Screen: NEGATIVE
Unit division: 0
Unit division: 0

## 2019-01-24 LAB — HAPTOGLOBIN: Haptoglobin: 108 mg/dL (ref 37–355)

## 2019-01-24 LAB — BPAM RBC
Blood Product Expiration Date: 202012052359
Blood Product Expiration Date: 202012052359
ISSUE DATE / TIME: 202011140326
ISSUE DATE / TIME: 202011140813
Unit Type and Rh: 5100
Unit Type and Rh: 5100

## 2019-01-24 LAB — BASIC METABOLIC PANEL
Anion gap: 7 (ref 5–15)
BUN: 20 mg/dL (ref 8–23)
CO2: 24 mmol/L (ref 22–32)
Calcium: 9.2 mg/dL (ref 8.9–10.3)
Chloride: 108 mmol/L (ref 98–111)
Creatinine, Ser: 1.12 mg/dL — ABNORMAL HIGH (ref 0.44–1.00)
GFR calc Af Amer: 59 mL/min — ABNORMAL LOW (ref >60)
GFR calc non Af Amer: 51 mL/min — ABNORMAL LOW (ref >60)
Glucose, Bld: 82 mg/dL (ref 70–99)
Potassium: 4 mmol/L (ref 3.5–5.1)
Sodium: 139 mmol/L (ref 135–145)

## 2019-01-24 LAB — HEMOGLOBIN AND HEMATOCRIT, BLOOD
HCT: 30.6 % — ABNORMAL LOW (ref 36.0–46.0)
Hemoglobin: 9.1 g/dL — ABNORMAL LOW (ref 12.0–15.0)

## 2019-01-24 MED ORDER — SODIUM CHLORIDE 0.9 % IV SOLN
510.0000 mg | Freq: Once | INTRAVENOUS | Status: AC
  Administered 2019-01-24: 510 mg via INTRAVENOUS
  Filled 2019-01-24: qty 17

## 2019-01-24 MED ORDER — SODIUM CHLORIDE 0.9 % IV SOLN
INTRAVENOUS | Status: DC | PRN
  Administered 2019-01-24: 15 mL via INTRAVENOUS

## 2019-01-24 MED ORDER — OXYCODONE HCL 5 MG PO TABS
5.0000 mg | ORAL_TABLET | ORAL | Status: DC | PRN
  Administered 2019-01-25 (×3): 10 mg via ORAL
  Filled 2019-01-24 (×3): qty 2

## 2019-01-24 MED ORDER — TRAMADOL HCL 50 MG PO TABS
50.0000 mg | ORAL_TABLET | Freq: Three times a day (TID) | ORAL | Status: DC
  Administered 2019-01-24 – 2019-01-28 (×11): 50 mg via ORAL
  Filled 2019-01-24 (×11): qty 1

## 2019-01-24 MED ORDER — PEG 3350-KCL-NA BICARB-NACL 420 G PO SOLR
4000.0000 mL | Freq: Once | ORAL | Status: AC
  Administered 2019-01-25: 4000 mL via ORAL
  Filled 2019-01-24: qty 4000

## 2019-01-24 MED ORDER — FUROSEMIDE 40 MG PO TABS
40.0000 mg | ORAL_TABLET | Freq: Every day | ORAL | Status: DC
  Administered 2019-01-24 – 2019-01-28 (×5): 40 mg via ORAL
  Filled 2019-01-24 (×5): qty 1
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
## 2019-01-25 LAB — CBC
HCT: 29.8 % — ABNORMAL LOW (ref 36.0–46.0)
Hemoglobin: 8.9 g/dL — ABNORMAL LOW (ref 12.0–15.0)
MCH: 21.5 pg — ABNORMAL LOW (ref 26.0–34.0)
MCHC: 29.9 g/dL — ABNORMAL LOW (ref 30.0–36.0)
MCV: 72.2 fL — ABNORMAL LOW (ref 80.0–100.0)
Platelets: 443 10*3/uL — ABNORMAL HIGH (ref 150–400)
RBC: 4.13 MIL/uL (ref 3.87–5.11)
RDW: 22.6 % — ABNORMAL HIGH (ref 11.5–15.5)
WBC: 7.1 10*3/uL (ref 4.0–10.5)
nRBC: 0 % (ref 0.0–0.2)

## 2019-01-25 LAB — BASIC METABOLIC PANEL
Anion gap: 11 (ref 5–15)
BUN: 16 mg/dL (ref 8–23)
CO2: 22 mmol/L (ref 22–32)
Calcium: 9.3 mg/dL (ref 8.9–10.3)
Chloride: 102 mmol/L (ref 98–111)
Creatinine, Ser: 1.12 mg/dL — ABNORMAL HIGH (ref 0.44–1.00)
GFR calc Af Amer: 59 mL/min — ABNORMAL LOW (ref >60)
GFR calc non Af Amer: 51 mL/min — ABNORMAL LOW (ref >60)
Glucose, Bld: 93 mg/dL (ref 70–99)
Potassium: 4.2 mmol/L (ref 3.5–5.1)
Sodium: 135 mmol/L (ref 135–145)

## 2019-01-25 LAB — HEMOGLOBIN AND HEMATOCRIT, BLOOD
HCT: 34.8 % — ABNORMAL LOW (ref 36.0–46.0)
Hemoglobin: 10.2 g/dL — ABNORMAL LOW (ref 12.0–15.0)

## 2019-01-25 MED ORDER — SACUBITRIL-VALSARTAN 24-26 MG PO TABS
1.0000 | ORAL_TABLET | Freq: Every day | ORAL | Status: DC
  Administered 2019-01-25 – 2019-01-28 (×4): 1 via ORAL
  Filled 2019-01-25 (×4): qty 1
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
## 2019-01-25 NOTE — Progress Notes (Signed)
Barbara Lame, MD [facility]   [address] [place], [place] [address] Phone: [phone] Fax : [phone]   Subjective: The patient reports that she has not had any abdominal pain black stools or bloody stools.  The patient never had a colonoscopy in the past.  She has now been found to have anemia and was on Plavix.  The patient has no complaints today.   Objective: Vital signs in last 24 hours: Vitals:   01/24/19 1324 01/24/19 1649 01/24/19 1942 01/25/19 0646  BP: 136/67 (!) 154/69 129/63 (!) 150/87  Pulse: 79 75 72 70  Resp: 18  20   Temp: 98.1 F (36.7 C)  98 F (36.7 C) 97.6 F (36.4 C)  TempSrc: Oral  Oral Oral  SpO2: 99%  98% 100%  Weight:      Height:       Weight change:   Intake/Output Summary (Last 24 hours) at 01/25/2019 1324 Last data filed at 01/25/2019 0900 Gross per 24 hour  Intake 801.81 ml  Output -  Net 801.81 ml     Exam: Heart:: Regular rate and rhythm, S1S2 present or without murmur or extra heart sounds Lungs: normal and clear to auscultation and percussion Abdomen: soft, nontender, normal bowel sounds   Lab Results: @LABTEST2 @ Micro Results: Recent Results (from the past 240 hour(s))  SARS CORONAVIRUS 2 (TAT 6-24 HRS) Nasopharyngeal Nasopharyngeal Swab     Status: None   Collection Time: 01/22/19  8:39 PM   Specimen: Nasopharyngeal Swab  Result Value Ref Range Status   SARS Coronavirus 2 NEGATIVE NEGATIVE Final    Comment: (NOTE) SARS-CoV-2 target nucleic acids are NOT DETECTED. The SARS-CoV-2 RNA is generally detectable in upper and lower respiratory specimens during the acute phase of infection. Negative results do not preclude SARS-CoV-2 infection, do not rule out co-infections with other pathogens, and should not be used as the sole basis for treatment or other patient management decisions. Negative results must be combined with clinical observations, patient history, and epidemiological information. The expected result  is Negative. Fact Sheet for Patients: SugarRoll.be Fact Sheet for Healthcare Providers: https://www.woods-mathews.com/ This test is not yet approved or cleared by the [place] FDA and  has been authorized for detection and/or diagnosis of SARS-CoV-2 by FDA under an Emergency Use Authorization (EUA). This EUA will remain  in effect (meaning this test can be used) for the duration of the COVID-19 declaration under Section 56 4(b)(1) of the Act, 21 U.S.C. section 360bbb-3(b)(1), unless the authorization is terminated or revoked sooner. Performed at [facility], [place] [address], [place], [place] [address]    Studies/Results: No results found. Medications: I have reviewed the patient's current medications. Scheduled Meds: . acetaminophen  500 mg Oral TID  . aspirin  81 mg Oral Daily  . atorvastatin  80 mg Oral q1800  . carvedilol  12.5 mg Oral BID WC  . cyanocobalamin  1,000 mcg Subcutaneous Daily  . fluticasone  1 spray Each Nare Daily  . furosemide  40 mg Oral Daily  . mometasone-formoterol  2 puff Inhalation BID  . pantoprazole (PROTONIX) IV  40 mg Intravenous Q12H  . polyethylene glycol-electrolytes  4,000 mL Oral Once  . sacubitril-valsartan  1 tablet Oral Daily  . traMADol  50 mg Oral TID  . Vitamin D (Ergocalciferol)  50,000 Units Oral Q7 days   Continuous Infusions: . sodium chloride Stopped (01/24/19 1816)   PRN Meds:.sodium chloride, acetaminophen **OR** acetaminophen, albuterol, morphine injection, ondansetron **OR** ondansetron (ZOFRAN)  IV, oxyCODONE, traZODone, umeclidinium-vilanterol   Assessment: Principal Problem:   Symptomatic anemia Active Problems:   CKD (chronic kidney disease), stage III   NICM (nonischemic cardiomyopathy) (HCC)   Iron deficiency anemia   Low vitamin B12 level   Chronic diastolic CHF (congestive heart failure) (HCC)    Plan: The plan for tomorrow is to have an EGD and colonoscopy.   The patient is on clear liquids at the present time and will be starting a prep this evening.  The patient has been explained the plan to do an EGD and colonoscopy on her tomorrow and she agrees with it.   LOS: 2 days   Midge Minium 01/25/2019, 1:24 PM Pager [phone] 7am-5pm  Check AMION for 5pm -7am coverage and on weekends

## 2019-01-25 NOTE — Care Management Important Message (Signed)
Important Message  Patient Details  Name: Barbara Oliver MRN: [id number] Date of Birth: 11-05-1950   Medicare Important Message Given:  Yes     [name] 01/25/2019, 12:14 PM

## 2019-01-26 ENCOUNTER — Encounter
Admission: EM | Disposition: A | Discharge status: Home / Self Care | Payer: Self-pay | Source: Home / Self Care | Attending: Internal Medicine

## 2019-01-26 ENCOUNTER — Inpatient Hospital Stay: Payer: Medicare HMO | Admitting: Certified Registered"

## 2019-01-26 ENCOUNTER — Encounter: Payer: Self-pay | Admitting: Anesthesiology

## 2019-01-26 DIAGNOSIS — K31811 Angiodysplasia of stomach and duodenum with bleeding: Secondary | ICD-10-CM

## 2019-01-26 DIAGNOSIS — D62 Acute posthemorrhagic anemia: Secondary | ICD-10-CM

## 2019-01-26 HISTORY — PX: COLONOSCOPY WITH PROPOFOL: SHX5780

## 2019-01-26 HISTORY — PX: ESOPHAGOGASTRODUODENOSCOPY: SHX5428

## 2019-01-26 LAB — BASIC METABOLIC PANEL
Anion gap: 7 (ref 5–15)
BUN: 12 mg/dL (ref 8–23)
CO2: 27 mmol/L (ref 22–32)
Calcium: 9.4 mg/dL (ref 8.9–10.3)
Chloride: 103 mmol/L (ref 98–111)
Creatinine, Ser: 1.04 mg/dL — ABNORMAL HIGH (ref 0.44–1.00)
GFR calc Af Amer: >60 mL/min (ref >60)
GFR calc non Af Amer: 56 mL/min — ABNORMAL LOW (ref >60)
Glucose, Bld: 86 mg/dL (ref 70–99)
Potassium: 4.1 mmol/L (ref 3.5–5.1)
Sodium: 137 mmol/L (ref 135–145)

## 2019-01-26 LAB — HEMOGLOBIN AND HEMATOCRIT, BLOOD
HCT: 35.6 % — ABNORMAL LOW (ref 36.0–46.0)
Hemoglobin: 10.4 g/dL — ABNORMAL LOW (ref 12.0–15.0)

## 2019-01-26 LAB — CBC
HCT: 31.4 % — ABNORMAL LOW (ref 36.0–46.0)
Hemoglobin: 9.4 g/dL — ABNORMAL LOW (ref 12.0–15.0)
MCH: 21.7 pg — ABNORMAL LOW (ref 26.0–34.0)
MCHC: 29.9 g/dL — ABNORMAL LOW (ref 30.0–36.0)
MCV: 72.5 fL — ABNORMAL LOW (ref 80.0–100.0)
Platelets: 457 10*3/uL — ABNORMAL HIGH (ref 150–400)
RBC: 4.33 MIL/uL (ref 3.87–5.11)
RDW: 23.4 % — ABNORMAL HIGH (ref 11.5–15.5)
WBC: 8 10*3/uL (ref 4.0–10.5)
nRBC: 0.2 % (ref 0.0–0.2)

## 2019-01-26 LAB — MAGNESIUM: Magnesium: 2.1 mg/dL (ref 1.7–2.4)

## 2019-01-26 SURGERY — COLONOSCOPY WITH PROPOFOL
Anesthesia: General

## 2019-01-26 SURGERY — EGD (ESOPHAGOGASTRODUODENOSCOPY)
Anesthesia: General

## 2019-01-26 MED ORDER — PROPOFOL 10 MG/ML IV BOLUS
INTRAVENOUS | Status: DC | PRN
  Administered 2019-01-26: 60 mg via INTRAVENOUS
  Administered 2019-01-26 (×2): 10 mg via INTRAVENOUS

## 2019-01-26 MED ORDER — PROPOFOL 500 MG/50ML IV EMUL
INTRAVENOUS | Status: DC | PRN
  Administered 2019-01-26: 160 ug/kg/min via INTRAVENOUS

## 2019-01-26 MED ORDER — SODIUM CHLORIDE 0.9 % IV SOLN: INTRAVENOUS | Status: DC

## 2019-01-26 MED ORDER — GLYCOPYRROLATE 0.2 MG/ML IJ SOLN
INTRAMUSCULAR | Status: DC | PRN
  Administered 2019-01-26 (×2): 0.2 mg via INTRAVENOUS

## 2019-01-26 MED ORDER — PHENYLEPHRINE HCL (PRESSORS) 10 MG/ML IV SOLN
INTRAVENOUS | Status: DC | PRN
  Administered 2019-01-26 (×3): 100 ug via INTRAVENOUS

## 2019-01-26 MED ORDER — LIDOCAINE HCL (CARDIAC) PF 100 MG/5ML IV SOSY
PREFILLED_SYRINGE | INTRAVENOUS | Status: DC | PRN
  Administered 2019-01-26: 100 mg via INTRATRACHEAL

## 2019-01-26 MED ORDER — SODIUM CHLORIDE 0.9 % IV SOLN
INTRAVENOUS | Status: DC
  Administered 2019-01-26: 1000 mL via INTRAVENOUS
  Administered 2019-01-26: 12:00:00 via INTRAVENOUS

## 2019-01-26 MED ORDER — EPHEDRINE SULFATE 50 MG/ML IJ SOLN
INTRAMUSCULAR | Status: DC | PRN
  Administered 2019-01-26: 10 mg via INTRAVENOUS

## 2019-01-26 MED ORDER — IPRATROPIUM-ALBUTEROL 0.5-2.5 (3) MG/3ML IN SOLN
RESPIRATORY_TRACT | Status: AC
  Administered 2019-01-26: 3 mL
  Filled 2019-01-26: qty 3

## 2019-01-26 NOTE — Op Note (Signed)
[facility] Gastroenterology Patient Name: Barbara Oliver Procedure Date: 01/26/2019 12:26 PM MRN: [id number] Account #: [id number] Date of Birth: Sep 15, 1950 Admit Type: Outpatient Age: 68 Room: [facility] ENDO ROOM 4 Gender: Female Note Status: Finalized Procedure:             Colonoscopy Indications:           Acute post hemorrhagic anemia Providers:             Lucilla Lame MD, MD Medicines:             Propofol per Anesthesia Complications:         No immediate complications. Procedure:             Pre-Anesthesia Assessment:                        - Prior to the procedure, a History and Physical was                         performed, and patient medications and allergies were                         reviewed. The patient's tolerance of previous                         anesthesia was also reviewed. The risks and benefits                         of the procedure and the sedation options and risks                         were discussed with the patient. All questions were                         answered, and informed consent was obtained. Prior                         Anticoagulants: The patient has taken Plavix                         (clopidogrel), last dose was 5 days prior to                         procedure. ASA Grade Assessment: II - A patient with                         mild systemic disease. After reviewing the risks and                         benefits, the patient was deemed in satisfactory                         condition to undergo the procedure.                        After obtaining informed consent, the colonoscope was                         passed under direct vision. Throughout the procedure,  the patient's blood pressure, pulse, and oxygen                         saturations were monitored continuously. The                         Colonoscope was introduced through the anus and                         advanced to the the  cecum, identified by appendiceal                         orifice and ileocecal valve. The colonoscopy was                         performed without difficulty. The patient tolerated                         the procedure well. The quality of the bowel                         preparation was fair. Findings:      The perianal and digital rectal examinations were normal.      Non-bleeding internal hemorrhoids were found during retroflexion. The       hemorrhoids were Grade III (internal hemorrhoids that prolapse but       require manual reduction). Impression:            - Preparation of the colon was fair.                        - Non-bleeding internal hemorrhoids.                        - No specimens collected. Recommendation:        - Return patient to hospital ward for ongoing care.                        - Resume previous diet.                        - Continue present medications. Procedure Code(s):     --- Professional ---                        [phone], Colonoscopy, flexible; diagnostic, including                         collection of specimen(s) by brushing or washing, when                         performed (separate procedure) Diagnosis Code(s):     --- Professional ---                        D62, Acute posthemorrhagic anemia CPT copyright 2019 American Medical Association. All rights reserved. The codes documented in this report are preliminary and upon coder review may  be revised to meet current compliance requirements. Midge Minium MD, MD 01/26/2019 12:55:35 PM This report has been signed electronically. Number of Addenda: 0 Note Initiated On: 01/26/2019 12:26 PM  Scope Withdrawal Time: 0 hours 5 minutes 41 seconds  Total Procedure Duration: 0 hours 9 minutes 18 seconds  Estimated Blood Loss:  Estimated blood loss: none.      [facility]

## 2019-01-26 NOTE — Op Note (Signed)
[facility] Patient Name: Barbara Oliver Procedure Date: 01/26/2019 12:26 PM MRN: [id number] Account #: [id number] Date of Birth: Apr 25, 1950 Admit Type: Outpatient Age: 68 Room: [facility] ENDO ROOM 4 Gender: Female Note Status: Finalized Procedure:             Upper GI endoscopy Indications:           Acute post hemorrhagic anemia Providers:             Midge Minium MD, MD Medicines:             Propofol per Anesthesia Complications:         No immediate complications. Procedure:             Pre-Anesthesia Assessment:                        - Prior to the procedure, a History and Physical was                         performed, and patient medications and allergies were                         reviewed. The patient's tolerance of previous                         anesthesia was also reviewed. The risks and benefits                         of the procedure and the sedation options and risks                         were discussed with the patient. All questions were                         answered, and informed consent was obtained. Prior                         Anticoagulants: The patient has taken no previous                         anticoagulant or antiplatelet agents. ASA Grade                         Assessment: II - A patient with mild systemic disease.                         After reviewing the risks and benefits, the patient                         was deemed in satisfactory condition to undergo the                         procedure.                        After obtaining informed consent, the endoscope was                         passed under direct vision. Throughout the procedure,  the patient's blood pressure, pulse, and oxygen                         saturations were monitored continuously. The Endoscope                         was introduced through the mouth, and advanced to the                         second part of  duodenum. The upper GI endoscopy was                         accomplished without difficulty. The patient tolerated                         the procedure well. Findings:      The examined esophagus was normal.      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       Coagulation for hemostasis using argon plasma at 2 liters/minute and 60       watts was successful.      A single 2 mm angiodysplastic lesion without bleeding was found in the       duodenal bulb. Coagulation for hemostasis using argon plasma at 2       liters/minute and 60 watts was successful. Impression:            - Normal esophagus.                        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                         Treated with argon plasma coagulation (APC).                        - A single non-bleeding angiodysplastic lesion in the                         duodenum. Treated with argon plasma coagulation (APC).                        - No specimens collected. Recommendation:        - Return patient to hospital ward for ongoing care.                        - Resume previous diet.                        - Continue present medications.                        - Perform a colonoscopy today. Procedure Code(s):     --- Professional ---                        [phone], Esophagogastroduodenoscopy, flexible,                         transoral; with control of bleeding, any method Diagnosis Code(s):     --- Professional ---  D62, Acute posthemorrhagic anemia                        K31.811, Angiodysplasia of stomach and duodenum with                         bleeding CPT copyright 2019 American Medical Association. All rights reserved. The codes documented in this report are preliminary and upon coder review may  be revised to meet current compliance requirements. Midge Minium MD, MD 01/26/2019 12:42:41 PM This report has been signed electronically. Number of Addenda:  0 Note Initiated On: 01/26/2019 12:26 PM Estimated Blood Loss:  Estimated blood loss: none.      [facility]

## 2019-01-26 NOTE — Anesthesia Post-op Follow-up Note (Signed)
Anesthesia QCDR form completed.        
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
## 2019-01-26 NOTE — Anesthesia Preprocedure Evaluation (Signed)
Anesthesia Evaluation  Patient identified by MRN, date of birth, ID band Patient awake    Reviewed: Allergy & Precautions, H&P , NPO status , Patient's Chart, lab work & pertinent test results  History of Anesthesia Complications Negative for: history of anesthetic complications  Airway Mallampati: III  TM Distance: >3 FB Neck ROM: limited    Dental  (+) Chipped   Pulmonary shortness of breath, COPD, Current Smoker,           Cardiovascular Exercise Tolerance: Good (-) angina+CHF  (-) Past MI and (-) DOE + dysrhythmias      Neuro/Psych negative neurological ROS  negative psych ROS   GI/Hepatic Neg liver ROS, GERD  Medicated and Controlled,  Endo/Other  negative endocrine ROS  Renal/GU Renal disease  negative genitourinary   Musculoskeletal   Abdominal   Peds  Hematology negative hematology ROS (+)   Anesthesia Other Findings Past Medical History: 10/06/2015: Abdominal pain, epigastric No date: Acid reflux 03/19/2017: Acute on chronic systolic (congestive) heart failure (HCC) 10/06/2015: Acute renal insufficiency 04/26/2018: Acute respiratory failure with hypoxia (HCC) No date: Chronic systolic CHF (congestive heart failure) (HCC) No date: CKD (chronic kidney disease), stage III 10/06/2015: Congestive dilated cardiomyopathy (HCC) 04/25/2018: COPD with acute bronchitis (HCC) No date: Dyspnea 10/06/2015: Elevated transaminase level No date: History of noncompliance with medical treatment, presenting  hazards to health 10/06/2015: Hyperglycemia No date: LBBB (left bundle branch block) No date: Lung nodule     Comment:  a. seen on prior CT, f/u due 11/2017. No date: Mitral regurgitation No date: NICM (nonischemic cardiomyopathy) (HCC)     Comment:  a. prior hx of this, EF 20-25% in 11/2016. b. EF 30-35%               by cath 03/2017 with normal coronaries. 03/07/2016: Protein-calorie malnutrition, severe 04/25/2018:  Respiratory distress 10/06/2015: Severe mitral regurgitation No date: Tobacco abuse  Past Surgical History: No date: CARDIAC CATHETERIZATION 09/29/2018: ENDARTERECTOMY; Left     Comment:  Procedure: ENDARTERECTOMY CAROTID ARTERY LEFT;  Surgeon:              Chuck Hint, MD;  Location: [facility] OR;  Service:               Vascular;  Laterality: Left; No date: HERNIA REPAIR 09/29/2018: PATCH ANGIOPLASTY; Left     Comment:  Procedure: Patch Angioplasty Left Carotid Artery using               Xenosure Biologic Patch;  Surgeon: Chuck Hint, MD;  Location: [facility] OR;  Service: Vascular;  Laterality:              Left; 03/21/2017: RIGHT/LEFT HEART CATH AND CORONARY ANGIOGRAPHY; N/A     Comment:  Procedure: RIGHT/LEFT HEART CATH AND CORONARY               ANGIOGRAPHY;  Surgeon: Dolores Patty, MD;                Location: [facility] INVASIVE CV LAB;  Service: Cardiovascular;                Laterality: N/A;  BMI    Body Mass Index: 31.80 kg/m      Reproductive/Obstetrics negative OB ROS                             Anesthesia  Physical Anesthesia Plan  ASA: IV  Anesthesia Plan: General   Post-op Pain Management:    Induction: Intravenous  PONV Risk Score and Plan: Propofol infusion and TIVA  Airway Management Planned: Natural Airway and Nasal Cannula  Additional Equipment:   Intra-op Plan:   Post-operative Plan:   Informed Consent: I have reviewed the patients History and Physical, chart, labs and discussed the procedure including the risks, benefits and alternatives for the proposed anesthesia with the patient or authorized representative who has indicated his/her understanding and acceptance.     Dental Advisory Given  Plan Discussed with: Anesthesiologist, CRNA and Surgeon  Anesthesia Plan Comments: (Patient consented for risks of anesthesia including but not limited to:  - adverse reactions to medications - risk of  intubation if required - damage to teeth, lips or other oral mucosa - sore throat or hoarseness - Damage to heart, brain, lungs or loss of life  Patient voiced understanding.)        Anesthesia Quick Evaluation

## 2019-01-26 NOTE — Progress Notes (Signed)
Patient had a lot of coughing post procedure. She denied having shortness of breath, but did admit to having COPD and bronchitis. A duoneb svn was ordered and was administered at 1324. Less coughing noted post procedure.

## 2019-01-26 NOTE — Transfer of Care (Signed)
Immediate Anesthesia Transfer of Care Note  Patient: Barbara Oliver  Procedure(s) Performed: ESOPHAGOGASTRODUODENOSCOPY (EGD) (N/A ) COLONOSCOPY WITH PROPOFOL (N/A )  Patient Location: Endoscopy Unit  Anesthesia Type:General  Level of Consciousness: drowsy and responds to stimulation  Airway & Oxygen Therapy: Patient Spontanous Breathing and Patient connected to face mask oxygen  Post-op Assessment: Report given to RN and Post -op Vital signs reviewed and stable  Post vital signs: Reviewed and stable  Last Vitals:  Vitals Value Taken Time  BP 155/91 01/26/19 1258  Temp 35.9 C 01/26/19 1258  Pulse 79 01/26/19 1259  Resp 15 01/26/19 1259  SpO2 96 % 01/26/19 1259  Vitals shown include unvalidated device data.  Last Pain:  Vitals:   01/26/19 1258  TempSrc: Tympanic  PainSc:       Patients Stated Pain Goal: 0 (85/88/50 2774)  Complications: No apparent anesthesia complications

## 2019-01-27 ENCOUNTER — Encounter: Payer: Self-pay | Admitting: Gastroenterology

## 2019-01-27 LAB — BASIC METABOLIC PANEL
Anion gap: 14 (ref 5–15)
BUN: 23 mg/dL (ref 8–23)
CO2: 25 mmol/L (ref 22–32)
Calcium: 9.4 mg/dL (ref 8.9–10.3)
Chloride: 97 mmol/L — ABNORMAL LOW (ref 98–111)
Creatinine, Ser: 1.4 mg/dL — ABNORMAL HIGH (ref 0.44–1.00)
GFR calc Af Amer: 45 mL/min — ABNORMAL LOW (ref >60)
GFR calc non Af Amer: 39 mL/min — ABNORMAL LOW (ref >60)
Glucose, Bld: 124 mg/dL — ABNORMAL HIGH (ref 70–99)
Potassium: 3.8 mmol/L (ref 3.5–5.1)
Sodium: 136 mmol/L (ref 135–145)

## 2019-01-27 LAB — HEMOGLOBIN AND HEMATOCRIT, BLOOD
HCT: 31.8 % — ABNORMAL LOW (ref 36.0–46.0)
Hemoglobin: 9.1 g/dL — ABNORMAL LOW (ref 12.0–15.0)

## 2019-01-27 LAB — CBC
HCT: 29.6 % — ABNORMAL LOW (ref 36.0–46.0)
Hemoglobin: 8.9 g/dL — ABNORMAL LOW (ref 12.0–15.0)
MCH: 21.6 pg — ABNORMAL LOW (ref 26.0–34.0)
MCHC: 30.1 g/dL (ref 30.0–36.0)
MCV: 71.8 fL — ABNORMAL LOW (ref 80.0–100.0)
Platelets: 475 10*3/uL — ABNORMAL HIGH (ref 150–400)
RBC: 4.12 MIL/uL (ref 3.87–5.11)
RDW: 24 % — ABNORMAL HIGH (ref 11.5–15.5)
WBC: 10.2 10*3/uL (ref 4.0–10.5)
nRBC: 0.2 % (ref 0.0–0.2)

## 2019-01-27 MED ORDER — PANTOPRAZOLE SODIUM 40 MG PO TBEC
40.0000 mg | DELAYED_RELEASE_TABLET | Freq: Two times a day (BID) | ORAL | Status: DC
  Administered 2019-01-27 – 2019-01-28 (×2): 40 mg via ORAL
  Filled 2019-01-27 (×2): qty 1

## 2019-01-27 NOTE — Progress Notes (Signed)
PHARMACIST - PHYSICIAN COMMUNICATION  DR:   Arbutus Ped  CONCERNING: IV to Oral Route Change Policy  RECOMMENDATION: This patient is receiving pantoprazole by the intravenous route.  Based on criteria approved by the Pharmacy and Therapeutics Committee, the intravenous medication(s) is/are being converted to the equivalent oral dose form(s).   DESCRIPTION: These criteria include:  The patient is eating (either orally or via tube) and/or has been taking other orally administered medications for a least 24 hours  The patient has no evidence of active gastrointestinal bleeding or impaired GI absorption (gastrectomy, short bowel, patient on TNA or NPO).  If you have questions about this conversion, please contact the Pharmacy Department  []   [phone] )  [facility] [x]   [phone] )  [facility] []   [phone] )  [facility] []   [phone] )  [facility] []   [phone] )  [place], [facility] 01/27/2019 11:59 AM
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
## 2019-01-27 NOTE — Anesthesia Postprocedure Evaluation (Signed)
Anesthesia Post Note  Patient: Barbara Oliver  Procedure(s) Performed: ESOPHAGOGASTRODUODENOSCOPY (EGD) (N/A ) COLONOSCOPY WITH PROPOFOL (N/A )  Patient location during evaluation: Endoscopy Anesthesia Type: General Level of consciousness: awake and alert Pain management: pain level controlled Vital Signs Assessment: post-procedure vital signs reviewed and stable Respiratory status: spontaneous breathing, nonlabored ventilation, respiratory function stable and patient connected to nasal cannula oxygen Cardiovascular status: blood pressure returned to baseline and stable Postop Assessment: no apparent nausea or vomiting Anesthetic complications: no     Last Vitals:  Vitals:   01/26/19 2024 01/27/19 0426  BP: (!) 110/59 122/60  Pulse: 73 72  Resp: 18 20  Temp: 36.6 C 36.7 C  SpO2: 100% 97%    Last Pain:  Vitals:   01/27/19 0426  TempSrc: Oral  PainSc:                  [name]
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
## 2019-01-28 LAB — BASIC METABOLIC PANEL
Anion gap: 11 (ref 5–15)
BUN: 23 mg/dL (ref 8–23)
CO2: 24 mmol/L (ref 22–32)
Calcium: 9.7 mg/dL (ref 8.9–10.3)
Chloride: 100 mmol/L (ref 98–111)
Creatinine, Ser: 1.13 mg/dL — ABNORMAL HIGH (ref 0.44–1.00)
GFR calc Af Amer: 58 mL/min — ABNORMAL LOW (ref >60)
GFR calc non Af Amer: 50 mL/min — ABNORMAL LOW (ref >60)
Glucose, Bld: 108 mg/dL — ABNORMAL HIGH (ref 70–99)
Potassium: 4.6 mmol/L (ref 3.5–5.1)
Sodium: 135 mmol/L (ref 135–145)

## 2019-01-28 LAB — CBC
HCT: 33.1 % — ABNORMAL LOW (ref 36.0–46.0)
Hemoglobin: 9.6 g/dL — ABNORMAL LOW (ref 12.0–15.0)
MCH: 22.1 pg — ABNORMAL LOW (ref 26.0–34.0)
MCHC: 29 g/dL — ABNORMAL LOW (ref 30.0–36.0)
MCV: 76.1 fL — ABNORMAL LOW (ref 80.0–100.0)
Platelets: 486 10*3/uL — ABNORMAL HIGH (ref 150–400)
RBC: 4.35 MIL/uL (ref 3.87–5.11)
RDW: 24.6 % — ABNORMAL HIGH (ref 11.5–15.5)
WBC: 9.5 10*3/uL (ref 4.0–10.5)
nRBC: 0 % (ref 0.0–0.2)

## 2019-01-28 MED ORDER — CYANOCOBALAMIN 500 MCG PO TABS: 500.0000 ug | ORAL_TABLET | Freq: Every day | ORAL | 1 refills | Status: AC

## 2019-01-28 NOTE — Progress Notes (Signed)
Discharge instructions reviewed with patient including followup visits and new medications.  Understanding was verbalized and all questions were answered.  Patient discharged home ambulatory in stable condition.

## 2019-01-28 NOTE — Care Management Important Message (Signed)
Important Message  Patient Details  Name: CASIA CORTI MRN: [id number] Date of Birth: Feb 11, 1951   Medicare Important Message Given:  No  Patient discharged prior to arrival to unit to deliver concurrent Medicare IM.  [name] 01/28/2019, 1:29 PM

## 2019-01-28 NOTE — Discharge Instructions (Signed)
Anemia  Anemia is a condition in which you do not have enough red blood cells or hemoglobin. Hemoglobin is a substance in red blood cells that carries oxygen. When you do not have enough red blood cells or hemoglobin (are anemic), your body cannot get enough oxygen and your organs may not work properly. As a result, you may feel very tired or have other problems. What are the causes? Common causes of anemia include:  Excessive bleeding. Anemia can be caused by excessive bleeding inside or outside the body, including bleeding from the intestine or from periods in women.  Poor nutrition.  Long-lasting (chronic) kidney, thyroid, and liver disease.  Bone marrow disorders.  Cancer and treatments for cancer.  HIV (human immunodeficiency virus) and AIDS (acquired immunodeficiency syndrome).  Treatments for HIV and AIDS.  Spleen problems.  Blood disorders.  Infections, medicines, and autoimmune disorders that destroy red blood cells. What are the signs or symptoms? Symptoms of this condition include:  Minor weakness.  Dizziness.  Headache.  Feeling heartbeats that are irregular or faster than normal (palpitations).  Shortness of breath, especially with exercise.  Paleness.  Cold sensitivity.  Indigestion.  Nausea.  Difficulty sleeping.  Difficulty concentrating. Symptoms may occur suddenly or develop slowly. If your anemia is mild, you may not have symptoms. How is this diagnosed? This condition is diagnosed based on:  Blood tests.  Your medical history.  A physical exam.  Bone marrow biopsy. Your health care provider may also check your stool (feces) for blood and may do additional testing to look for the cause of your bleeding. You may also have other tests, including:  Imaging tests, such as a CT scan or MRI.  Endoscopy.  Colonoscopy. How is this treated? Treatment for this condition depends on the cause. If you continue to lose a lot of blood, you may  need to be treated at a hospital. Treatment may include:  Taking supplements of iron, vitamin Y60, or folic acid.  Taking a hormone medicine (erythropoietin) that can help to stimulate red blood cell growth.  Having a blood transfusion. This may be needed if you lose a lot of blood.  Making changes to your diet.  Having surgery to remove your spleen. Follow these instructions at home:  Take over-the-counter and prescription medicines only as told by your health care provider.  Take supplements only as told by your health care provider.  Follow any diet instructions that you were given.  Keep all follow-up visits as told by your health care provider. This is important. Contact a health care provider if:  You develop new bleeding anywhere in the body. Get help right away if:  You are very weak.  You are short of breath.  You have pain in your abdomen or chest.  You are dizzy or feel faint.  You have trouble concentrating.  You have bloody or black, tarry stools.  You vomit repeatedly or you vomit up blood. Summary  Anemia is a condition in which you do not have enough red blood cells or enough of a substance in your red blood cells that carries oxygen (hemoglobin).  Symptoms may occur suddenly or develop slowly.  If your anemia is mild, you may not have symptoms.  This condition is diagnosed with blood tests as well as a medical history and physical exam. Other tests may be needed.  Treatment for this condition depends on the cause of the anemia. This information is not intended to replace advice given to you by  your health care provider. Make sure you discuss any questions you have with your health care provider. °Document Released: 04/04/2004 Document Revised: 02/07/2017 Document Reviewed: 03/29/2016 °Elsevier Patient Education © 2020 [employer] ° °
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
## 2019-02-10 ENCOUNTER — Telehealth: Payer: Self-pay | Admitting: *Deleted

## 2019-02-10 NOTE — Telephone Encounter (Signed)
Left message for patient to call and reschedule CT chest

## 2019-02-22 ENCOUNTER — Encounter: Payer: Self-pay | Admitting: Gastroenterology

## 2019-02-22 ENCOUNTER — Ambulatory Visit: Payer: Medicare HMO | Admitting: Gastroenterology

## 2019-03-29 ENCOUNTER — Other Ambulatory Visit: Payer: Self-pay

## 2019-03-29 ENCOUNTER — Emergency Department: Payer: Medicare HMO

## 2019-03-29 ENCOUNTER — Encounter: Payer: Self-pay | Admitting: Emergency Medicine

## 2019-03-29 ENCOUNTER — Emergency Department
Admission: EM | Admit: 2019-03-29 | Discharge: 2019-03-29 | Disposition: A | Discharge status: Home / Self Care | Payer: Medicare HMO | Attending: Emergency Medicine | Admitting: Emergency Medicine

## 2019-03-29 DIAGNOSIS — N183 Chronic kidney disease, stage 3 unspecified: Secondary | ICD-10-CM | POA: Insufficient documentation

## 2019-03-29 DIAGNOSIS — R0602 Shortness of breath: Secondary | ICD-10-CM | POA: Insufficient documentation

## 2019-03-29 DIAGNOSIS — I5023 Acute on chronic systolic (congestive) heart failure: Secondary | ICD-10-CM

## 2019-03-29 DIAGNOSIS — R0789 Other chest pain: Secondary | ICD-10-CM | POA: Insufficient documentation

## 2019-03-29 DIAGNOSIS — Z20822 Contact with and (suspected) exposure to covid-19: Secondary | ICD-10-CM | POA: Insufficient documentation

## 2019-03-29 DIAGNOSIS — J449 Chronic obstructive pulmonary disease, unspecified: Secondary | ICD-10-CM | POA: Insufficient documentation

## 2019-03-29 LAB — CBC
HCT: 38.6 % (ref 36.0–46.0)
Hemoglobin: 11.3 g/dL — ABNORMAL LOW (ref 12.0–15.0)
MCH: 24.4 pg — ABNORMAL LOW (ref 26.0–34.0)
MCHC: 29.3 g/dL — ABNORMAL LOW (ref 30.0–36.0)
MCV: 83.4 fL (ref 80.0–100.0)
Platelets: 417 10*3/uL — ABNORMAL HIGH (ref 150–400)
RBC: 4.63 MIL/uL (ref 3.87–5.11)
RDW: 23.5 % — ABNORMAL HIGH (ref 11.5–15.5)
WBC: 7.7 10*3/uL (ref 4.0–10.5)
nRBC: 0 % (ref 0.0–0.2)

## 2019-03-29 LAB — BASIC METABOLIC PANEL
Anion gap: 9 (ref 5–15)
BUN: 23 mg/dL (ref 8–23)
CO2: 24 mmol/L (ref 22–32)
Calcium: 9.2 mg/dL (ref 8.9–10.3)
Chloride: 105 mmol/L (ref 98–111)
Creatinine, Ser: 1.1 mg/dL — ABNORMAL HIGH (ref 0.44–1.00)
GFR calc Af Amer: >60 mL/min (ref >60)
GFR calc non Af Amer: 52 mL/min — ABNORMAL LOW (ref >60)
Glucose, Bld: 97 mg/dL (ref 70–99)
Potassium: 3.9 mmol/L (ref 3.5–5.1)
Sodium: 138 mmol/L (ref 135–145)

## 2019-03-29 LAB — RESPIRATORY PANEL BY RT PCR (FLU A&B, COVID)
Influenza A by PCR: NEGATIVE
Influenza B by PCR: NEGATIVE
SARS Coronavirus 2 by RT PCR: NEGATIVE

## 2019-03-29 LAB — TROPONIN I (HIGH SENSITIVITY)
Troponin I (High Sensitivity): 13 ng/L (ref <18)
Troponin I (High Sensitivity): 14 ng/L (ref <18)

## 2019-03-29 LAB — BRAIN NATRIURETIC PEPTIDE: B Natriuretic Peptide: 459 pg/mL — ABNORMAL HIGH (ref 0.0–100.0)

## 2019-03-29 MED ORDER — LORAZEPAM 1 MG PO TABS: 1.0000 mg | ORAL_TABLET | Freq: Two times a day (BID) | ORAL | 0 refills | Status: AC

## 2019-03-29 MED ORDER — FUROSEMIDE 10 MG/ML IJ SOLN
80.0000 mg | Freq: Once | INTRAMUSCULAR | Status: AC
  Administered 2019-03-29: 12:00:00 80 mg via INTRAVENOUS
  Filled 2019-03-29: qty 8

## 2019-03-29 MED ORDER — MORPHINE SULFATE (PF) 4 MG/ML IV SOLN
4.0000 mg | Freq: Once | INTRAVENOUS | Status: AC
  Administered 2019-03-29: 12:00:00 4 mg via INTRAVENOUS
  Filled 2019-03-29: qty 1

## 2019-03-29 MED ORDER — LORAZEPAM 2 MG/ML IJ SOLN
0.5000 mg | Freq: Once | INTRAMUSCULAR | Status: AC
  Administered 2019-03-29: 14:00:00 0.5 mg via INTRAVENOUS
  Filled 2019-03-29: qty 1

## 2019-03-29 NOTE — ED Triage Notes (Signed)
Pt reports chest tightness and SOB since Friday. Pt reports a productive cough as well with gray sputum. Pt reports hx of bronchitis and has used her breathing treatments at home.

## 2019-03-29 NOTE — ED Notes (Signed)
Pt calling ride.

## 2019-03-29 NOTE — ED Notes (Signed)
Second EKG was completed per request of Dr. Colon Branch.
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
## 2019-03-29 NOTE — ED Notes (Signed)
Esign not working at this time. Pt verbalized discharge instructions and has no questions at this time. 

## 2019-03-30 IMAGING — CR DG CHEST 2V
2 series · 2 of 2 positions shown · non-contrast
Comparison: Chest x-ray 01/28/2017.

CLINICAL DATA: 66-year-old female with history of severe shortness
of breath for the past 3 days, worse on exertion. Bilateral lower
extremity swelling over the past several days. History of congestive
heart failure and mitral regurgitation.

EXAM:
CHEST  2 VIEW

[chest lat]
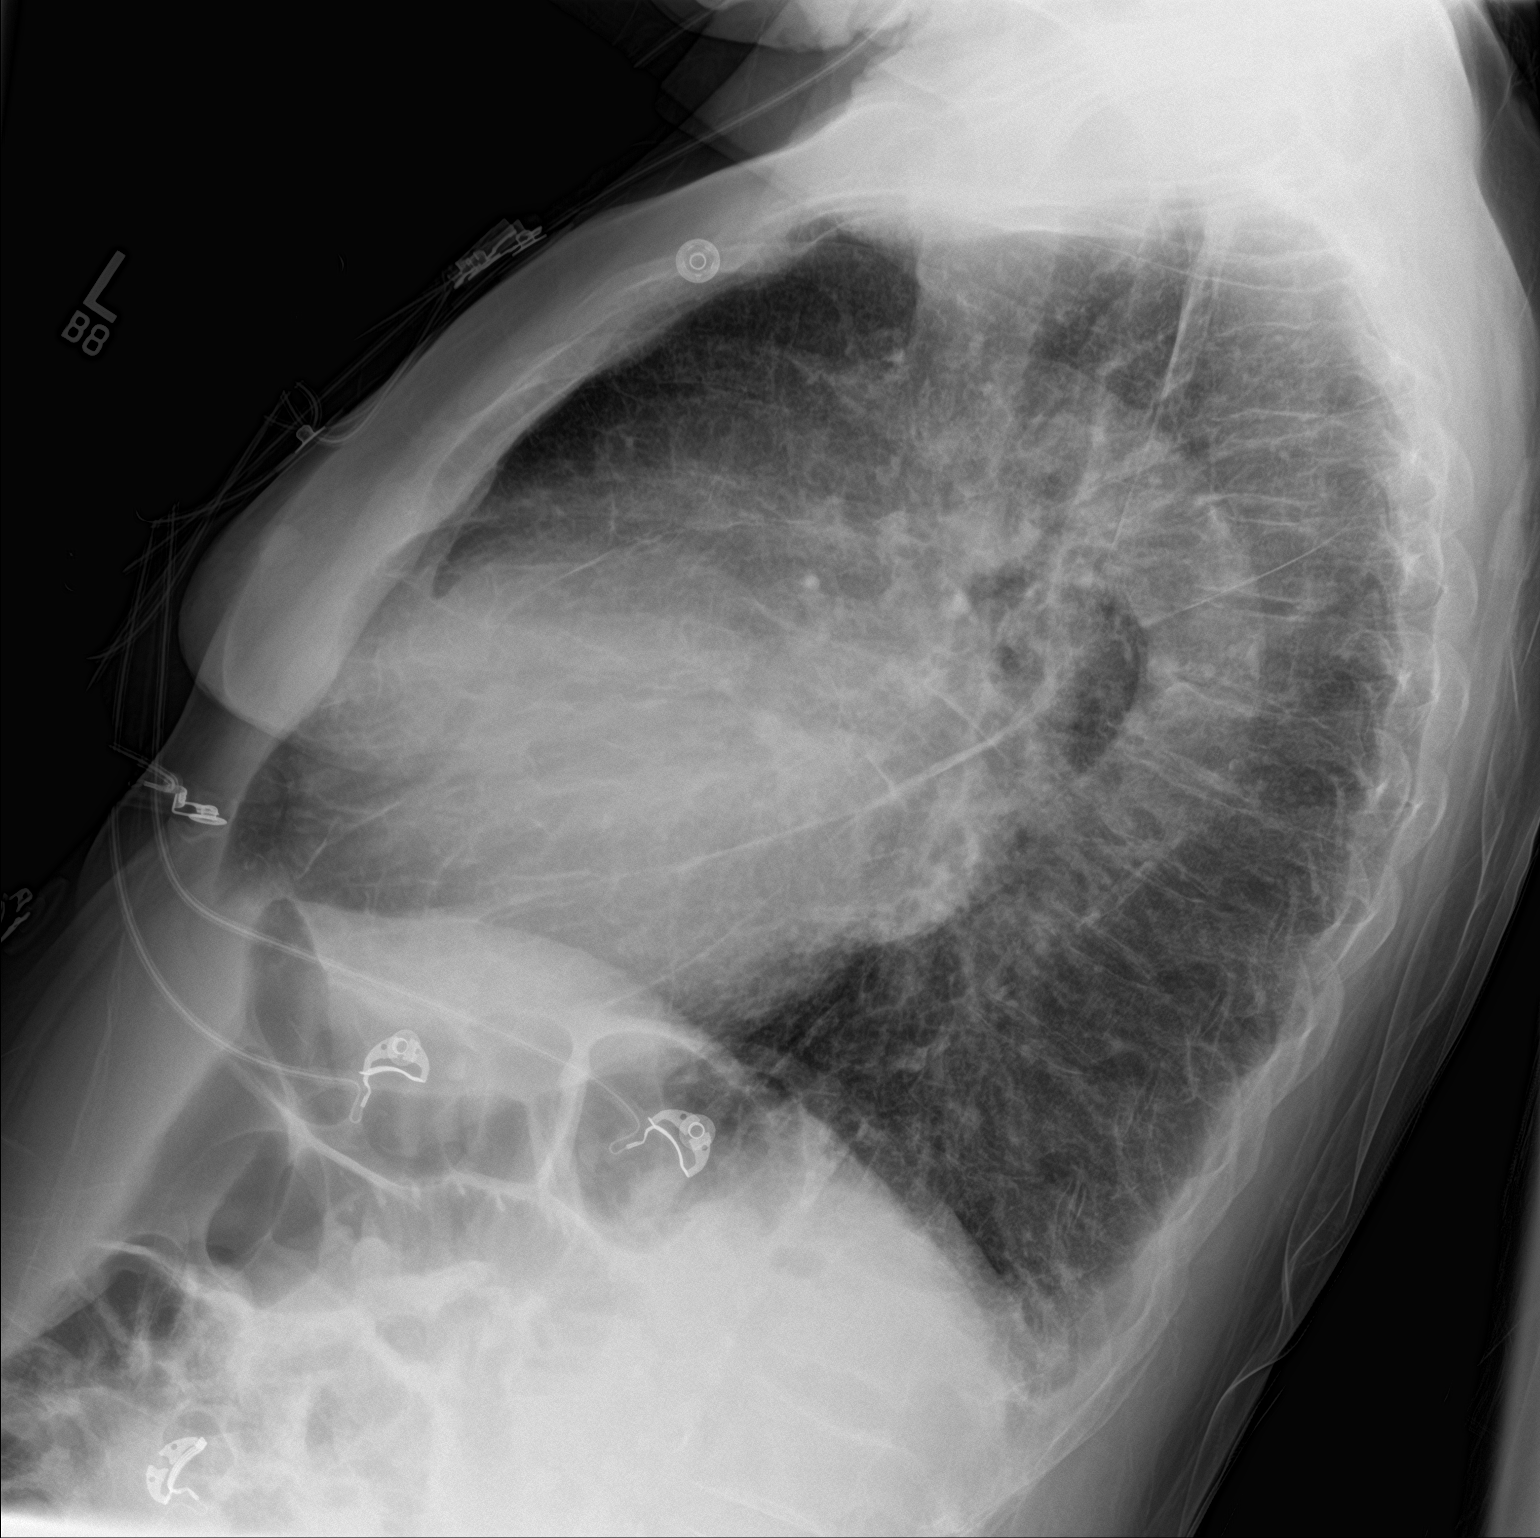

[chest ap]
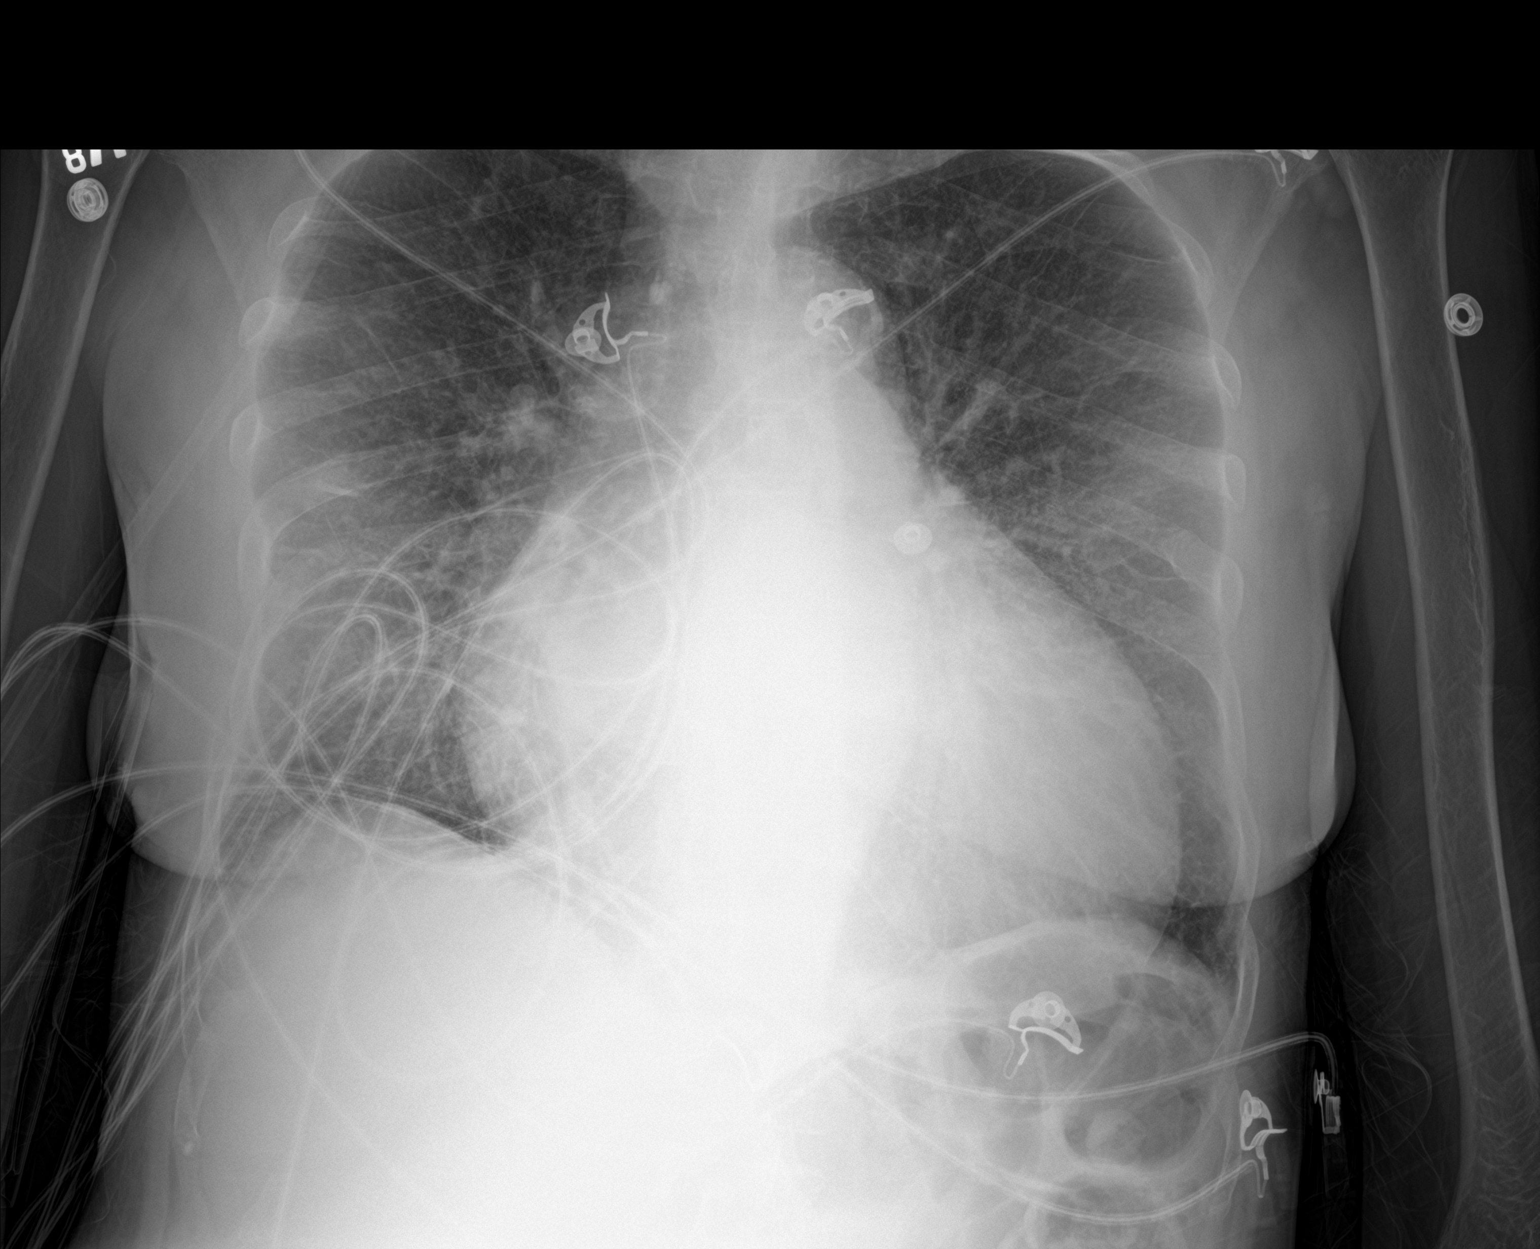

[2 of 2 positions shown; findings below may reference images not displayed]

FINDINGS: Mild diffuse peribronchial cuffing and interstitial prominence. Mild
cephalization of the pulmonary vasculature. No acute consolidative
airspace disease. No pleural effusions. However, there thickening of
the major and minor fissures. Moderate cardiomegaly. Upper
mediastinal contours are within normal limits. Aortic
atherosclerosis.
IMPRESSION: 1. The appearance the chest suggests mild congestive heart failure.
2. Aortic atherosclerosis.

## 2019-03-31 ENCOUNTER — Emergency Department
Admission: EM | Admit: 2019-03-31 | Discharge: 2019-03-31 | Disposition: A | Discharge status: Home / Self Care | Payer: Medicare HMO | Attending: Student | Admitting: Student

## 2019-03-31 ENCOUNTER — Telehealth: Payer: Self-pay | Admitting: Family

## 2019-03-31 ENCOUNTER — Emergency Department: Payer: Medicare HMO

## 2019-03-31 ENCOUNTER — Other Ambulatory Visit: Payer: Self-pay

## 2019-03-31 DIAGNOSIS — I5022 Chronic systolic (congestive) heart failure: Secondary | ICD-10-CM | POA: Diagnosis not present

## 2019-03-31 DIAGNOSIS — J449 Chronic obstructive pulmonary disease, unspecified: Secondary | ICD-10-CM | POA: Diagnosis not present

## 2019-03-31 DIAGNOSIS — N183 Chronic kidney disease, stage 3 unspecified: Secondary | ICD-10-CM | POA: Diagnosis not present

## 2019-03-31 DIAGNOSIS — R05 Cough: Secondary | ICD-10-CM | POA: Diagnosis not present

## 2019-03-31 DIAGNOSIS — F17210 Nicotine dependence, cigarettes, uncomplicated: Secondary | ICD-10-CM | POA: Diagnosis not present

## 2019-03-31 DIAGNOSIS — Z7982 Long term (current) use of aspirin: Secondary | ICD-10-CM | POA: Diagnosis not present

## 2019-03-31 DIAGNOSIS — Z20822 Contact with and (suspected) exposure to covid-19: Secondary | ICD-10-CM | POA: Insufficient documentation

## 2019-03-31 DIAGNOSIS — R0602 Shortness of breath: Secondary | ICD-10-CM | POA: Diagnosis present

## 2019-03-31 DIAGNOSIS — R059 Cough, unspecified: Secondary | ICD-10-CM

## 2019-03-31 DIAGNOSIS — Z79899 Other long term (current) drug therapy: Secondary | ICD-10-CM | POA: Insufficient documentation

## 2019-03-31 LAB — BASIC METABOLIC PANEL
Anion gap: 11 (ref 5–15)
BUN: 23 mg/dL (ref 8–23)
CO2: 24 mmol/L (ref 22–32)
Calcium: 9.8 mg/dL (ref 8.9–10.3)
Chloride: 101 mmol/L (ref 98–111)
Creatinine, Ser: 1.1 mg/dL — ABNORMAL HIGH (ref 0.44–1.00)
GFR calc Af Amer: >60 mL/min (ref >60)
GFR calc non Af Amer: 52 mL/min — ABNORMAL LOW (ref >60)
Glucose, Bld: 116 mg/dL — ABNORMAL HIGH (ref 70–99)
Potassium: 4.3 mmol/L (ref 3.5–5.1)
Sodium: 136 mmol/L (ref 135–145)

## 2019-03-31 LAB — CBC WITH DIFFERENTIAL/PLATELET
Abs Immature Granulocytes: 0.02 10*3/uL (ref 0.00–0.07)
Basophils Absolute: 0.1 10*3/uL (ref 0.0–0.1)
Basophils Relative: 1 %
Eosinophils Absolute: 0.2 10*3/uL (ref 0.0–0.5)
Eosinophils Relative: 2 %
HCT: 39.1 % (ref 36.0–46.0)
Hemoglobin: 11.6 g/dL — ABNORMAL LOW (ref 12.0–15.0)
Immature Granulocytes: 0 %
Lymphocytes Relative: 24 %
Lymphs Abs: 2.2 10*3/uL (ref 0.7–4.0)
MCH: 24.4 pg — ABNORMAL LOW (ref 26.0–34.0)
MCHC: 29.7 g/dL — ABNORMAL LOW (ref 30.0–36.0)
MCV: 82.3 fL (ref 80.0–100.0)
Monocytes Absolute: 0.8 10*3/uL (ref 0.1–1.0)
Monocytes Relative: 9 %
Neutro Abs: 5.8 10*3/uL (ref 1.7–7.7)
Neutrophils Relative %: 64 %
Platelets: 451 10*3/uL — ABNORMAL HIGH (ref 150–400)
RBC: 4.75 MIL/uL (ref 3.87–5.11)
RDW: 23.3 % — ABNORMAL HIGH (ref 11.5–15.5)
Smear Review: NORMAL
WBC: 9.1 10*3/uL (ref 4.0–10.5)
nRBC: 0 % (ref 0.0–0.2)

## 2019-03-31 LAB — PROCALCITONIN: Procalcitonin: <0.1 ng/mL

## 2019-03-31 LAB — BRAIN NATRIURETIC PEPTIDE: B Natriuretic Peptide: 542 pg/mL — ABNORMAL HIGH (ref 0.0–100.0)

## 2019-03-31 LAB — TROPONIN I (HIGH SENSITIVITY)
Troponin I (High Sensitivity): 15 ng/L (ref <18)
Troponin I (High Sensitivity): 16 ng/L (ref <18)

## 2019-03-31 LAB — POC SARS CORONAVIRUS 2 AG: SARS Coronavirus 2 Ag: NEGATIVE

## 2019-03-31 MED ORDER — BENZONATATE 100 MG PO CAPS: 100.0000 mg | ORAL_CAPSULE | Freq: Three times a day (TID) | ORAL | 0 refills | Status: DC | PRN

## 2019-03-31 MED ORDER — BENZONATATE 100 MG PO CAPS
100.0000 mg | ORAL_CAPSULE | Freq: Once | ORAL | Status: AC
  Administered 2019-03-31: 19:00:00 100 mg via ORAL

## 2019-03-31 MED ORDER — FUROSEMIDE 10 MG/ML IJ SOLN
60.0000 mg | Freq: Once | INTRAMUSCULAR | Status: AC
  Administered 2019-03-31: 17:00:00 60 mg via INTRAVENOUS
  Filled 2019-03-31: qty 8

## 2019-03-31 MED ORDER — PREDNISONE 20 MG PO TABS: 40.0000 mg | ORAL_TABLET | Freq: Every day | ORAL | 0 refills | Status: DC

## 2019-03-31 MED ORDER — LORAZEPAM 2 MG/ML IJ SOLN
0.5000 mg | Freq: Once | INTRAMUSCULAR | Status: AC
  Administered 2019-03-31: 17:00:00 0.5 mg via INTRAVENOUS
  Filled 2019-03-31: qty 1

## 2019-03-31 MED ORDER — PREDNISONE 20 MG PO TABS: 40.0000 mg | ORAL_TABLET | Freq: Every day | ORAL | 0 refills | Status: AC

## 2019-03-31 MED ORDER — PREDNISONE 20 MG PO TABS
20.0000 mg | ORAL_TABLET | Freq: Once | ORAL | Status: AC
  Administered 2019-03-31: 19:00:00 20 mg via ORAL

## 2019-03-31 MED ORDER — BUDESONIDE-FORMOTEROL FUMARATE 160-4.5 MCG/ACT IN AERO: 2.0000 | INHALATION_SPRAY | Freq: Two times a day (BID) | RESPIRATORY_TRACT | 1 refills | Status: AC
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
## 2019-03-31 NOTE — ED Triage Notes (Signed)
Pt c/o cough worse at night, states she was seen for the same on Monday and given lasix and sent home, states she is going to come everyday until someone figures out what's wrong.. states she is not getting any sleep at night, pt is in NAD at present

## 2019-03-31 NOTE — ED Notes (Signed)
Pt refusing to sign discharge and sts, "I aint signing anything, cause yall wont keep me. I just want to sleep. I need to stay for rest." Pt educated that her symptoms at the ED have improved and will continue when she gets her prescribed medicine and takes full course. Pt reminded if symptoms worsen or continue without resolve, to return to ED. Pt takes discharge paperwork and prescription information.

## 2019-03-31 NOTE — ED Notes (Signed)
Pt has hx of CHF and states fluid has built up in her lungs and states now she has bronchitits. Pt states her cough has kept her up w/o sleep x 3 days. Pt states she is compliasnt w/ her meds.

## 2019-03-31 NOTE — Discharge Instructions (Signed)
Thank you for letting us take care of you in the emergency department today.   Please continue to take any regular, prescribed medications. For the next three days, take your Lasix morning and night, then go back to taking once daily, as prescribed.   New medications we have prescribed:  - Prednisone - steroid medication to help with cough/COPD - Tessalon Perles - to help with cough. If this is not financially feasible, try finding over the counter Delsym.   Please follow up with: - Clarisa Kindred, of the Heart Failure Clinic to review your ER visit and follow up on your symptoms.   Please return to the ER for any new or worsening symptoms.

## 2019-03-31 NOTE — Telephone Encounter (Signed)
Called and LVM for patient to return our call and schedule a New Patient Heart Failure Clinic appt after we received an ED Referral.   Deetta Perla, NT

## 2019-04-01 LAB — SARS CORONAVIRUS 2 (TAT 6-24 HRS): SARS Coronavirus 2: NEGATIVE

## 2019-04-10 ENCOUNTER — Inpatient Hospital Stay
Admission: EM | Admit: 2019-04-10 | Discharge: 2019-04-16 | DRG: 291 | Disposition: A | Discharge status: Home / Self Care | Payer: Medicare HMO | Attending: Internal Medicine | Admitting: Internal Medicine

## 2019-04-10 ENCOUNTER — Other Ambulatory Visit: Payer: Self-pay

## 2019-04-10 ENCOUNTER — Emergency Department: Payer: Medicare HMO

## 2019-04-10 DIAGNOSIS — I447 Left bundle-branch block, unspecified: Secondary | ICD-10-CM | POA: Diagnosis present

## 2019-04-10 DIAGNOSIS — I10 Essential (primary) hypertension: Secondary | ICD-10-CM | POA: Diagnosis present

## 2019-04-10 DIAGNOSIS — R079 Chest pain, unspecified: Secondary | ICD-10-CM | POA: Diagnosis present

## 2019-04-10 DIAGNOSIS — R0602 Shortness of breath: Secondary | ICD-10-CM | POA: Diagnosis not present

## 2019-04-10 DIAGNOSIS — Z7982 Long term (current) use of aspirin: Secondary | ICD-10-CM

## 2019-04-10 DIAGNOSIS — Z95820 Peripheral vascular angioplasty status with implants and grafts: Secondary | ICD-10-CM

## 2019-04-10 DIAGNOSIS — Z7951 Long term (current) use of inhaled steroids: Secondary | ICD-10-CM

## 2019-04-10 DIAGNOSIS — I5043 Acute on chronic combined systolic (congestive) and diastolic (congestive) heart failure: Secondary | ICD-10-CM | POA: Diagnosis present

## 2019-04-10 DIAGNOSIS — D72829 Elevated white blood cell count, unspecified: Secondary | ICD-10-CM | POA: Diagnosis present

## 2019-04-10 DIAGNOSIS — F17210 Nicotine dependence, cigarettes, uncomplicated: Secondary | ICD-10-CM | POA: Diagnosis present

## 2019-04-10 DIAGNOSIS — J441 Chronic obstructive pulmonary disease with (acute) exacerbation: Secondary | ICD-10-CM | POA: Diagnosis present

## 2019-04-10 DIAGNOSIS — I340 Nonrheumatic mitral (valve) insufficiency: Secondary | ICD-10-CM | POA: Diagnosis present

## 2019-04-10 DIAGNOSIS — I739 Peripheral vascular disease, unspecified: Secondary | ICD-10-CM | POA: Diagnosis present

## 2019-04-10 DIAGNOSIS — I130 Hypertensive heart and chronic kidney disease with heart failure and stage 1 through stage 4 chronic kidney disease, or unspecified chronic kidney disease: Principal | ICD-10-CM | POA: Diagnosis present

## 2019-04-10 DIAGNOSIS — N183 Chronic kidney disease, stage 3 unspecified: Secondary | ICD-10-CM | POA: Diagnosis present

## 2019-04-10 DIAGNOSIS — Z8249 Family history of ischemic heart disease and other diseases of the circulatory system: Secondary | ICD-10-CM

## 2019-04-10 DIAGNOSIS — I272 Pulmonary hypertension, unspecified: Secondary | ICD-10-CM | POA: Diagnosis present

## 2019-04-10 DIAGNOSIS — I420 Dilated cardiomyopathy: Secondary | ICD-10-CM | POA: Diagnosis present

## 2019-04-10 DIAGNOSIS — Z20822 Contact with and (suspected) exposure to covid-19: Secondary | ICD-10-CM | POA: Diagnosis present

## 2019-04-10 DIAGNOSIS — K219 Gastro-esophageal reflux disease without esophagitis: Secondary | ICD-10-CM | POA: Diagnosis present

## 2019-04-10 DIAGNOSIS — N1831 Chronic kidney disease, stage 3a: Secondary | ICD-10-CM | POA: Diagnosis present

## 2019-04-10 DIAGNOSIS — I5033 Acute on chronic diastolic (congestive) heart failure: Secondary | ICD-10-CM

## 2019-04-10 DIAGNOSIS — I248 Other forms of acute ischemic heart disease: Secondary | ICD-10-CM | POA: Diagnosis present

## 2019-04-10 DIAGNOSIS — E785 Hyperlipidemia, unspecified: Secondary | ICD-10-CM | POA: Diagnosis present

## 2019-04-10 DIAGNOSIS — Z79899 Other long term (current) drug therapy: Secondary | ICD-10-CM

## 2019-04-10 DIAGNOSIS — E538 Deficiency of other specified B group vitamins: Secondary | ICD-10-CM | POA: Diagnosis present

## 2019-04-10 LAB — CBC
HCT: 34.3 % — ABNORMAL LOW (ref 36.0–46.0)
Hemoglobin: 10.4 g/dL — ABNORMAL LOW (ref 12.0–15.0)
MCH: 24.9 pg — ABNORMAL LOW (ref 26.0–34.0)
MCHC: 30.3 g/dL (ref 30.0–36.0)
MCV: 82.3 fL (ref 80.0–100.0)
Platelets: 438 10*3/uL — ABNORMAL HIGH (ref 150–400)
RBC: 4.17 MIL/uL (ref 3.87–5.11)
RDW: 21.6 % — ABNORMAL HIGH (ref 11.5–15.5)
WBC: 12 10*3/uL — ABNORMAL HIGH (ref 4.0–10.5)
nRBC: 0 % (ref 0.0–0.2)

## 2019-04-10 LAB — BASIC METABOLIC PANEL
Anion gap: 11 (ref 5–15)
BUN: 31 mg/dL — ABNORMAL HIGH (ref 8–23)
CO2: 21 mmol/L — ABNORMAL LOW (ref 22–32)
Calcium: 9.2 mg/dL (ref 8.9–10.3)
Chloride: 105 mmol/L (ref 98–111)
Creatinine, Ser: 1.09 mg/dL — ABNORMAL HIGH (ref 0.44–1.00)
GFR calc Af Amer: >60 mL/min (ref >60)
GFR calc non Af Amer: 52 mL/min — ABNORMAL LOW (ref >60)
Glucose, Bld: 126 mg/dL — ABNORMAL HIGH (ref 70–99)
Potassium: 3.9 mmol/L (ref 3.5–5.1)
Sodium: 137 mmol/L (ref 135–145)

## 2019-04-10 MED ORDER — FUROSEMIDE 10 MG/ML IJ SOLN
40.0000 mg | Freq: Once | INTRAMUSCULAR | Status: AC
  Administered 2019-04-11: 40 mg via INTRAVENOUS
  Filled 2019-04-10: qty 4

## 2019-04-10 MED ORDER — ALBUTEROL SULFATE (2.5 MG/3ML) 0.083% IN NEBU
2.5000 mg | INHALATION_SOLUTION | Freq: Once | RESPIRATORY_TRACT | Status: AC
  Administered 2019-04-11: 2.5 mg via RESPIRATORY_TRACT
  Filled 2019-04-10: qty 3

## 2019-04-10 NOTE — ED Triage Notes (Signed)
Pt presents via EMS c/o cough and SOB x1 month. Reports seen several times but SOB/cough persistent. Denies COVID. Requesting to be admitted. Speaking in full sentences. + Tachypnea.

## 2019-04-10 NOTE — ED Triage Notes (Signed)
First nurse note: arrived by EMS from home with c/o sob and cough X1 month. HX bronchitis. Reports she has been seen at hospital multiple times for same. EMS reports 97% RA
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
## 2019-04-10 NOTE — ED Notes (Signed)
This RN apologized for delay and explained delay to patient. Pt states "yall could do better, I done been seen here for a month with the same cough, y'all could do better". This RN apologized for patient's feelings and once again apologized for delay.

## 2019-04-11 ENCOUNTER — Emergency Department: Payer: Medicare HMO

## 2019-04-11 ENCOUNTER — Encounter: Payer: Self-pay | Admitting: Radiology

## 2019-04-11 DIAGNOSIS — F17210 Nicotine dependence, cigarettes, uncomplicated: Secondary | ICD-10-CM | POA: Diagnosis present

## 2019-04-11 DIAGNOSIS — N1831 Chronic kidney disease, stage 3a: Secondary | ICD-10-CM

## 2019-04-11 DIAGNOSIS — R0602 Shortness of breath: Secondary | ICD-10-CM | POA: Diagnosis not present

## 2019-04-11 DIAGNOSIS — I340 Nonrheumatic mitral (valve) insufficiency: Secondary | ICD-10-CM | POA: Diagnosis present

## 2019-04-11 DIAGNOSIS — I248 Other forms of acute ischemic heart disease: Secondary | ICD-10-CM | POA: Diagnosis present

## 2019-04-11 DIAGNOSIS — I130 Hypertensive heart and chronic kidney disease with heart failure and stage 1 through stage 4 chronic kidney disease, or unspecified chronic kidney disease: Secondary | ICD-10-CM | POA: Diagnosis present

## 2019-04-11 DIAGNOSIS — I10 Essential (primary) hypertension: Secondary | ICD-10-CM | POA: Diagnosis present

## 2019-04-11 DIAGNOSIS — K219 Gastro-esophageal reflux disease without esophagitis: Secondary | ICD-10-CM | POA: Diagnosis present

## 2019-04-11 DIAGNOSIS — R079 Chest pain, unspecified: Secondary | ICD-10-CM | POA: Diagnosis present

## 2019-04-11 DIAGNOSIS — I5043 Acute on chronic combined systolic (congestive) and diastolic (congestive) heart failure: Secondary | ICD-10-CM | POA: Diagnosis present

## 2019-04-11 DIAGNOSIS — I420 Dilated cardiomyopathy: Secondary | ICD-10-CM | POA: Diagnosis present

## 2019-04-11 DIAGNOSIS — Z7951 Long term (current) use of inhaled steroids: Secondary | ICD-10-CM | POA: Diagnosis not present

## 2019-04-11 DIAGNOSIS — Z79899 Other long term (current) drug therapy: Secondary | ICD-10-CM | POA: Diagnosis not present

## 2019-04-11 DIAGNOSIS — I272 Pulmonary hypertension, unspecified: Secondary | ICD-10-CM | POA: Diagnosis present

## 2019-04-11 DIAGNOSIS — I5033 Acute on chronic diastolic (congestive) heart failure: Secondary | ICD-10-CM | POA: Diagnosis not present

## 2019-04-11 DIAGNOSIS — Z20822 Contact with and (suspected) exposure to covid-19: Secondary | ICD-10-CM | POA: Diagnosis present

## 2019-04-11 DIAGNOSIS — J441 Chronic obstructive pulmonary disease with (acute) exacerbation: Secondary | ICD-10-CM | POA: Diagnosis present

## 2019-04-11 DIAGNOSIS — I447 Left bundle-branch block, unspecified: Secondary | ICD-10-CM | POA: Diagnosis present

## 2019-04-11 DIAGNOSIS — Z95820 Peripheral vascular angioplasty status with implants and grafts: Secondary | ICD-10-CM | POA: Diagnosis not present

## 2019-04-11 DIAGNOSIS — D72829 Elevated white blood cell count, unspecified: Secondary | ICD-10-CM | POA: Diagnosis present

## 2019-04-11 DIAGNOSIS — E538 Deficiency of other specified B group vitamins: Secondary | ICD-10-CM | POA: Diagnosis present

## 2019-04-11 DIAGNOSIS — E785 Hyperlipidemia, unspecified: Secondary | ICD-10-CM | POA: Diagnosis present

## 2019-04-11 DIAGNOSIS — I739 Peripheral vascular disease, unspecified: Secondary | ICD-10-CM | POA: Diagnosis present

## 2019-04-11 DIAGNOSIS — Z8249 Family history of ischemic heart disease and other diseases of the circulatory system: Secondary | ICD-10-CM | POA: Diagnosis not present

## 2019-04-11 DIAGNOSIS — Z7982 Long term (current) use of aspirin: Secondary | ICD-10-CM | POA: Diagnosis not present

## 2019-04-11 LAB — RESPIRATORY PANEL BY RT PCR (FLU A&B, COVID)
Influenza A by PCR: NEGATIVE
Influenza B by PCR: NEGATIVE
SARS Coronavirus 2 by RT PCR: NEGATIVE

## 2019-04-11 LAB — TROPONIN I (HIGH SENSITIVITY)
Troponin I (High Sensitivity): 24 ng/L — ABNORMAL HIGH (ref <18)
Troponin I (High Sensitivity): 29 ng/L — ABNORMAL HIGH (ref <18)

## 2019-04-11 LAB — BRAIN NATRIURETIC PEPTIDE: B Natriuretic Peptide: 1319 pg/mL — ABNORMAL HIGH (ref 0.0–100.0)

## 2019-04-11 LAB — FIBRIN DERIVATIVES D-DIMER (ARMC ONLY): Fibrin derivatives D-dimer (ARMC): 1270.21 ng/mL (FEU) — ABNORMAL HIGH (ref 0.00–499.00)

## 2019-04-11 MED ORDER — SODIUM CHLORIDE 0.9 % IV SOLN: 250.0000 mL | INTRAVENOUS | Status: DC | PRN

## 2019-04-11 MED ORDER — ATORVASTATIN CALCIUM 80 MG PO TABS
80.0000 mg | ORAL_TABLET | Freq: Every day | ORAL | Status: DC
  Administered 2019-04-11 – 2019-04-16 (×6): 80 mg via ORAL
  Filled 2019-04-11 (×6): qty 1

## 2019-04-11 MED ORDER — ALBUTEROL SULFATE (2.5 MG/3ML) 0.083% IN NEBU: 3.0000 mL | INHALATION_SOLUTION | RESPIRATORY_TRACT | Status: DC | PRN

## 2019-04-11 MED ORDER — MOMETASONE FURO-FORMOTEROL FUM 200-5 MCG/ACT IN AERO
2.0000 | INHALATION_SPRAY | Freq: Two times a day (BID) | RESPIRATORY_TRACT | Status: DC
  Administered 2019-04-11 – 2019-04-16 (×10): 2 via RESPIRATORY_TRACT
  Filled 2019-04-11 (×2): qty 8.8

## 2019-04-11 MED ORDER — CARVEDILOL 12.5 MG PO TABS
12.5000 mg | ORAL_TABLET | Freq: Two times a day (BID) | ORAL | Status: DC
  Administered 2019-04-11 – 2019-04-16 (×10): 12.5 mg via ORAL
  Filled 2019-04-11 (×10): qty 1

## 2019-04-11 MED ORDER — FUROSEMIDE 10 MG/ML IJ SOLN
40.0000 mg | Freq: Two times a day (BID) | INTRAMUSCULAR | Status: DC
  Administered 2019-04-11 – 2019-04-16 (×11): 40 mg via INTRAVENOUS
  Filled 2019-04-11 (×11): qty 4

## 2019-04-11 MED ORDER — ACETAMINOPHEN 325 MG PO TABS
650.0000 mg | ORAL_TABLET | ORAL | Status: DC | PRN
  Administered 2019-04-15: 650 mg via ORAL
  Filled 2019-04-11: qty 2

## 2019-04-11 MED ORDER — IPRATROPIUM BROMIDE 0.02 % IN SOLN
3.0000 mL | Freq: Four times a day (QID) | RESPIRATORY_TRACT | Status: DC
  Administered 2019-04-11 – 2019-04-15 (×12): 0.5 mg via RESPIRATORY_TRACT
  Filled 2019-04-11 (×15): qty 5

## 2019-04-11 MED ORDER — ASPIRIN EC 81 MG PO TBEC
81.0000 mg | DELAYED_RELEASE_TABLET | Freq: Every day | ORAL | Status: DC
  Administered 2019-04-11 – 2019-04-16 (×6): 81 mg via ORAL
  Filled 2019-04-11 (×6): qty 1

## 2019-04-11 MED ORDER — SODIUM CHLORIDE 0.9% FLUSH: 3.0000 mL | INTRAVENOUS | Status: DC | PRN

## 2019-04-11 MED ORDER — FLUTICASONE PROPIONATE 50 MCG/ACT NA SUSP
1.0000 | Freq: Every day | NASAL | Status: DC | PRN
  Administered 2019-04-12: 08:00:00 1 via NASAL
  Filled 2019-04-11 (×2): qty 16

## 2019-04-11 MED ORDER — VITAMIN B-12 1000 MCG PO TABS
500.0000 ug | ORAL_TABLET | Freq: Every day | ORAL | Status: DC
  Administered 2019-04-11 – 2019-04-16 (×6): 500 ug via ORAL
  Filled 2019-04-11 (×6): qty 1

## 2019-04-11 MED ORDER — UMECLIDINIUM-VILANTEROL 62.5-25 MCG/INH IN AEPB
1.0000 | INHALATION_SPRAY | Freq: Every day | RESPIRATORY_TRACT | Status: DC | PRN
  Administered 2019-04-13 – 2019-04-16 (×4): 1 via RESPIRATORY_TRACT
  Filled 2019-04-11 (×2): qty 14

## 2019-04-11 MED ORDER — ENOXAPARIN SODIUM 40 MG/0.4ML ~~LOC~~ SOLN
40.0000 mg | SUBCUTANEOUS | Status: DC
  Administered 2019-04-11 – 2019-04-15 (×5): 40 mg via SUBCUTANEOUS
  Filled 2019-04-11 (×5): qty 0.4

## 2019-04-11 MED ORDER — PANTOPRAZOLE SODIUM 40 MG PO TBEC
40.0000 mg | DELAYED_RELEASE_TABLET | Freq: Every day | ORAL | Status: DC
  Administered 2019-04-11 – 2019-04-16 (×6): 40 mg via ORAL
  Filled 2019-04-11 (×6): qty 1

## 2019-04-11 MED ORDER — IOHEXOL 350 MG/ML SOLN
75.0000 mL | Freq: Once | INTRAVENOUS | Status: AC | PRN
  Administered 2019-04-11: 75 mL via INTRAVENOUS

## 2019-04-11 MED ORDER — SODIUM CHLORIDE 0.9% FLUSH
3.0000 mL | Freq: Two times a day (BID) | INTRAVENOUS | Status: DC
  Administered 2019-04-11 – 2019-04-16 (×10): 3 mL via INTRAVENOUS

## 2019-04-11 MED ORDER — DM-GUAIFENESIN ER 30-600 MG PO TB12
1.0000 | ORAL_TABLET | Freq: Two times a day (BID) | ORAL | Status: DC
  Administered 2019-04-11 – 2019-04-16 (×11): 1 via ORAL
  Filled 2019-04-11 (×15): qty 1

## 2019-04-11 MED ORDER — SACUBITRIL-VALSARTAN 24-26 MG PO TABS
1.0000 | ORAL_TABLET | Freq: Every day | ORAL | Status: DC
  Administered 2019-04-11 – 2019-04-14 (×4): 1 via ORAL
  Filled 2019-04-11 (×4): qty 1

## 2019-04-11 MED ORDER — IPRATROPIUM BROMIDE HFA 17 MCG/ACT IN AERS: 2.0000 | INHALATION_SPRAY | Freq: Four times a day (QID) | RESPIRATORY_TRACT | Status: DC

## 2019-04-11 MED ORDER — HYDRALAZINE HCL 25 MG PO TABS: 25.0000 mg | ORAL_TABLET | Freq: Three times a day (TID) | ORAL | Status: DC | PRN

## 2019-04-11 NOTE — H&P (Signed)
History and Physical    LYNEE ROSENBACH PTW:[id number] DOB: [birth year] DOA: 04/10/2019  Referring MD/NP/PA:   PCP: Courtney Paris, NP   Patient coming from:  The patient is coming from home.  At baseline, pt is independent for most of ADL.        Chief Complaint: Shortness breath, chest pain  HPI: Barbara Oliver is a 69 y.o. female with medical history significant of COPD, hyperlipidemia, GERD, former smoker, mitral valve regurgitation, left bundle blockage, dCHF, CKD stage IIIa, who presents with shortness breath and chest pain.  Patient states that she has been having dry cough and shortness of breath for more than 1 month, which has worsened in the past 10 days.  She also reports chest pain, which is located in the substernal area, moderate, pleuritic, worsening on deep breath. No tenderness in the calf areas.  No fever or chills.  Patient states that her Lasix dose was increased from 40 mg daily to twice a day by her doctor without significant help.  Patient does not have nausea, vomiting, diarrhea, abdominal pain, symptoms of UTI or unilateral weakness.  ED Course: pt was found to have BNP 1319, troponin 29 -->24, negative RVP for Covid test, stable renal function, temperature normal, blood pressure 99/62, heart rate 36, 92, oxygen saturation 75% on RA -->97% on 2 L nasal cannula oxygen.  CT angiogram is negative for PE, but showed mild interstitial pulmonary edema and pulmonary hypertension.  Patient is admitted to telemetry bed as inpatient.  Review of Systems:   General: no fevers, chills, no body weight gain, has fatigue HEENT: no blurry vision, hearing changes or sore throat Respiratory: has dyspnea, coughing, no wheezing CV: has chest pain, no palpitations GI: no nausea, vomiting, abdominal pain, diarrhea, constipation GU: no dysuria, burning on urination, increased urinary frequency, hematuria  Ext: no leg edema Neuro: no unilateral weakness, numbness, or tingling, no vision  change or hearing loss Skin: no rash, no skin tear. MSK: No muscle spasm, no deformity, no limitation of range of movement in spin Heme: No easy bruising.  Travel history: No recent long distant travel.  Allergy: No Known Allergies  Past Medical History:  Diagnosis Date  . Abdominal pain, epigastric 10/06/2015  . Acid reflux   . Acute on chronic systolic (congestive) heart failure (HCC) 03/19/2017  . Acute renal insufficiency 10/06/2015  . Acute respiratory failure with hypoxia (HCC) 04/26/2018  . Chronic systolic CHF (congestive heart failure) (HCC)   . CKD (chronic kidney disease), stage III   . 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 COPD with acute bronchitis (HCC) 04/25/2018  . Dyspnea   . Elevated transaminase level 10/06/2015  . History of noncompliance with medical treatment, presenting hazards to health   . Hyperglycemia 10/06/2015  . LBBB (left bundle branch block)   . Lung nodule    a. seen on prior CT, f/u due 11/2017.  . Mitral regurgitation   . NICM (nonischemic cardiomyopathy) (HCC)    a. prior hx of this, EF 20-25% in 11/2016. b. EF 30-35% by cath 03/2017 with normal coronaries.  . Protein-calorie malnutrition, severe 03/07/2016  . Respiratory distress 04/25/2018  . Severe mitral regurgitation 10/06/2015  . Tobacco abuse     Past Surgical History:  Procedure Laterality Date  . CARDIAC CATHETERIZATION    . COLONOSCOPY WITH PROPOFOL N/A 01/26/2019   Procedure: COLONOSCOPY WITH PROPOFOL;  Surgeon: Midge Minium, MD;  Location: [facility] ENDOSCOPY;  Service: Endoscopy;  Laterality: N/A;  . ENDARTERECTOMY Left  09/29/2018   Procedure: ENDARTERECTOMY CAROTID ARTERY LEFT;  Surgeon: Chuck Hint, MD;  Location: [facility] OR;  Service: Vascular;  Laterality: Left;  . ESOPHAGOGASTRODUODENOSCOPY N/A 01/26/2019   Procedure: ESOPHAGOGASTRODUODENOSCOPY (EGD);  Surgeon: Midge Minium, MD;  Location: [facility] ENDOSCOPY;  Service: Endoscopy;  Laterality: N/A;  . HERNIA REPAIR    .  PATCH ANGIOPLASTY Left 09/29/2018   Procedure: Patch Angioplasty Left Carotid Artery using Xenosure Biologic Patch;  Surgeon: Chuck Hint, MD;  Location: [facility] OR;  Service: Vascular;  Laterality: Left;  . RIGHT/LEFT HEART CATH AND CORONARY ANGIOGRAPHY N/A 03/21/2017   Procedure: RIGHT/LEFT HEART CATH AND CORONARY ANGIOGRAPHY;  Surgeon: Dolores Patty, MD;  Location: [facility] INVASIVE CV LAB;  Service: Cardiovascular;  Laterality: N/A;    Social History:  reports that she has been smoking cigarettes. She has a 25.00 pack-year smoking history. She has never used smokeless tobacco. She reports that she does not drink alcohol or use drugs.  Family History:  Family History  Problem Relation Age of Onset  . Hypertension Mother   . Heart attack Father      Prior to Admission medications   Medication Sig Start Date End Date Taking? Authorizing Provider  acetaminophen (TYLENOL) 500 MG tablet Take 1,000 mg by mouth daily as needed for mild pain.   Yes [provider]  albuterol (PROVENTIL HFA;VENTOLIN HFA) 108 (90 Base) MCG/ACT inhaler Inhale 1-2 puffs into the lungs every 6 (six) hours as needed for wheezing or shortness of breath. 01/28/17  Yes Caccavale, Sophia, PA-C  albuterol (PROVENTIL) (2.5 MG/3ML) 0.083% nebulizer solution Take 2.5 mg by nebulization every 6 (six) hours as needed for wheezing or shortness of breath.  07/15/18  Yes [provider]  aspirin EC 81 MG EC tablet Take 1 tablet (81 mg total) by mouth daily. 03/12/16  Yes Gouru, Deanna Artis, MD  atorvastatin (LIPITOR) 80 MG tablet Take 1 tablet (80 mg total) by mouth daily. 12/22/18 04/11/19 Yes Lewayne Bunting, MD  budesonide-formoterol [facility]) 160-4.5 MCG/ACT inhaler Inhale 2 puffs into the lungs 2 (two) times daily as needed (SOB).   Yes [provider]  carvedilol (COREG) 12.5 MG tablet Take 12.5 mg by mouth 2 (two) times daily with a meal.   Yes [provider]  ENTRESTO 24-26 MG Take 1 tablet  by mouth daily. 07/27/18  Yes [provider]  fluticasone (FLONASE) 50 MCG/ACT nasal spray Place 1 spray into both nostrils daily. Patient taking differently: Place 1 spray into both nostrils daily as needed for allergies.  01/28/17  Yes Caccavale, Sophia, PA-C  furosemide (LASIX) 40 MG tablet TAKE 1 TABLET BY MOUTH DAILY. Patient taking differently: Take 40 mg by mouth 2 (two) times daily.  09/22/17  Yes Jodelle Gross, NP  pantoprazole (PROTONIX) 40 MG tablet Take 1 tablet (40 mg total) by mouth daily. 03/11/16  Yes Gouru, Deanna Artis, MD  spironolactone (ALDACTONE) 25 MG tablet TAKE 1 TABLET BY MOUTH DAILY. Patient taking differently: Take 25 mg by mouth daily.  07/27/18  Yes Lewayne Bunting, MD  umeclidinium-vilanterol (ANORO ELLIPTA) 62.5-25 MCG/INH AEPB Inhale 1 puff into the lungs daily as needed (SOB).   Yes [provider]  vitamin B-12 (CYANOCOBALAMIN) 500 MCG tablet Take 1 tablet (500 mcg total) by mouth daily. 01/28/19  Yes Pennie Banter, DO  Vitamin D, Ergocalciferol, (DRISDOL) 1.25 MG (50000 UT) CAPS capsule Take 50,000 Units by mouth every Saturday.    Yes [provider]  benzonatate (TESSALON PERLES) 100 MG capsule  Take 1 capsule (100 mg total) by mouth 3 (three) times daily as needed for cough. Patient not taking: Reported on 04/11/2019 03/31/19 03/30/20  Miguel Aschoff., MD  budesonide-formoterol [facility]) 160-4.5 MCG/ACT inhaler Inhale 2 puffs into the lungs 2 (two) times daily. 03/31/19 04/30/19  Miguel Aschoff., MD    Physical Exam: Vitals:   04/11/19 0845 04/11/19 0900 04/11/19 0921 04/11/19 1016  BP: (!) 115/55 (!) 105/91  104/63  Pulse:    94  Resp: (!) 22 17    Temp:    98.6 F (37 C)  TempSrc:    Oral  SpO2:   100% 99%  Weight:    85.2 kg  Height:    6\' 1"  (1.854 m)   General: Not in acute distress HEENT:       Eyes: PERRL, EOMI, no scleral icterus.       ENT: No discharge from the ears and nose, no pharynx injection, no tonsillar  enlargement.        Neck: postive JVD, no bruit, no mass felt. Heme: No neck lymph node enlargement. Cardiac: S1/S2, RRR, No murmurs, No gallops or rubs. Respiratory: Patient has mild wheezing bilaterally GI: Soft, nondistended, nontender, no rebound pain, no organomegaly, BS present. GU: No hematuria Ext: No pitting leg edema bilaterally. 2+DP/PT pulse bilaterally. Musculoskeletal: No joint deformities, No joint redness or warmth, no limitation of ROM in spin. Skin: No rashes.  Neuro: Alert, oriented X3, cranial nerves II-XII grossly intact, moves all extremities normally. Psych: Patient is not psychotic, no suicidal or hemocidal ideation.  Labs on Admission: I have personally reviewed following labs and imaging studies  CBC: Recent Labs  Lab 04/10/19 1622  WBC 12.0*  HGB 10.4*  HCT 34.3*  MCV 82.3  PLT 438*   Basic Metabolic Panel: Recent Labs  Lab 04/10/19 1622  NA 137  K 3.9  CL 105  CO2 21*  GLUCOSE 126*  BUN 31*  CREATININE 1.09*  CALCIUM 9.2   GFR: Estimated Creatinine Clearance: 59.6 mL/min (A) (by C-G formula based on SCr of 1.09 mg/dL (H)). Liver Function Tests: No results for input(s): AST, ALT, ALKPHOS, BILITOT, PROT, ALBUMIN in the last 168 hours. No results for input(s): LIPASE, AMYLASE in the last 168 hours. No results for input(s): AMMONIA in the last 168 hours. Coagulation Profile: No results for input(s): INR, PROTIME in the last 168 hours. Cardiac Enzymes: No results for input(s): CKTOTAL, CKMB, CKMBINDEX, TROPONINI in the last 168 hours. BNP (last 3 results) No results for input(s): PROBNP in the last 8760 hours. HbA1C: No results for input(s): HGBA1C in the last 72 hours. CBG: No results for input(s): GLUCAP in the last 168 hours. Lipid Profile: No results for input(s): CHOL, HDL, LDLCALC, TRIG, CHOLHDL, LDLDIRECT in the last 72 hours. Thyroid Function Tests: No results for input(s): TSH, T4TOTAL, FREET4, T3FREE, THYROIDAB in the last 72  hours. Anemia Panel: No results for input(s): VITAMINB12, FOLATE, FERRITIN, TIBC, IRON, RETICCTPCT in the last 72 hours. Urine analysis:    Component Value Date/Time   COLORURINE YELLOW 09/24/2018 1535   APPEARANCEUR CLEAR 09/24/2018 1535   APPEARANCEUR Clear 09/30/2013 1021   LABSPEC 1.012 09/24/2018 1535   LABSPEC 1.006 09/30/2013 1021   PHURINE 5.0 09/24/2018 1535   GLUCOSEU NEGATIVE 09/24/2018 1535   GLUCOSEU Negative 09/30/2013 1021   HGBUR SMALL (A) 09/24/2018 1535   BILIRUBINUR NEGATIVE 09/24/2018 1535   BILIRUBINUR Negative 09/30/2013 1021   KETONESUR NEGATIVE 09/24/2018 1535   PROTEINUR NEGATIVE 09/24/2018 1535  NITRITE NEGATIVE 09/24/2018 1535   LEUKOCYTESUR TRACE (A) 09/24/2018 1535   LEUKOCYTESUR Negative 09/30/2013 1021   Sepsis Labs: @LABRCNTIP (procalcitonin:4,lacticidven:4) ) Recent Results (from the past 240 hour(s))  Respiratory Panel by RT PCR (Flu A&B, Covid) - Nasopharyngeal Swab     Status: None   Collection Time: 04/11/19  3:12 AM   Specimen: Nasopharyngeal Swab  Result Value Ref Range Status   SARS Coronavirus 2 by RT PCR NEGATIVE NEGATIVE Final    Comment: (NOTE) SARS-CoV-2 target nucleic acids are NOT DETECTED. The SARS-CoV-2 RNA is generally detectable in upper respiratoy specimens during the acute phase of infection. The lowest concentration of SARS-CoV-2 viral copies this assay can detect is 131 copies/mL. A negative result does not preclude SARS-Cov-2 infection and should not be used as the sole basis for treatment or other patient management decisions. A negative result may occur with  improper specimen collection/handling, submission of specimen other than nasopharyngeal swab, presence of viral mutation(s) within the areas targeted by this assay, and inadequate number of viral copies (<131 copies/mL). A negative result must be combined with clinical observations, patient history, and epidemiological information. The expected result is  Negative. Fact Sheet for Patients:  04/13/19 Fact Sheet for Healthcare Providers:  https://www.moore.com/ This test is not yet ap proved or cleared by the https://www.young.biz/ FDA and  has been authorized for detection and/or diagnosis of SARS-CoV-2 by FDA under an Emergency Use Authorization (EUA). This EUA will remain  in effect (meaning this test can be used) for the duration of the COVID-19 declaration under Section 564(b)(1) of the Act, 21 U.S.C. section 360bbb-3(b)(1), unless the authorization is terminated or revoked sooner.    Influenza A by PCR NEGATIVE NEGATIVE Final   Influenza B by PCR NEGATIVE NEGATIVE Final    Comment: (NOTE) The Xpert Xpress SARS-CoV-2/FLU/RSV assay is intended as an aid in  the diagnosis of influenza from Nasopharyngeal swab specimens and  should not be used as a sole basis for treatment. Nasal washings and  aspirates are unacceptable for Xpert Xpress SARS-CoV-2/FLU/RSV  testing. Fact Sheet for Patients: [place] Fact Sheet for Healthcare Providers: https://www.moore.com/ This test is not yet approved or cleared by the https://www.young.biz/ FDA and  has been authorized for detection and/or diagnosis of SARS-CoV-2 by  FDA under an Emergency Use Authorization (EUA). This EUA will remain  in effect (meaning this test can be used) for the duration of the  Covid-19 declaration under Section 564(b)(1) of the Act, 21  U.S.C. section 360bbb-3(b)(1), unless the authorization is  terminated or revoked. Performed at [facility], [address] [place], [place]      Radiological Exams on Admission: DG Chest 2 View  Result Date: 04/10/2019 CLINICAL DATA:  Cough.  Shortness of breath. EXAM: CHEST - 2 VIEW COMPARISON:  March 31, 2019 FINDINGS: Cardiomegaly. Increased interstitial markings are stable to mildly worsened since March 31, 2019. No  pneumothorax. No nodules or masses. No focal infiltrates. IMPRESSION: 1. Findings are most consistent with cardiomegaly and mild edema. Interstitial opacities can also be seen with atypical pneumonia is. Recommend clinical correlation. Electronically Signed   By: April 02, 2019 III M.D   On: 04/10/2019 18:18   CT Angio Chest PE W/Cm &/Or Wo Cm  Result Date: 04/11/2019 CLINICAL DATA:  Acute onset shortness of breath, elevated D-dimer EXAM: CT ANGIOGRAPHY CHEST WITH CONTRAST TECHNIQUE: Multidetector CT imaging of the chest was performed using the standard protocol during bolus administration of intravenous contrast. Multiplanar CT image reconstructions  and MIPs were obtained to evaluate the vascular anatomy. CONTRAST:  75mL OMNIPAQUE IOHEXOL 350 MG/ML SOLN COMPARISON:  CT 01/22/2019 FINDINGS: Cardiovascular: Satisfactory opacification the pulmonary arteries to the segmental level. No pulmonary artery filling defects are identified. Central pulmonary arteries borderline enlarged but similar to comparison, possibly reflecting chronic pulmonary artery hypertension. Cardiomegaly with four-chamber enlargement. Coronary artery atherosclerosis is present. No pericardial effusion. Aorta is poorly opacified. Thoracic aorta is upper limits normal caliber. Atherosclerotic plaque within the aorta. Normal branching of the aortic arch. Slight tortuosity of the brachiocephalic vessels without gross abnormality. Mediastinum/Nodes: No pathologically enlarged mediastinal, hilar or axillary adenopathy. No mediastinal fluid or hemorrhage. Thyroid gland and thoracic inlet are unremarkable. No acute abnormality of the trachea or esophagus. Lungs/Pleura: Stable 10 mm subpleural nodule in the right middle lobe. No focal consolidation. Mild interlobular septal thickening is noted in the apices and bases. There is some slight vascular cuffing and vascular redistribution. There is a background of fine reticular opacities in the lung  periphery some of which demonstrate subpleural sparing in the left upper lobe. Upper Abdomen: No acute abnormalities present in the visualized portions of the upper abdomen. Musculoskeletal: Multilevel degenerative changes are present in the imaged portions of the spine. No acute osseous abnormality or suspicious osseous lesion. Review of the MIP images confirms the above findings. IMPRESSION: 1. No evidence of acute pulmonary artery filling defect to suggest pulmonary embolism. 2. Mild interlobular septal thickening and vascular redistribution suggestive of mild interstitial edema. 3. Cardiomegaly with four-chamber enlargement. Coronary artery atherosclerosis. 4. Central pulmonary arterial enlargement likely reflecting pulmonary artery hypertension. 5.  Aortic Atherosclerosis (ICD10-I70.0). 6. Background of fine reticular opacities in the lung periphery including some subpleural sparing in the left upper lobe. Findings may suggest interstitial lung disease. Recommend further evaluation with nonemergent outpatient HRCT. 7. Stable 10 mm subpleural nodule right middle lobe. Unchanged since 2018, likely benign. Electronically Signed   By: Kreg Shropshire M.D.   On: 04/11/2019 02:22     EKG: Independently reviewed.  Sinus rhythm, old left bundle blockade, QTC 528, LAD, LAE, poor R wave progression   Assessment/Plan Principal Problem:   Acute on chronic diastolic CHF (congestive heart failure) (HCC) Active Problems:   CKD (chronic kidney disease), stage III   COPD exacerbation (HCC)   Acid reflux   HTN (hypertension)   HLD (hyperlipidemia)   Leukocytosis   Chest pain   Acute on chronic diastolic CHF: Patient does not have leg edema physical examination, but has positive JVD, elevated BNP 1319, CT angiogram is negative for PE, showed mild interstitial pulmonary edema and pulmonary hypertension, consistent service CHF exacerbation.  -will admit to tele bed as inpt. -Lasix 40 mg bid by IV -trend  trop -2d echo -Daily weights -strict I/O's -Low salt diet -Fluid restriction  CKD (chronic kidney disease), stage IIIa: stable. Cre 1.09, BUN 31 -f/u by BMP  COPD exacerbation (HCC): Patient has wheeze on auscultation. -Bronchodilators, as needed Mucinex  Acid reflux: -PPI  HTN:  -Continue home medications: Coreg, Entresto -hydralazine prn  HLD (hyperlipidemia) -Lipitor  Leukocytosis: WBC 12.0, no fever, likely reactive -f/u by CBC  Chest pain: Troponin is slightly elevated at 29 --> 24.  CT angiogram is negative for PE.  Most likely due to demand ischemia secondary to CHF exacerbation. -Check A1c, FLP -on aspirin, Lipitor, Coreg - follow-up 2D echo   Inpatient status:  # Patient requires inpatient status due to high intensity of service, high risk for further deterioration and high frequency of  surveillance required.  I certify that at the point of admission it is my clinical judgment that the patient will require inpatient hospital care spanning beyond 2 midnights from the point of admission.  . This patient has multiple chronic comorbidities including COPD, hyperlipidemia, GERD, former smoker, mitral valve regurgitation, left bundle blockage, dCHF, CKD stage IIIa . Now patient has presenting with CHF exacerbation, chest pain, COPD exacerbation . The worrisome physical exam findings include positive JVD . The initial radiographic and laboratory data are worrisome because of elevated BNP 1319, positive troponin, leukocytosis.  CT angiogram showed pulmonary edema and pulmonary hypertension . Current medical needs: please see my assessment and plan . Predictability of an adverse outcome (risk): Patient has multiple comorbidities as listed above. Now presents with CHF exacerbation, chest pain, COPD exacerbation. Patient's presentation is highly complicated.  Patient is at high risk of deteriorating.  Will need to be treated in hospital for at least 2 days.             DVT ppx: SQ Lovenox Code Status: Full code Family Communication: None at bed side.    Disposition Plan:  Anticipate discharge back to previous home environment Consults called:  none Admission status:  Tele bed  as inpt    Date of Service 04/11/2019    [facility]   If 7PM-7AM, please contact night-coverage [email or web address] Password [facility] 04/11/2019, 12:43 PM

## 2019-04-11 NOTE — Social Work (Signed)
TOC consult received for SNF.    Larwance Rote, MSW, LCSW  [phone] 8am-6pm

## 2019-04-11 NOTE — ED Notes (Signed)
Breakfast tray provided. 

## 2019-04-11 NOTE — ED Notes (Signed)
Report called. Pending transport.

## 2019-04-12 ENCOUNTER — Inpatient Hospital Stay
Admit: 2019-04-12 | Discharge: 2019-04-12 | Disposition: A | Discharge status: Home / Self Care | Payer: Medicare HMO | Attending: Internal Medicine | Admitting: Internal Medicine

## 2019-04-12 LAB — ECHOCARDIOGRAM COMPLETE
Height: 73 in
Weight: 2993.6 oz

## 2019-04-12 LAB — BASIC METABOLIC PANEL
Anion gap: 12 (ref 5–15)
BUN: 21 mg/dL (ref 8–23)
CO2: 27 mmol/L (ref 22–32)
Calcium: 9 mg/dL (ref 8.9–10.3)
Chloride: 98 mmol/L (ref 98–111)
Creatinine, Ser: 0.96 mg/dL (ref 0.44–1.00)
GFR calc Af Amer: >60 mL/min (ref >60)
GFR calc non Af Amer: >60 mL/min (ref >60)
Glucose, Bld: 104 mg/dL — ABNORMAL HIGH (ref 70–99)
Potassium: 3.3 mmol/L — ABNORMAL LOW (ref 3.5–5.1)
Sodium: 137 mmol/L (ref 135–145)

## 2019-04-12 LAB — LIPID PANEL
Cholesterol: 166 mg/dL (ref 0–200)
HDL: 32 mg/dL — ABNORMAL LOW (ref >40)
LDL Cholesterol: 117 mg/dL — ABNORMAL HIGH (ref 0–99)
Total CHOL/HDL Ratio: 5.2 RATIO
Triglycerides: 83 mg/dL (ref <150)
VLDL: 17 mg/dL (ref 0–40)

## 2019-04-12 LAB — HEMOGLOBIN A1C
Hgb A1c MFr Bld: 5.4 % (ref 4.8–5.6)
Mean Plasma Glucose: 108.28 mg/dL

## 2019-04-12 LAB — MAGNESIUM: Magnesium: 1.9 mg/dL (ref 1.7–2.4)

## 2019-04-12 MED ORDER — MAGNESIUM SULFATE IN D5W 1-5 GM/100ML-% IV SOLN
1.0000 g | Freq: Once | INTRAVENOUS | Status: AC
  Administered 2019-04-12: 11:00:00 1 g via INTRAVENOUS
  Filled 2019-04-12: qty 100

## 2019-04-12 MED ORDER — POTASSIUM CHLORIDE CRYS ER 20 MEQ PO TBCR
40.0000 meq | EXTENDED_RELEASE_TABLET | Freq: Once | ORAL | Status: AC
  Administered 2019-04-12: 09:00:00 40 meq via ORAL
  Filled 2019-04-12: qty 2
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
## 2019-04-12 NOTE — Progress Notes (Signed)
*  PRELIMINARY RESULTS* Echocardiogram 2D Echocardiogram has been performed.  Barbara Oliver 04/12/2019, 10:28 AM

## 2019-04-12 NOTE — Progress Notes (Signed)
PROGRESS NOTE    Barbara Oliver  JEH:[id number] DOB: [birth year] DOA: 04/10/2019 PCP: Courtney Paris, NP    Brief Narrative:  HPI: Barbara Oliver is a 69 y.o. female with medical history significant of COPD, hyperlipidemia, GERD, former smoker, mitral valve regurgitation, left bundle blockage, dCHF, CKD stage IIIa, who presents with shortness breath and chest pain.  Patient states that she has been having dry cough and shortness of breath for more than 1 month, which has worsened in the past 10 days.  She also reports chest pain, which is located in the substernal area, moderate, pleuritic, worsening on deep breath. No tenderness in the calf areas.  No fever or chills.  Patient states that her Lasix dose was increased from 40 mg daily to twice a day by her doctor without significant help.  Patient does not have nausea, vomiting, diarrhea, abdominal pain, symptoms of UTI or unilateral weakness.  2/1: Patient seen and examined.  Symptomatically improved.  Diuresing appropriately.  Net -1.5 L since admission.  Feeling well.  Echo in progress.   Assessment & Plan:   Principal Problem:   Acute on chronic diastolic CHF (congestive heart failure) (HCC) Active Problems:   CKD (chronic kidney disease), stage III   COPD exacerbation (HCC)   Acid reflux   HTN (hypertension)   HLD (hyperlipidemia)   Leukocytosis   Chest pain  Acute on chronic diastolic CHF:  Patient does not have leg edema physical examination, but has positive JVD, elevated BNP 1319, CT angiogram is negative for PE, showed mild interstitial pulmonary edema and pulmonary hypertension, consistent with CHF exacerbation.  Plan: -Lasix 40 mg bid by IV -trend trop -2d echo -Daily weights -strict I/O's -Low salt diet -Fluid restriction -Target net -1 to 1.5 L/day  CKD (chronic kidney disease), stage IIIa:  stable. Cre 1.09, BUN 31 -f/u by BMP  COPD exacerbation (HCC):  Patient has wheeze on  auscultation. -Bronchodilators, as needed Mucinex  Acid reflux: -PPI  HTN:  -Continue home medications: Coreg, Entresto -hydralazine prn  HLD (hyperlipidemia) -Lipitor  Leukocytosis: WBC 12.0, no fever, likely reactive -f/u by CBC  Chest pain:  Troponin is slightly elevated at 29 --> 24.   CT angiogram is negative for PE.   Most likely due to demand ischemia secondary to CHF exacerbation. -on aspirin, Lipitor, Coreg - follow-up 2D echo   DVT prophylaxis: Lovenox Code Status: Full Family Communication: None Disposition Plan: Home, when euvolemia achieved, anticipate 24 to 48 hours  Consultants:   None  Procedures:   2D echocardiogram  Antimicrobials:   None   Subjective: Seen and examined No acute status changes over interval No new complaints  Objective: Vitals:   04/12/19 0824 04/12/19 1122 04/12/19 1508 04/12/19 1509  BP: 117/75 95/66 99/60  (!) 109/58  Pulse: 86 81 86 86  Resp:  18 17 18   Temp:  97.8 F (36.6 C) 98.4 F (36.9 C)   TempSrc:  Oral Oral   SpO2: 100% 98% 95% 100%  Weight:      Height:        Intake/Output Summary (Last 24 hours) at 04/12/2019 1513 Last data filed at 04/12/2019 1002 Gross per 24 hour  Intake 240 ml  Output 1500 ml  Net -1260 ml   Filed Weights   04/10/19 1612 04/11/19 1016 04/12/19 0406  Weight: 83.5 kg 85.2 kg 84.9 kg    Examination:  General exam: Appears calm and comfortable  Respiratory system: Clear to auscultation. Respiratory effort normal. Cardiovascular system: S1-S2,  RRR, positive JVD, trace pedal edema  gastrointestinal system: Abdomen is nondistended, soft and nontender. No organomegaly or masses felt. Normal bowel sounds heard. Central nervous system: Alert and oriented. No focal neurological deficits. Extremities: Symmetric 5 x 5 power. Skin: No rashes, lesions or ulcers Psychiatry: Judgement and insight appear normal. Mood & affect appropriate.     Data Reviewed: I have personally  reviewed following labs and imaging studies  CBC: Recent Labs  Lab 04/10/19 1622  WBC 12.0*  HGB 10.4*  HCT 34.3*  MCV 82.3  PLT 643*   Basic Metabolic Panel: Recent Labs  Lab 04/10/19 1622 04/12/19 0608  NA 137 137  K 3.9 3.3*  CL 105 98  CO2 21* 27  GLUCOSE 126* 104*  BUN 31* 21  CREATININE 1.09* 0.96  CALCIUM 9.2 9.0  MG  --  1.9   GFR: Estimated Creatinine Clearance: 67.7 mL/min (by C-G formula based on SCr of 0.96 mg/dL). Liver Function Tests: No results for input(s): AST, ALT, ALKPHOS, BILITOT, PROT, ALBUMIN in the last 168 hours. No results for input(s): LIPASE, AMYLASE in the last 168 hours. No results for input(s): AMMONIA in the last 168 hours. Coagulation Profile: No results for input(s): INR, PROTIME in the last 168 hours. Cardiac Enzymes: No results for input(s): CKTOTAL, CKMB, CKMBINDEX, TROPONINI in the last 168 hours. BNP (last 3 results) No results for input(s): PROBNP in the last 8760 hours. HbA1C: Recent Labs    04/12/19 0608  HGBA1C 5.4   CBG: No results for input(s): GLUCAP in the last 168 hours. Lipid Profile: Recent Labs    04/12/19 0608  CHOL 166  HDL 32*  LDLCALC 117*  TRIG 83  CHOLHDL 5.2   Thyroid Function Tests: No results for input(s): TSH, T4TOTAL, FREET4, T3FREE, THYROIDAB in the last 72 hours. Anemia Panel: No results for input(s): VITAMINB12, FOLATE, FERRITIN, TIBC, IRON, RETICCTPCT in the last 72 hours. Sepsis Labs: No results for input(s): PROCALCITON, LATICACIDVEN in the last 168 hours.  Recent Results (from the past 240 hour(s))  Respiratory Panel by RT PCR (Flu A&B, Covid) - Nasopharyngeal Swab     Status: None   Collection Time: 04/11/19  3:12 AM   Specimen: Nasopharyngeal Swab  Result Value Ref Range Status   SARS Coronavirus 2 by RT PCR NEGATIVE NEGATIVE Final    Comment: (NOTE) SARS-CoV-2 target nucleic acids are NOT DETECTED. The SARS-CoV-2 RNA is generally detectable in upper respiratoy specimens  during the acute phase of infection. The lowest concentration of SARS-CoV-2 viral copies this assay can detect is 131 copies/mL. A negative result does not preclude SARS-Cov-2 infection and should not be used as the sole basis for treatment or other patient management decisions. A negative result may occur with  improper specimen collection/handling, submission of specimen other than nasopharyngeal swab, presence of viral mutation(s) within the areas targeted by this assay, and inadequate number of viral copies (<131 copies/mL). A negative result must be combined with clinical observations, patient history, and epidemiological information. The expected result is Negative. Fact Sheet for Patients:  PinkCheek.be Fact Sheet for Healthcare Providers:  GravelBags.it This test is not yet ap proved or cleared by the [place] FDA and  has been authorized for detection and/or diagnosis of SARS-CoV-2 by FDA under an Emergency Use Authorization (EUA). This EUA will remain  in effect (meaning this test can be used) for the duration of the COVID-19 declaration under Section 564(b)(1) of the Act, 21 U.S.C. section 360bbb-3(b)(1), unless the authorization is terminated  or revoked sooner.    Influenza A by PCR NEGATIVE NEGATIVE Final   Influenza B by PCR NEGATIVE NEGATIVE Final    Comment: (NOTE) The Xpert Xpress SARS-CoV-2/FLU/RSV assay is intended as an aid in  the diagnosis of influenza from Nasopharyngeal swab specimens and  should not be used as a sole basis for treatment. Nasal washings and  aspirates are unacceptable for Xpert Xpress SARS-CoV-2/FLU/RSV  testing. Fact Sheet for Patients: https://www.moore.com/ Fact Sheet for Healthcare Providers: https://www.young.biz/ This test is not yet approved or cleared by the [place] FDA and  has been authorized for detection and/or diagnosis of  SARS-CoV-2 by  FDA under an Emergency Use Authorization (EUA). This EUA will remain  in effect (meaning this test can be used) for the duration of the  Covid-19 declaration under Section 564(b)(1) of the Act, 21  U.S.C. section 360bbb-3(b)(1), unless the authorization is  terminated or revoked. Performed at [facility], [address] [place], [place] [address]          Radiology Studies: DG Chest 2 View  Result Date: 04/10/2019 CLINICAL DATA:  Cough.  Shortness of breath. EXAM: CHEST - 2 VIEW COMPARISON:  March 31, 2019 FINDINGS: Cardiomegaly. Increased interstitial markings are stable to mildly worsened since March 31, 2019. No pneumothorax. No nodules or masses. No focal infiltrates. IMPRESSION: 1. Findings are most consistent with cardiomegaly and mild edema. Interstitial opacities can also be seen with atypical pneumonia is. Recommend clinical correlation. Electronically Signed   By: Gerome Sam III M.D   On: 04/10/2019 18:18   CT Angio Chest PE W/Cm &/Or Wo Cm  Result Date: 04/11/2019 CLINICAL DATA:  Acute onset shortness of breath, elevated D-dimer EXAM: CT ANGIOGRAPHY CHEST WITH CONTRAST TECHNIQUE: Multidetector CT imaging of the chest was performed using the standard protocol during bolus administration of intravenous contrast. Multiplanar CT image reconstructions and MIPs were obtained to evaluate the vascular anatomy. CONTRAST:  13mL OMNIPAQUE IOHEXOL 350 MG/ML SOLN COMPARISON:  CT 01/22/2019 FINDINGS: Cardiovascular: Satisfactory opacification the pulmonary arteries to the segmental level. No pulmonary artery filling defects are identified. Central pulmonary arteries borderline enlarged but similar to comparison, possibly reflecting chronic pulmonary artery hypertension. Cardiomegaly with four-chamber enlargement. Coronary artery atherosclerosis is present. No pericardial effusion. Aorta is poorly opacified. Thoracic aorta is upper limits normal caliber.  Atherosclerotic plaque within the aorta. Normal branching of the aortic arch. Slight tortuosity of the brachiocephalic vessels without gross abnormality. Mediastinum/Nodes: No pathologically enlarged mediastinal, hilar or axillary adenopathy. No mediastinal fluid or hemorrhage. Thyroid gland and thoracic inlet are unremarkable. No acute abnormality of the trachea or esophagus. Lungs/Pleura: Stable 10 mm subpleural nodule in the right middle lobe. No focal consolidation. Mild interlobular septal thickening is noted in the apices and bases. There is some slight vascular cuffing and vascular redistribution. There is a background of fine reticular opacities in the lung periphery some of which demonstrate subpleural sparing in the left upper lobe. Upper Abdomen: No acute abnormalities present in the visualized portions of the upper abdomen. Musculoskeletal: Multilevel degenerative changes are present in the imaged portions of the spine. No acute osseous abnormality or suspicious osseous lesion. Review of the MIP images confirms the above findings. IMPRESSION: 1. No evidence of acute pulmonary artery filling defect to suggest pulmonary embolism. 2. Mild interlobular septal thickening and vascular redistribution suggestive of mild interstitial edema. 3. Cardiomegaly with four-chamber enlargement. Coronary artery atherosclerosis. 4. Central pulmonary arterial enlargement likely reflecting pulmonary artery hypertension. 5.  Aortic Atherosclerosis (ICD10-I70.0). 6.  Background of fine reticular opacities in the lung periphery including some subpleural sparing in the left upper lobe. Findings may suggest interstitial lung disease. Recommend further evaluation with nonemergent outpatient HRCT. 7. Stable 10 mm subpleural nodule right middle lobe. Unchanged since 2018, likely benign. Electronically Signed   By: Kreg Shropshire M.D.   On: 04/11/2019 02:22        Scheduled Meds: . aspirin EC  81 mg Oral Daily  . atorvastatin   80 mg Oral Daily  . carvedilol  12.5 mg Oral BID WC  . dextromethorphan-guaiFENesin  1 tablet Oral BID  . enoxaparin (LOVENOX) injection  40 mg Subcutaneous Q24H  . furosemide  40 mg Intravenous Q12H  . ipratropium  3 mL Inhalation Q6H  . mometasone-formoterol  2 puff Inhalation BID  . pantoprazole  40 mg Oral Daily  . sacubitril-valsartan  1 tablet Oral Daily  . sodium chloride flush  3 mL Intravenous Q12H  . vitamin B-12  500 mcg Oral Daily   Continuous Infusions: . sodium chloride       LOS: 1 day    Time spent: 35 minutes    Tresa Moore, MD [facility] Pager [phone]  If 7PM-7AM, please contact night-coverage [email or web address] Password [facility] 04/12/2019, 3:13 PM

## 2019-04-12 NOTE — Plan of Care (Signed)
  Problem: Education: Goal: Ability to demonstrate management of disease process will improve Outcome: Progressing   Problem: Education: Goal: Knowledge of General Education information will improve Description: Including pain rating scale, medication(s)/side effects and non-pharmacologic comfort measures Outcome: Progressing   Problem: Clinical Measurements: Goal: Cardiovascular complication will be avoided Outcome: Progressing   Problem: Skin Integrity: Goal: Risk for impaired skin integrity will decrease Outcome: Progressing

## 2019-04-13 LAB — MAGNESIUM: Magnesium: 2.1 mg/dL (ref 1.7–2.4)

## 2019-04-13 LAB — BASIC METABOLIC PANEL
Anion gap: 11 (ref 5–15)
BUN: 27 mg/dL — ABNORMAL HIGH (ref 8–23)
CO2: 27 mmol/L (ref 22–32)
Calcium: 9.2 mg/dL (ref 8.9–10.3)
Chloride: 99 mmol/L (ref 98–111)
Creatinine, Ser: 1.12 mg/dL — ABNORMAL HIGH (ref 0.44–1.00)
GFR calc Af Amer: 59 mL/min — ABNORMAL LOW (ref >60)
GFR calc non Af Amer: 51 mL/min — ABNORMAL LOW (ref >60)
Glucose, Bld: 122 mg/dL — ABNORMAL HIGH (ref 70–99)
Potassium: 3.9 mmol/L (ref 3.5–5.1)
Sodium: 137 mmol/L (ref 135–145)

## 2019-04-13 NOTE — Plan of Care (Signed)
  Problem: Education: Goal: Ability to demonstrate management of disease process will improve Outcome: Progressing   Problem: Education: Goal: Knowledge of General Education information will improve Description: Including pain rating scale, medication(s)/side effects and non-pharmacologic comfort measures Outcome: Progressing   Problem: Clinical Measurements: Goal: Will remain free from infection Outcome: Progressing Goal: Cardiovascular complication will be avoided Outcome: Progressing

## 2019-04-13 NOTE — Consult Note (Signed)
CARDIOLOGY CONSULT NOTE               Patient ID: Barbara Oliver MRN: [id number] DOB/AGE: 1950/10/11 69 y.o.  Admit date: 04/10/2019 Referring Physician Ivor Costa, MD Primary Physician Simona Huh NP Primary Cardiologist none Reason for Consultation congestive heart failure shortness of breath Chest Pain  HPI: Patient is a 69 year old female history of congestive heart failure COPD hyperlipidemia GERD smoker mitral valve disease with left bundle branch block renal insufficiency with worsening shortness of breath.  Patient complained of dry cough and shortness of breath over the last 1 to 2 months worse in the last 10 days.  Patient also complained of chest discomfort midsternal moderate with pleuritic component worse with deep breath.  Denies any leg swelling tenderness pain.  Patient been on Lasix 40 a day and had it increased by Dr. But there was not enough significant improvement so the patient came to the emergency room was found to have elevated BNP borderline low troponins Covid test was negative patient was hypoxic on presentation requiring supplemental oxygen.  Now here for further evaluation  Review of systems complete and found to be negative unless listed above     Past Medical History:  Diagnosis Date  . Abdominal pain, epigastric 10/06/2015  . Acid reflux   . Acute on chronic systolic (congestive) heart failure ([place]) 03/19/2017  . Acute renal insufficiency 10/06/2015  . Acute respiratory failure with hypoxia ([place]) 04/26/2018  . Chronic systolic CHF (congestive heart failure) ([place])   . CKD (chronic kidney disease), stage III   . Congestive dilated cardiomyopathy ([place]) 10/06/2015  . COPD with acute bronchitis ([place]) 04/25/2018  . Dyspnea   . Elevated transaminase level 10/06/2015  . History of noncompliance with medical treatment, presenting hazards to health   . Hyperglycemia 10/06/2015  . LBBB (left bundle branch block)   . Lung nodule    a. seen on prior CT, f/u due  11/2017.  . Mitral regurgitation   . NICM (nonischemic cardiomyopathy) ([place])    a. prior hx of this, EF 20-25% in 11/2016. b. EF 30-35% by cath 03/2017 with normal coronaries.  . Protein-calorie malnutrition, severe 03/07/2016  . Respiratory distress 04/25/2018  . Severe mitral regurgitation 10/06/2015  . Tobacco abuse     Past Surgical History:  Procedure Laterality Date  . CARDIAC CATHETERIZATION    . COLONOSCOPY WITH PROPOFOL N/A 01/26/2019   Procedure: COLONOSCOPY WITH PROPOFOL;  Surgeon: Lucilla Lame, MD;  Location: [facility] ENDOSCOPY;  Service: Endoscopy;  Laterality: N/A;  . ENDARTERECTOMY Left 09/29/2018   Procedure: ENDARTERECTOMY CAROTID ARTERY LEFT;  Surgeon: Angelia Mould, MD;  Location: [place];  Service: Vascular;  Laterality: Left;  . ESOPHAGOGASTRODUODENOSCOPY N/A 01/26/2019   Procedure: ESOPHAGOGASTRODUODENOSCOPY (EGD);  Surgeon: Lucilla Lame, MD;  Location: [facility] ENDOSCOPY;  Service: Endoscopy;  Laterality: N/A;  . HERNIA REPAIR    . PATCH ANGIOPLASTY Left 09/29/2018   Procedure: Patch Angioplasty Left Carotid Artery using Xenosure Biologic Patch;  Surgeon: Angelia Mould, MD;  Location: [place];  Service: Vascular;  Laterality: Left;  . RIGHT/LEFT HEART CATH AND CORONARY ANGIOGRAPHY N/A 03/21/2017   Procedure: RIGHT/LEFT HEART CATH AND CORONARY ANGIOGRAPHY;  Surgeon: Jolaine Artist, MD;  Location: [place] CV LAB;  Service: Cardiovascular;  Laterality: N/A;    Medications Prior to Admission  Medication Sig Dispense Refill Last Dose  . acetaminophen (TYLENOL) 500 MG tablet Take 1,000 mg by mouth daily as needed for mild pain.   prn at prn  .  albuterol (PROVENTIL HFA;VENTOLIN HFA) 108 (90 Base) MCG/ACT inhaler Inhale 1-2 puffs into the lungs every 6 (six) hours as needed for wheezing or shortness of breath. 1 Inhaler 0 prn at prn  . albuterol (PROVENTIL) (2.5 MG/3ML) 0.083% nebulizer solution Take 2.5 mg by nebulization every 6 (six) hours as needed for wheezing  or shortness of breath.    prn at prn  . aspirin EC 81 MG EC tablet Take 1 tablet (81 mg total) by mouth daily.   Unknown at Unknown  . atorvastatin (LIPITOR) 80 MG tablet Take 1 tablet (80 mg total) by mouth daily. 90 tablet 3 04/10/2019 at 1000  . budesonide-formoterol (SYMBICORT) 160-4.5 MCG/ACT inhaler Inhale 2 puffs into the lungs 2 (two) times daily as needed (SOB).   prn at prn  . carvedilol (COREG) 12.5 MG tablet Take 12.5 mg by mouth 2 (two) times daily with a meal.   04/10/2019 at 1000  . ENTRESTO 24-26 MG Take 1 tablet by mouth daily.   04/10/2019 at 1000  . fluticasone (FLONASE) 50 MCG/ACT nasal spray Place 1 spray into both nostrils daily. (Patient taking differently: Place 1 spray into both nostrils daily as needed for allergies. ) 16 g 0 prn at prn  . furosemide (LASIX) 40 MG tablet TAKE 1 TABLET BY MOUTH DAILY. (Patient taking differently: Take 40 mg by mouth 2 (two) times daily. ) 30 tablet 6 04/10/2019 at 1000  . pantoprazole (PROTONIX) 40 MG tablet Take 1 tablet (40 mg total) by mouth daily. 30 tablet 0 04/10/2019 at 1000  . spironolactone (ALDACTONE) 25 MG tablet TAKE 1 TABLET BY MOUTH DAILY. (Patient taking differently: Take 25 mg by mouth daily. ) 30 tablet 6 04/10/2019 at 1000  . umeclidinium-vilanterol (ANORO ELLIPTA) 62.5-25 MCG/INH AEPB Inhale 1 puff into the lungs daily as needed (SOB).   prn at prn  . vitamin B-12 (CYANOCOBALAMIN) 500 MCG tablet Take 1 tablet (500 mcg total) by mouth daily. 30 tablet 1 04/10/2019 at 1000  . Vitamin D, Ergocalciferol, (DRISDOL) 1.25 MG (50000 UT) CAPS capsule Take 50,000 Units by mouth every Saturday.    04/10/2019 at 1000  . benzonatate (TESSALON PERLES) 100 MG capsule Take 1 capsule (100 mg total) by mouth 3 (three) times daily as needed for cough. (Patient not taking: Reported on 04/11/2019) 30 capsule 0 Completed Course at Unknown time  . budesonide-formoterol (SYMBICORT) 160-4.5 MCG/ACT inhaler Inhale 2 puffs into the lungs 2 (two) times daily. 1  Inhaler 1    Social History   Socioeconomic History  . Marital status: Married    Spouse name: Not on file  . Number of children: Not on file  . Years of education: Not on file  . Highest education level: Not on file  Occupational History  . Occupation: retired  Tobacco Use  . Smoking status: Light Tobacco Smoker    Packs/day: 0.50    Years: 50.00    Pack years: 25.00    Types: Cigarettes  . Smokeless tobacco: Never Used  . Tobacco comment: Pt reports a pack will last her about a month  Substance and Sexual Activity  . Alcohol use: No  . Drug use: No  . Sexual activity: Not on file  Other Topics Concern  . Not on file  Social History Narrative  . Not on file   Social Determinants of Health   Financial Resource Strain:   . Difficulty of Paying Living Expenses: Not on file  Food Insecurity:   . Worried About Radiation protection practitioner  of Food in the Last Year: Not on file  . Ran Out of Food in the Last Year: Not on file  Transportation Needs:   . Lack of Transportation (Medical): Not on file  . Lack of Transportation (Non-Medical): Not on file  Physical Activity:   . Days of Exercise per Week: Not on file  . Minutes of Exercise per Session: Not on file  Stress:   . Feeling of Stress : Not on file  Social Connections:   . Frequency of Communication with Friends and Family: Not on file  . Frequency of Social Gatherings with Friends and Family: Not on file  . Attends Religious Services: Not on file  . Active Member of Clubs or Organizations: Not on file  . Attends Banker Meetings: Not on file  . Marital Status: Not on file  Intimate Partner Violence:   . Fear of Current or Ex-Partner: Not on file  . Emotionally Abused: Not on file  . Physically Abused: Not on file  . Sexually Abused: Not on file    Family History  Problem Relation Age of Onset  . Hypertension Mother   . Heart attack Father       Review of systems complete and found to be negative unless  listed above      PHYSICAL EXAM  General: Well developed, well nourished, in no acute distress HEENT:  Normocephalic and atramatic Neck:  No JVD.  Lungs: Clear bilaterally to auscultation and percussion. Heart: HRRR . Normal S1 and S2 without gallops or murmurs.  Abdomen: Bowel sounds are positive, abdomen soft and non-tender  Msk:  Back normal, normal gait. Normal strength and tone for age. Extremities: No clubbing, cyanosis or edema.   Neuro: Alert and oriented X 3. Psych:  Good affect, responds appropriately  Labs:   Lab Results  Component Value Date   WBC 12.0 (H) 04/10/2019   HGB 10.4 (L) 04/10/2019   HCT 34.3 (L) 04/10/2019   MCV 82.3 04/10/2019   PLT 438 (H) 04/10/2019    Recent Labs  Lab 04/13/19 0543  NA 137  K 3.9  CL 99  CO2 27  BUN 27*  CREATININE 1.12*  CALCIUM 9.2  GLUCOSE 122*   Lab Results  Component Value Date   TROPONINI <0.03 04/25/2018    Lab Results  Component Value Date   CHOL 166 04/12/2019   CHOL 188 04/26/2018   Lab Results  Component Value Date   HDL 32 (L) 04/12/2019   HDL 41 04/26/2018   Lab Results  Component Value Date   LDLCALC 117 (H) 04/12/2019   LDLCALC 128 (H) 04/26/2018   Lab Results  Component Value Date   TRIG 83 04/12/2019   TRIG 94 04/26/2018   Lab Results  Component Value Date   CHOLHDL 5.2 04/12/2019   CHOLHDL 4.6 04/26/2018   No results found for: LDLDIRECT    Radiology: DG Chest 2 View  Result Date: 04/10/2019 CLINICAL DATA:  Cough.  Shortness of breath. EXAM: CHEST - 2 VIEW COMPARISON:  March 31, 2019 FINDINGS: Cardiomegaly. Increased interstitial markings are stable to mildly worsened since March 31, 2019. No pneumothorax. No nodules or masses. No focal infiltrates. IMPRESSION: 1. Findings are most consistent with cardiomegaly and mild edema. Interstitial opacities can also be seen with atypical pneumonia is. Recommend clinical correlation. Electronically Signed   By: Gerome Sam III M.D    On: 04/10/2019 18:18   DG Chest 2 View  Result Date: 03/31/2019 CLINICAL DATA:  Cough and shortness of breath EXAM: CHEST - 2 VIEW COMPARISON:  March 29, 2019 FINDINGS: There is cardiomegaly with pulmonary venous hypertension. There is interstitial edema diffusely, increased from recent study. No airspace opacity or pleural effusion. There is aortic atherosclerosis. No adenopathy. There is mild anterior wedging of a midthoracic vertebral body. IMPRESSION: Increased interstitial edema with cardiomegaly and pulmonary vascular congestion. Appearance indicative of a degree of congestive heart failure. No consolidation or pleural effusion. No adenopathy evident. Aortic Atherosclerosis (ICD10-I70.0). Electronically Signed   By: Bretta Bang III M.D.   On: 03/31/2019 12:30   DG Chest 2 View  Result Date: 03/29/2019 CLINICAL DATA:  Shortness of breath, chest pain. EXAM: CHEST - 2 VIEW COMPARISON:  April 25, 2018. FINDINGS: Stable cardiomegaly. Central pulmonary vascular congestion is noted. Atherosclerosis of thoracic aorta is noted. No pneumothorax or pleural effusion is noted. Mild bibasilar interstitial densities are noted which may represent edema. Bony thorax is unremarkable. IMPRESSION: Stable cardiomegaly with central pulmonary vascular congestion. Mild bibasilar interstitial densities are noted which may represent edema. Aortic Atherosclerosis (ICD10-I70.0). Electronically Signed   By: Lupita Raider M.D.   On: 03/29/2019 08:32   CT Angio Chest PE W/Cm &/Or Wo Cm  Result Date: 04/11/2019 CLINICAL DATA:  Acute onset shortness of breath, elevated D-dimer EXAM: CT ANGIOGRAPHY CHEST WITH CONTRAST TECHNIQUE: Multidetector CT imaging of the chest was performed using the standard protocol during bolus administration of intravenous contrast. Multiplanar CT image reconstructions and MIPs were obtained to evaluate the vascular anatomy. CONTRAST:  64mL OMNIPAQUE IOHEXOL 350 MG/ML SOLN COMPARISON:  CT  01/22/2019 FINDINGS: Cardiovascular: Satisfactory opacification the pulmonary arteries to the segmental level. No pulmonary artery filling defects are identified. Central pulmonary arteries borderline enlarged but similar to comparison, possibly reflecting chronic pulmonary artery hypertension. Cardiomegaly with four-chamber enlargement. Coronary artery atherosclerosis is present. No pericardial effusion. Aorta is poorly opacified. Thoracic aorta is upper limits normal caliber. Atherosclerotic plaque within the aorta. Normal branching of the aortic arch. Slight tortuosity of the brachiocephalic vessels without gross abnormality. Mediastinum/Nodes: No pathologically enlarged mediastinal, hilar or axillary adenopathy. No mediastinal fluid or hemorrhage. Thyroid gland and thoracic inlet are unremarkable. No acute abnormality of the trachea or esophagus. Lungs/Pleura: Stable 10 mm subpleural nodule in the right middle lobe. No focal consolidation. Mild interlobular septal thickening is noted in the apices and bases. There is some slight vascular cuffing and vascular redistribution. There is a background of fine reticular opacities in the lung periphery some of which demonstrate subpleural sparing in the left upper lobe. Upper Abdomen: No acute abnormalities present in the visualized portions of the upper abdomen. Musculoskeletal: Multilevel degenerative changes are present in the imaged portions of the spine. No acute osseous abnormality or suspicious osseous lesion. Review of the MIP images confirms the above findings. IMPRESSION: 1. No evidence of acute pulmonary artery filling defect to suggest pulmonary embolism. 2. Mild interlobular septal thickening and vascular redistribution suggestive of mild interstitial edema. 3. Cardiomegaly with four-chamber enlargement. Coronary artery atherosclerosis. 4. Central pulmonary arterial enlargement likely reflecting pulmonary artery hypertension. 5.  Aortic Atherosclerosis  (ICD10-I70.0). 6. Background of fine reticular opacities in the lung periphery including some subpleural sparing in the left upper lobe. Findings may suggest interstitial lung disease. Recommend further evaluation with nonemergent outpatient HRCT. 7. Stable 10 mm subpleural nodule right middle lobe. Unchanged since 2018, likely benign. Electronically Signed   By: Kreg Shropshire M.D.   On: 04/11/2019 02:22   ECHOCARDIOGRAM COMPLETE  Result Date: 04/12/2019  ECHOCARDIOGRAM REPORT   Patient Name:   Barbara Oliver Date of Exam: 04/12/2019 Medical Rec #:  [id number]       Height:       73.0 in Accession #:    [id number]      Weight:       187.1 lb Date of Birth:  Aug 07, 1950      BSA:          2.09 m Patient Age:    68 years        BP:           117/75 mmHg Patient Gender: F               HR:           84 bpm. Exam Location:  [facility] Procedure: 2D Echo, Color Doppler and Cardiac Doppler Indications:     I50.31 CHF-Acute Diastolic  History:         Patient has prior history of Echocardiogram examinations. NICM                  and CHF, CKD and COPD, Arrythmias:LBBB; Risk Factors:Current                  Smoker.  Sonographer:     Humphrey Rolls RDCS (AE) Referring Phys:  [id number] Lorretta Harp Diagnosing Phys: Alwyn Pea MD IMPRESSIONS  1. Left ventricular ejection fraction, by visual estimation, is <20%. The left ventricle has severely decreased function. Left ventricular septal wall thickness was normal. Normal left ventricular posterior wall thickness. There is no left ventricular hypertrophy.  2. Dilated cardiomyopathy.  3. Severely dilated left ventricular internal cavity size.  4. The left ventricle demonstrates global hypokinesis.  5. Global right ventricle has moderately reduced systolic function.The right ventricular size is moderately enlarged. Moderately increased right ventricular wall thickness.  6. Left atrial size was mildly dilated.  7. Right atrial size was mildly dilated.  8. The mitral valve is normal in  structure. Mild to moderate mitral valve regurgitation.  9. The tricuspid valve is normal in structure. 10. The tricuspid valve is normal in structure. Tricuspid valve regurgitation is mild. 11. The aortic valve is grossly normal. Aortic valve regurgitation is trivial. 12. The pulmonic valve was normal in structure. Pulmonic valve regurgitation is trivial. 13. Left ventricular ejection fraction by PLAX is is 15 % FINDINGS  Left Ventricle: Left ventricular ejection fraction, by visual estimation, is <20%. Left ventricular ejection fraction by PLAX is is 15 % The left ventricle has severely decreased function. The left ventricle demonstrates global hypokinesis. The left ventricular internal cavity size was severely dilated left ventricle. Normal left ventricular posterior wall thickness. There is no left ventricular hypertrophy. Findings are consistent with dilated cardiomyopathy. Right Ventricle: The right ventricular size is moderately enlarged. Moderately increased right ventricular wall thickness. Global RV systolic function is has moderately reduced systolic function. Left Atrium: Left atrial size was mildly dilated. Right Atrium: Right atrial size was mildly dilated Pericardium: There is no evidence of pericardial effusion. Mitral Valve: The mitral valve is normal in structure. Mild to moderate mitral valve regurgitation. MV peak gradient, 4.2 mmHg. Tricuspid Valve: The tricuspid valve is normal in structure. Tricuspid valve regurgitation is mild. Aortic Valve: The aortic valve is grossly normal. Aortic valve regurgitation is trivial. Aortic regurgitation PHT measures 457 msec. Aortic valve mean gradient measures 3.0 mmHg. Aortic valve peak gradient measures 5.3 mmHg. Aortic valve area, by VTI measures 1.39 cm. Pulmonic Valve: The  pulmonic valve was normal in structure. Pulmonic valve regurgitation is trivial. Pulmonic regurgitation is trivial. Aorta: The aortic root is normal in size and structure. IAS/Shunts:  No atrial level shunt detected by color flow Doppler.  LEFT VENTRICLE PLAX 2D LV EF:         Left            Diastology                ventricular     LV e' lateral:   5.00 cm/s                ejection        LV E/e' lateral: 24.8                fraction by     LV e' medial:    6.53 cm/s                PLAX is is      LV E/e' medial:  19.0                15 % LVIDd:         6.00 cm LVIDs:         5.59 cm LV PW:         0.97 cm LV IVS:        0.69 cm LVOT diam:     1.70 cm LV SV:         27 ml LV SV Index:   12.87 LVOT Area:     2.27 cm  LV Volumes (MOD) LV area d, A2C:  41.20 cm LV area d, A4C:  50.50 cm LV area s, A2C:  31.10 cm LV area s, A4C:  39.30 cm LV major d, A2C: 8.92 cm LV major d, A4C: 9.34 cm LV major s, A2C: 7.78 cm LV major s, A4C: 8.57 cm LV vol d, MOD    159.0 ml A2C: LV vol d, MOD    229.0 ml A4C: LV vol s, MOD    107.0 ml A2C: LV vol s, MOD    144.0 ml A4C: LV SV MOD A2C:   52.0 ml LV SV MOD A4C:   229.0 ml LV SV MOD BP:    66.0 ml RIGHT VENTRICLE RV Basal diam:  4.08 cm LEFT ATRIUM             Index       RIGHT ATRIUM           Index LA diam:        4.00 cm 1.91 cm/m  RA Area:     14.00 cm LA Vol (A2C):   85.3 ml 40.78 ml/m RA Volume:   39.10 ml  18.69 ml/m LA Vol (A4C):   98.8 ml 47.24 ml/m LA Biplane Vol: 91.6 ml 43.80 ml/m  AORTIC VALVE                   PULMONIC VALVE AV Area (Vmax):    1.50 cm    PV Vmax:       0.82 m/s AV Area (Vmean):   1.37 cm    PV Vmean:      52.600 cm/s AV Area (VTI):     1.39 cm    PV VTI:        0.165 m AV Vmax:           115.00 cm/s PV Peak grad:  2.7 mmHg AV Vmean:  87.500 cm/s PV Mean grad:  1.0 mmHg AV VTI:            0.236 m AV Peak Grad:      5.3 mmHg AV Mean Grad:      3.0 mmHg LVOT Vmax:         76.10 cm/s LVOT Vmean:        53.000 cm/s LVOT VTI:          0.145 m LVOT/AV VTI ratio: 0.61 AI PHT:            457 msec  AORTA Ao Root diam: 3.30 cm MITRAL VALVE MV Area (PHT): 5.66 cm              SHUNTS MV Peak grad:  4.2 mmHg               Systemic VTI:  0.14 m MV Mean grad:  2.0 mmHg              Systemic Diam: 1.70 cm MV Vmax:       1.02 m/s MV Vmean:      61.2 cm/s MV VTI:        0.19 m MV PHT:        38.86 msec MV Decel Time: 134 msec MV E velocity: 124.00 cm/s 103 cm/s MV A velocity: 86.80 cm/s  70.3 cm/s MV E/A ratio:  1.43        1.5  Ahlijah Raia D Aliany Fiorenza MD Electronically signed by Alwyn Pea MD Signature Date/Time: 04/12/2019/5:04:34 PM    Final     EKG: Normal sinus rhythm left bundle branch block nonspecific ST-T wave changes  ASSESSMENT AND PLAN:  Congestive heart failure Dilated cardiomyopathy Atypical chest pain COPD Hyperlipidemia Former smoker GERD Mitral valve regurgitation Left bundle branch block Chronic renal sufficiency stage III Shortness of breath . Plan Agree with admit to telemetry Rule out myocardial infarction follow-up EKGs and troponins Supplemental oxygen for shortness of breath Inhalers for COPD type symptoms Continue diuretic therapy for heart failure Continue Entresto Coreg Lasix spironolactone Recommend advancing dose of Coreg if blood pressure tolerates increase diuretics Agree with echocardiogram left ventricular function and wall motion and valvular structures  Signed: Alwyn Pea MD 04/13/2019, 3:55 PM

## 2019-04-13 NOTE — Plan of Care (Signed)
  Problem: Activity: Goal: Capacity to carry out activities will improve Outcome: Progressing   Problem: Cardiac: Goal: Ability to achieve and maintain adequate cardiopulmonary perfusion will improve Outcome: Progressing   Problem: Clinical Measurements: Goal: Ability to maintain clinical measurements within normal limits will improve Outcome: Progressing Goal: Will remain free from infection Outcome: Progressing Goal: Diagnostic test results will improve Outcome: Progressing Goal: Respiratory complications will improve Outcome: Progressing Goal: Cardiovascular complication will be avoided Outcome: Progressing

## 2019-04-13 NOTE — Progress Notes (Signed)
PROGRESS NOTE    Barbara Oliver  RSW:[id number] DOB: November 26, 1950 DOA: 04/10/2019 PCP: Courtney Paris, NP    Brief Narrative:  HPI: Barbara Oliver is a 69 y.o. female with medical history significant of COPD, hyperlipidemia, GERD, former smoker, mitral valve regurgitation, left bundle blockage, dCHF, CKD stage IIIa, who presents with shortness breath and chest pain.  Patient states that she has been having dry cough and shortness of breath for more than 1 month, which has worsened in the past 10 days.  She also reports chest pain, which is located in the substernal area, moderate, pleuritic, worsening on deep breath. No tenderness in the calf areas.  No fever or chills.  Patient states that her Lasix dose was increased from 40 mg daily to twice a day by her doctor without significant help.  Patient does not have nausea, vomiting, diarrhea, abdominal pain, symptoms of UTI or unilateral weakness.  2/1: Patient seen and examined.  Symptomatically improved.  Diuresing appropriately.  Net -1.5 L since admission.  Feeling well.  Echo in progress.  2/2: Patient seen and examined.  Symptoms improved.  Diuresing well.  Net negative greater than 2 L since admission.  Echocardiogram demonstrates markedly reduced ejection fraction, less than 20%.  Previous echocardiogram in February 2020 had ejection fraction of 60%   Assessment & Plan:   Principal Problem:   Acute on chronic diastolic CHF (congestive heart failure) (HCC) Active Problems:   CKD (chronic kidney disease), stage III   COPD exacerbation (HCC)   Acid reflux   HTN (hypertension)   HLD (hyperlipidemia)   Leukocytosis   Chest pain  Acute on chronic systolic and diastolic CHF:  Patient does not have leg edema physical examination, but has positive JVD, elevated BNP 1319, CT angiogram is negative for PE, showed mild interstitial pulmonary edema and pulmonary hypertension, consistent with CHF exacerbation.  -Echocardiogram demonstrates  ejection fraction of less than 20% Plan: -Continue Lasix 40 mg bid by IV -Daily weights -strict I/O's -Low salt diet -Fluid restriction -Target net -1 to 1.5 L/day -De-escalate diuretic regimen starting tomorrow -Patient had acute decrease in her ejection fraction, consulted cardiology, message sent to Dr. Juliann Pares.  Patient may warrant an inpatient ischemic evaluation  CKD (chronic kidney disease), stage IIIa:  stable. Cre 1.09, BUN 31 -f/u by BMP  COPD exacerbation (HCC):  Patient has wheeze on auscultation. -Bronchodilators, as needed Mucinex  Acid reflux: -PPI  HTN:  -Continue home medications: Coreg, Entresto -hydralazine prn  HLD (hyperlipidemia) -Lipitor  Leukocytosis: WBC 12.0, no fever, likely reactive -f/u by CBC  Chest pain:  Troponin is slightly elevated at 29 --> 24.   CT angiogram is negative for PE.   Most likely due to demand ischemia secondary to CHF exacerbation. -on aspirin, Lipitor, Coreg    DVT prophylaxis: Lovenox Code Status: Full Family Communication: None Disposition Plan: Anticipate home after cardiology evaluation, likely 24 to 48 hours unless inpatient ischemic evaluation as needed  Consultants:   None  Procedures:   2D echocardiogram  Antimicrobials:   None   Subjective: Seen and examined Diuresing adequately, net -2.5 L since admission Echocardiogram, EF less than 20% Cardiology consulted  Objective: Vitals:   04/13/19 0740 04/13/19 0812 04/13/19 1123 04/13/19 1339  BP:  113/73 97/65   Pulse:  82 85   Resp:  18 19   Temp:  98.8 F (37.1 C) 97.6 F (36.4 C)   TempSrc:  Oral Oral   SpO2: 99% 100% 98% 97%  Weight:  Height:        Intake/Output Summary (Last 24 hours) at 04/13/2019 1425 Last data filed at 04/13/2019 0443 Gross per 24 hour  Intake --  Output 1400 ml  Net -1400 ml   Filed Weights   04/11/19 1016 04/12/19 0406 04/13/19 0440  Weight: 85.2 kg 84.9 kg 85.6 kg     Examination:  General exam: Appears calm and comfortable  Respiratory system: Clear to auscultation. Respiratory effort normal. Cardiovascular system: S1-S2, RRR, positive JVD, trace pedal edema  gastrointestinal system: Abdomen is nondistended, soft and nontender. No organomegaly or masses felt. Normal bowel sounds heard. Central nervous system: Alert and oriented. No focal neurological deficits. Extremities: Symmetric 5 x 5 power. Skin: No rashes, lesions or ulcers Psychiatry: Judgement and insight appear normal. Mood & affect appropriate.     Data Reviewed: I have personally reviewed following labs and imaging studies  CBC: Recent Labs  Lab 04/10/19 1622  WBC 12.0*  HGB 10.4*  HCT 34.3*  MCV 82.3  PLT 517*   Basic Metabolic Panel: Recent Labs  Lab 04/10/19 1622 04/12/19 0608 04/13/19 0543  NA 137 137 137  K 3.9 3.3* 3.9  CL 105 98 99  CO2 21* 27 27  GLUCOSE 126* 104* 122*  BUN 31* 21 27*  CREATININE 1.09* 0.96 1.12*  CALCIUM 9.2 9.0 9.2  MG  --  1.9 2.1   GFR: Estimated Creatinine Clearance: 58 mL/min (A) (by C-G formula based on SCr of 1.12 mg/dL (H)). Liver Function Tests: No results for input(s): AST, ALT, ALKPHOS, BILITOT, PROT, ALBUMIN in the last 168 hours. No results for input(s): LIPASE, AMYLASE in the last 168 hours. No results for input(s): AMMONIA in the last 168 hours. Coagulation Profile: No results for input(s): INR, PROTIME in the last 168 hours. Cardiac Enzymes: No results for input(s): CKTOTAL, CKMB, CKMBINDEX, TROPONINI in the last 168 hours. BNP (last 3 results) No results for input(s): PROBNP in the last 8760 hours. HbA1C: Recent Labs    04/12/19 0608  HGBA1C 5.4   CBG: No results for input(s): GLUCAP in the last 168 hours. Lipid Profile: Recent Labs    04/12/19 0608  CHOL 166  HDL 32*  LDLCALC 117*  TRIG 83  CHOLHDL 5.2   Thyroid Function Tests: No results for input(s): TSH, T4TOTAL, FREET4, T3FREE, THYROIDAB in the  last 72 hours. Anemia Panel: No results for input(s): VITAMINB12, FOLATE, FERRITIN, TIBC, IRON, RETICCTPCT in the last 72 hours. Sepsis Labs: No results for input(s): PROCALCITON, LATICACIDVEN in the last 168 hours.  Recent Results (from the past 240 hour(s))  Respiratory Panel by RT PCR (Flu A&B, Covid) - Nasopharyngeal Swab     Status: None   Collection Time: 04/11/19  3:12 AM   Specimen: Nasopharyngeal Swab  Result Value Ref Range Status   SARS Coronavirus 2 by RT PCR NEGATIVE NEGATIVE Final    Comment: (NOTE) SARS-CoV-2 target nucleic acids are NOT DETECTED. The SARS-CoV-2 RNA is generally detectable in upper respiratoy specimens during the acute phase of infection. The lowest concentration of SARS-CoV-2 viral copies this assay can detect is 131 copies/mL. A negative result does not preclude SARS-Cov-2 infection and should not be used as the sole basis for treatment or other patient management decisions. A negative result may occur with  improper specimen collection/handling, submission of specimen other than nasopharyngeal swab, presence of viral mutation(s) within the areas targeted by this assay, and inadequate number of viral copies (<131 copies/mL). A negative result must be combined with  clinical observations, patient history, and epidemiological information. The expected result is Negative. Fact Sheet for Patients:  https://www.moore.com/ Fact Sheet for Healthcare Providers:  https://www.young.biz/ This test is not yet ap proved or cleared by the [place] FDA and  has been authorized for detection and/or diagnosis of SARS-CoV-2 by FDA under an Emergency Use Authorization (EUA). This EUA will remain  in effect (meaning this test can be used) for the duration of the COVID-19 declaration under Section 564(b)(1) of the Act, 21 U.S.C. section 360bbb-3(b)(1), unless the authorization is terminated or revoked sooner.    Influenza A  by PCR NEGATIVE NEGATIVE Final   Influenza B by PCR NEGATIVE NEGATIVE Final    Comment: (NOTE) The Xpert Xpress SARS-CoV-2/FLU/RSV assay is intended as an aid in  the diagnosis of influenza from Nasopharyngeal swab specimens and  should not be used as a sole basis for treatment. Nasal washings and  aspirates are unacceptable for Xpert Xpress SARS-CoV-2/FLU/RSV  testing. Fact Sheet for Patients: https://www.moore.com/ Fact Sheet for Healthcare Providers: https://www.young.biz/ This test is not yet approved or cleared by the [place] FDA and  has been authorized for detection and/or diagnosis of SARS-CoV-2 by  FDA under an Emergency Use Authorization (EUA). This EUA will remain  in effect (meaning this test can be used) for the duration of the  Covid-19 declaration under Section 564(b)(1) of the Act, 21  U.S.C. section 360bbb-3(b)(1), unless the authorization is  terminated or revoked. Performed at [facility], [address], [place], [place] [address]          Radiology Studies: ECHOCARDIOGRAM COMPLETE  Result Date: 04/12/2019   ECHOCARDIOGRAM REPORT   Patient Name:   MARIM KLOPP Date of Exam: 04/12/2019 Medical Rec #:  [id number]       Height:       73.0 in Accession #:    [id number]      Weight:       187.1 lb Date of Birth:  [birth year]      BSA:          2.09 m Patient Age:    68 years        BP:           117/75 mmHg Patient Gender: F               HR:           84 bpm. Exam Location:  [facility] Procedure: 2D Echo, Color Doppler and Cardiac Doppler Indications:     I50.31 CHF-Acute Diastolic  History:         Patient has prior history of Echocardiogram examinations. NICM                  and CHF, CKD and COPD, Arrythmias:LBBB; Risk Factors:Current                  Smoker.  Sonographer:     Humphrey Rolls RDCS (AE) Referring Phys:  [id number] Lorretta Harp Diagnosing Phys: Alwyn Pea MD IMPRESSIONS  1. Left ventricular ejection fraction, by  visual estimation, is <20%. The left ventricle has severely decreased function. Left ventricular septal wall thickness was normal. Normal left ventricular posterior wall thickness. There is no left ventricular hypertrophy.  2. Dilated cardiomyopathy.  3. Severely dilated left ventricular internal cavity size.  4. The left ventricle demonstrates global hypokinesis.  5. Global right ventricle has moderately reduced systolic function.The right ventricular size is moderately enlarged. Moderately increased right ventricular wall thickness.  6. Left atrial size was mildly dilated.  7. Right atrial size was mildly dilated.  8. The mitral valve is normal in structure. Mild to moderate mitral valve regurgitation.  9. The tricuspid valve is normal in structure. 10. The tricuspid valve is normal in structure. Tricuspid valve regurgitation is mild. 11. The aortic valve is grossly normal. Aortic valve regurgitation is trivial. 12. The pulmonic valve was normal in structure. Pulmonic valve regurgitation is trivial. 13. Left ventricular ejection fraction by PLAX is is 15 % FINDINGS  Left Ventricle: Left ventricular ejection fraction, by visual estimation, is <20%. Left ventricular ejection fraction by PLAX is is 15 % The left ventricle has severely decreased function. The left ventricle demonstrates global hypokinesis. The left ventricular internal cavity size was severely dilated left ventricle. Normal left ventricular posterior wall thickness. There is no left ventricular hypertrophy. Findings are consistent with dilated cardiomyopathy. Right Ventricle: The right ventricular size is moderately enlarged. Moderately increased right ventricular wall thickness. Global RV systolic function is has moderately reduced systolic function. Left Atrium: Left atrial size was mildly dilated. Right Atrium: Right atrial size was mildly dilated Pericardium: There is no evidence of pericardial effusion. Mitral Valve: The mitral valve is normal in  structure. Mild to moderate mitral valve regurgitation. MV peak gradient, 4.2 mmHg. Tricuspid Valve: The tricuspid valve is normal in structure. Tricuspid valve regurgitation is mild. Aortic Valve: The aortic valve is grossly normal. Aortic valve regurgitation is trivial. Aortic regurgitation PHT measures 457 msec. Aortic valve mean gradient measures 3.0 mmHg. Aortic valve peak gradient measures 5.3 mmHg. Aortic valve area, by VTI measures 1.39 cm. Pulmonic Valve: The pulmonic valve was normal in structure. Pulmonic valve regurgitation is trivial. Pulmonic regurgitation is trivial. Aorta: The aortic root is normal in size and structure. IAS/Shunts: No atrial level shunt detected by color flow Doppler.  LEFT VENTRICLE PLAX 2D LV EF:         Left            Diastology                ventricular     LV e' lateral:   5.00 cm/s                ejection        LV E/e' lateral: 24.8                fraction by     LV e' medial:    6.53 cm/s                PLAX is is      LV E/e' medial:  19.0                15 % LVIDd:         6.00 cm LVIDs:         5.59 cm LV PW:         0.97 cm LV IVS:        0.69 cm LVOT diam:     1.70 cm LV SV:         27 ml LV SV Index:   12.87 LVOT Area:     2.27 cm  LV Volumes (MOD) LV area d, A2C:  41.20 cm LV area d, A4C:  50.50 cm LV area s, A2C:  31.10 cm LV area s, A4C:  39.30 cm LV major d, A2C: 8.92 cm LV major d, A4C: 9.34 cm LV major s, A2C: 7.78  cm LV major s, A4C: 8.57 cm LV vol d, MOD    159.0 ml A2C: LV vol d, MOD    229.0 ml A4C: LV vol s, MOD    107.0 ml A2C: LV vol s, MOD    144.0 ml A4C: LV SV MOD A2C:   52.0 ml LV SV MOD A4C:   229.0 ml LV SV MOD BP:    66.0 ml RIGHT VENTRICLE RV Basal diam:  4.08 cm LEFT ATRIUM             Index       RIGHT ATRIUM           Index LA diam:        4.00 cm 1.91 cm/m  RA Area:     14.00 cm LA Vol (A2C):   85.3 ml 40.78 ml/m RA Volume:   39.10 ml  18.69 ml/m LA Vol (A4C):   98.8 ml 47.24 ml/m LA Biplane Vol: 91.6 ml 43.80 ml/m  AORTIC VALVE                    PULMONIC VALVE AV Area (Vmax):    1.50 cm    PV Vmax:       0.82 m/s AV Area (Vmean):   1.37 cm    PV Vmean:      52.600 cm/s AV Area (VTI):     1.39 cm    PV VTI:        0.165 m AV Vmax:           115.00 cm/s PV Peak grad:  2.7 mmHg AV Vmean:          87.500 cm/s PV Mean grad:  1.0 mmHg AV VTI:            0.236 m AV Peak Grad:      5.3 mmHg AV Mean Grad:      3.0 mmHg LVOT Vmax:         76.10 cm/s LVOT Vmean:        53.000 cm/s LVOT VTI:          0.145 m LVOT/AV VTI ratio: 0.61 AI PHT:            457 msec  AORTA Ao Root diam: 3.30 cm MITRAL VALVE MV Area (PHT): 5.66 cm              SHUNTS MV Peak grad:  4.2 mmHg              Systemic VTI:  0.14 m MV Mean grad:  2.0 mmHg              Systemic Diam: 1.70 cm MV Vmax:       1.02 m/s MV Vmean:      61.2 cm/s MV VTI:        0.19 m MV PHT:        38.86 msec MV Decel Time: 134 msec MV E velocity: 124.00 cm/s 103 cm/s MV A velocity: 86.80 cm/s  70.3 cm/s MV E/A ratio:  1.43        1.5  Dwayne D Callwood MD Electronically signed by Alwyn Pea MD Signature Date/Time: 04/12/2019/5:04:34 PM    Final         Scheduled Meds: . aspirin EC  81 mg Oral Daily  . atorvastatin  80 mg Oral Daily  . carvedilol  12.5 mg Oral BID WC  . dextromethorphan-guaiFENesin  1 tablet Oral BID  .  enoxaparin (LOVENOX) injection  40 mg Subcutaneous Q24H  . furosemide  40 mg Intravenous Q12H  . ipratropium  3 mL Inhalation Q6H  . mometasone-formoterol  2 puff Inhalation BID  . pantoprazole  40 mg Oral Daily  . sacubitril-valsartan  1 tablet Oral Daily  . sodium chloride flush  3 mL Intravenous Q12H  . vitamin B-12  500 mcg Oral Daily   Continuous Infusions: . sodium chloride       LOS: 2 days    Time spent: 35 minutes    Tresa Moore, MD [facility] Pager [phone]  If 7PM-7AM, please contact night-coverage [email or web address] Password [email or web address] 04/13/2019, 2:25 PM

## 2019-04-14 DIAGNOSIS — K219 Gastro-esophageal reflux disease without esophagitis: Secondary | ICD-10-CM

## 2019-04-14 LAB — BASIC METABOLIC PANEL
Anion gap: 12 (ref 5–15)
BUN: 30 mg/dL — ABNORMAL HIGH (ref 8–23)
CO2: 28 mmol/L (ref 22–32)
Calcium: 9.7 mg/dL (ref 8.9–10.3)
Chloride: 98 mmol/L (ref 98–111)
Creatinine, Ser: 1.13 mg/dL — ABNORMAL HIGH (ref 0.44–1.00)
GFR calc Af Amer: 58 mL/min — ABNORMAL LOW (ref >60)
GFR calc non Af Amer: 50 mL/min — ABNORMAL LOW (ref >60)
Glucose, Bld: 102 mg/dL — ABNORMAL HIGH (ref 70–99)
Potassium: 4.1 mmol/L (ref 3.5–5.1)
Sodium: 138 mmol/L (ref 135–145)

## 2019-04-14 LAB — MAGNESIUM: Magnesium: 2.2 mg/dL (ref 1.7–2.4)

## 2019-04-14 MED ORDER — SPIRONOLACTONE 25 MG PO TABS
25.0000 mg | ORAL_TABLET | Freq: Every day | ORAL | Status: DC
  Administered 2019-04-14 – 2019-04-16 (×3): 25 mg via ORAL
  Filled 2019-04-14 (×3): qty 1

## 2019-04-14 MED ORDER — SACUBITRIL-VALSARTAN 24-26 MG PO TABS
2.0000 | ORAL_TABLET | Freq: Every day | ORAL | Status: DC
  Administered 2019-04-15 – 2019-04-16 (×2): 2 via ORAL
  Filled 2019-04-14 (×2): qty 2

## 2019-04-14 MED ORDER — DIGOXIN 125 MCG PO TABS
0.1250 mg | ORAL_TABLET | Freq: Every day | ORAL | Status: DC
  Administered 2019-04-15 – 2019-04-16 (×2): 0.125 mg via ORAL
  Filled 2019-04-14 (×2): qty 1

## 2019-04-14 MED ORDER — METHYLPREDNISOLONE SODIUM SUCC 125 MG IJ SOLR
60.0000 mg | Freq: Four times a day (QID) | INTRAMUSCULAR | Status: DC
  Administered 2019-04-14 – 2019-04-16 (×8): 60 mg via INTRAVENOUS
  Filled 2019-04-14 (×8): qty 2

## 2019-04-14 MED ORDER — BENZONATATE 100 MG PO CAPS
200.0000 mg | ORAL_CAPSULE | Freq: Two times a day (BID) | ORAL | Status: DC
  Administered 2019-04-14 – 2019-04-16 (×5): 200 mg via ORAL
  Filled 2019-04-14 (×5): qty 2

## 2019-04-14 MED ORDER — DIGOXIN 125 MCG PO TABS
0.0625 mg | ORAL_TABLET | Freq: Every day | ORAL | Status: DC
  Filled 2019-04-14 (×2): qty 0.5

## 2019-04-14 NOTE — Care Management Important Message (Signed)
Important Message  Patient Details  Name: Barbara Oliver MRN: [id number] Date of Birth: 1950-09-15   Medicare Important Message Given:  Yes     Johnell Comings 04/14/2019, 11:07 AM

## 2019-04-14 NOTE — Progress Notes (Addendum)
PROGRESS NOTE    Barbara Oliver  DVV:[id number] DOB: Sep 10, 1950 DOA: 04/10/2019 PCP: Courtney Paris, NP    Brief Narrative:  HPI: Barbara Oliver is a 69 y.o. female with medical history significant of COPD, hyperlipidemia, GERD, former smoker, mitral valve regurgitation, left bundle blockage, dCHF, CKD stage IIIa, who presents with shortness breath and chest pain.  Patient states that she has been having dry cough and shortness of breath for more than 1 month, which has worsened in the past 10 days.  She also reports chest pain, which is located in the substernal area, moderate, pleuritic, worsening on deep breath. No tenderness in the calf areas.  No fever or chills.  Patient states that her Lasix dose was increased from 40 mg daily to twice a day by her doctor without significant help.  Patient does not have nausea, vomiting, diarrhea, abdominal pain, symptoms of UTI or unilateral weakness.  2/1: Patient seen and examined.  Symptomatically improved.  Diuresing appropriately.  Net -1.5 L since admission.  Feeling well.  Echo in progress.  2/2: Patient seen and examined.  Symptoms improved.  Diuresing well.  Net negative greater than 2 L since admission.  Echocardiogram demonstrates markedly reduced ejection fraction, less than 20%.  Previous echocardiogram in February 2020 had ejection fraction of 60%  2/3: seems very dyspneic at rest. Sitting in bed, cough, wheezing. sob +, neg 1.8 liters  Assessment & Plan:   Principal Problem:   Acute on chronic diastolic CHF (congestive heart failure) (HCC) Active Problems:   CKD (chronic kidney disease), stage III   COPD exacerbation (HCC)   Acid reflux   HTN (hypertension)   HLD (hyperlipidemia)   Leukocytosis   Chest pain  Acute on chronic systolic and diastolic CHF:  Patient does not have leg edema physical examination, but has positive JVD, elevated BNP 1319, CT angiogram is negative for PE, showed mild interstitial pulmonary edema and  pulmonary hypertension, consistent with CHF exacerbation.  -Echocardiogram demonstrates ejection fraction of less than 20% Plan: -Continue Lasix 40 mg bid by IV, increase digoxin to 0.125 mg daily -Daily weights -strict I/O's -Low salt diet -Fluid restriction -Target net -1 to 1.5 L/day -possible De-escalation of diuretic regimen starting tomorrow/at D/C  -Cardio no leaning towards ischemic eval at this time  CKD (chronic kidney disease), stage IIIa:  stable. Cre 1.09, BUN 31 -f/u by BMP  COPD exacerbation [facility]):  Patient has wheeze on auscultation. Add IV solumedrol and tessalon pearls -Bronchodilators, as needed Mucinex  Acid reflux: -PPI  HTN:  -Continue home medications: Coreg, Entresto -hydralazine prn  HLD (hyperlipidemia) -Lipitor  Leukocytosis: WBC 12.0, no fever, likely reactive -f/u by CBC  Chest pain:  Troponin is slightly elevated at 29 --> 24.   CT angiogram is negative for PE.   Most likely due to demand ischemia secondary to CHF exacerbation. -on aspirin, Lipitor, Coreg    DVT prophylaxis: Lovenox Code Status: Full Family Communication: None Disposition Plan: Anticipate home after cardiology evaluation, likely 24 to 48 hours unless inpatient ischemic evaluation as needed  Consultants:   None  Procedures:   2D echocardiogram  Antimicrobials:   None   Subjective: Seen and examined Diuresing adequately, net -1.8 L since admission Echocardiogram, EF less than 20% Cardiology seen  Objective: Vitals:   04/14/19 1400 04/14/19 1534 04/14/19 1955 04/14/19 2056  BP:  122/73 98/66   Pulse:  87 80   Resp:  16 18   Temp:  98.5 F (36.9 C) 97.8 F (  36.6 C)   TempSrc:  Oral Oral   SpO2: 95% 99% 97% 99%  Weight:      Height:        Intake/Output Summary (Last 24 hours) at 04/14/2019 2109 Last data filed at 04/14/2019 1700 Gross per 24 hour  Intake 1440 ml  Output 600 ml  Net 840 ml   Filed Weights   04/12/19 0406 04/13/19 0440  04/14/19 0512  Weight: 84.9 kg 85.6 kg 85 kg    Examination:  General exam: Appears calm and comfortable  Respiratory system: Clear to auscultation. Respiratory effort normal. Cardiovascular system: S1-S2, RRR, positive JVD, trace pedal edema  gastrointestinal system: Abdomen is nondistended, soft and nontender. No organomegaly or masses felt. Normal bowel sounds heard. Central nervous system: Alert and oriented. No focal neurological deficits. Extremities: Symmetric 5 x 5 power. Skin: No rashes, lesions or ulcers Psychiatry: Judgement and insight appear normal. Mood & affect appropriate.     Data Reviewed: I have personally reviewed following labs and imaging studies  CBC: Recent Labs  Lab 04/10/19 1622  WBC 12.0*  HGB 10.4*  HCT 34.3*  MCV 82.3  PLT 625*   Basic Metabolic Panel: Recent Labs  Lab 04/10/19 1622 04/12/19 0608 04/13/19 0543 04/14/19 0653  NA 137 137 137 138  K 3.9 3.3* 3.9 4.1  CL 105 98 99 98  CO2 21* 27 27 28   GLUCOSE 126* 104* 122* 102*  BUN 31* 21 27* 30*  CREATININE 1.09* 0.96 1.12* 1.13*  CALCIUM 9.2 9.0 9.2 9.7  MG  --  1.9 2.1 2.2   GFR: Estimated Creatinine Clearance: 57.5 mL/min (A) (by C-G formula based on SCr of 1.13 mg/dL (H)). Liver Function Tests: No results for input(s): AST, ALT, ALKPHOS, BILITOT, PROT, ALBUMIN in the last 168 hours. No results for input(s): LIPASE, AMYLASE in the last 168 hours. No results for input(s): AMMONIA in the last 168 hours. Coagulation Profile: No results for input(s): INR, PROTIME in the last 168 hours. Cardiac Enzymes: No results for input(s): CKTOTAL, CKMB, CKMBINDEX, TROPONINI in the last 168 hours. BNP (last 3 results) No results for input(s): PROBNP in the last 8760 hours. HbA1C: Recent Labs    04/12/19 0608  HGBA1C 5.4   CBG: No results for input(s): GLUCAP in the last 168 hours. Lipid Profile: Recent Labs    04/12/19 0608  CHOL 166  HDL 32*  LDLCALC 117*  TRIG 83  CHOLHDL 5.2     Thyroid Function Tests: No results for input(s): TSH, T4TOTAL, FREET4, T3FREE, THYROIDAB in the last 72 hours. Anemia Panel: No results for input(s): VITAMINB12, FOLATE, FERRITIN, TIBC, IRON, RETICCTPCT in the last 72 hours. Sepsis Labs: No results for input(s): PROCALCITON, LATICACIDVEN in the last 168 hours.  Recent Results (from the past 240 hour(s))  Respiratory Panel by RT PCR (Flu A&B, Covid) - Nasopharyngeal Swab     Status: None   Collection Time: 04/11/19  3:12 AM   Specimen: Nasopharyngeal Swab  Result Value Ref Range Status   SARS Coronavirus 2 by RT PCR NEGATIVE NEGATIVE Final    Comment: (NOTE) SARS-CoV-2 target nucleic acids are NOT DETECTED. The SARS-CoV-2 RNA is generally detectable in upper respiratoy specimens during the acute phase of infection. The lowest concentration of SARS-CoV-2 viral copies this assay can detect is 131 copies/mL. A negative result does not preclude SARS-Cov-2 infection and should not be used as the sole basis for treatment or other patient management decisions. A negative result may occur with  improper  specimen collection/handling, submission of specimen other than nasopharyngeal swab, presence of viral mutation(s) within the areas targeted by this assay, and inadequate number of viral copies (<131 copies/mL). A negative result must be combined with clinical observations, patient history, and epidemiological information. The expected result is Negative. Fact Sheet for Patients:  https://www.moore.com/ Fact Sheet for Healthcare Providers:  https://www.young.biz/ This test is not yet ap proved or cleared by the [place] FDA and  has been authorized for detection and/or diagnosis of SARS-CoV-2 by FDA under an Emergency Use Authorization (EUA). This EUA will remain  in effect (meaning this test can be used) for the duration of the COVID-19 declaration under Section 564(b)(1) of the Act, 21  U.S.C. section 360bbb-3(b)(1), unless the authorization is terminated or revoked sooner.    Influenza A by PCR NEGATIVE NEGATIVE Final   Influenza B by PCR NEGATIVE NEGATIVE Final    Comment: (NOTE) The Xpert Xpress SARS-CoV-2/FLU/RSV assay is intended as an aid in  the diagnosis of influenza from Nasopharyngeal swab specimens and  should not be used as a sole basis for treatment. Nasal washings and  aspirates are unacceptable for Xpert Xpress SARS-CoV-2/FLU/RSV  testing. Fact Sheet for Patients: https://www.moore.com/ Fact Sheet for Healthcare Providers: https://www.young.biz/ This test is not yet approved or cleared by the [place] FDA and  has been authorized for detection and/or diagnosis of SARS-CoV-2 by  FDA under an Emergency Use Authorization (EUA). This EUA will remain  in effect (meaning this test can be used) for the duration of the  Covid-19 declaration under Section 564(b)(1) of the Act, 21  U.S.C. section 360bbb-3(b)(1), unless the authorization is  terminated or revoked. Performed at [facility], [address], [place], [place] [address]          Radiology Studies: No results found.      Scheduled Meds: . aspirin EC  81 mg Oral Daily  . atorvastatin  80 mg Oral Daily  . benzonatate  200 mg Oral BID  . carvedilol  12.5 mg Oral BID WC  . dextromethorphan-guaiFENesin  1 tablet Oral BID  . [START ON 04/15/2019] digoxin  0.125 mg Oral Daily  . enoxaparin (LOVENOX) injection  40 mg Subcutaneous Q24H  . furosemide  40 mg Intravenous Q12H  . ipratropium  3 mL Inhalation Q6H  . methylPREDNISolone (SOLU-MEDROL) injection  60 mg Intravenous Q6H  . mometasone-formoterol  2 puff Inhalation BID  . pantoprazole  40 mg Oral Daily  . [START ON 04/15/2019] sacubitril-valsartan  2 tablet Oral Daily  . sodium chloride flush  3 mL Intravenous Q12H  . spironolactone  25 mg Oral Daily  . vitamin B-12  500 mcg Oral Daily    Continuous Infusions: . sodium chloride       LOS: 3 days    Time spent: 35 minutes    Delfino Lovett, MD [facility] Pager [phone]  If 7PM-7AM, please contact night-coverage [email or web address] Password [email or web address] 04/14/2019, 9:09 PM

## 2019-04-14 NOTE — Progress Notes (Signed)
[facility]    SUBJECTIVE: Patient states she feels better today after diuretic therapy cough and is better after cough therapy.  Shortness of breath is much improved no significant leg swelling no chest pain has more energy feels much improved no fever chills or sweats improved coughing.  Feels almost well enough to go home   Vitals:   04/14/19 0749 04/14/19 1159 04/14/19 1400 04/14/19 1534  BP: (!) 139/98 110/69  122/73  Pulse: 83 79  87  Resp: 20 19  16   Temp: 98 F (36.7 C) 97.9 F (36.6 C)  98.5 F (36.9 C)  TempSrc: Oral Oral  Oral  SpO2: 99% 99% 95% 99%  Weight:      Height:         Intake/Output Summary (Last 24 hours) at 04/14/2019 1658 Last data filed at 04/14/2019 1300 Gross per 24 hour  Intake 960 ml  Output 600 ml  Net 360 ml      PHYSICAL EXAM  General: Well developed, well nourished, in no acute distress HEENT:  Normocephalic and atramatic Neck:  No JVD.  Lungs: Clear bilaterally to auscultation and percussion. Heart: HRRR . Normal S1 and S2 without gallops or murmurs.  Abdomen: Bowel sounds are positive, abdomen soft and non-tender  Msk:  Back normal, normal gait. Normal strength and tone for age. Extremities: No clubbing, cyanosis or edema.   Neuro: Alert and oriented X 3. Psych:  Good affect, responds appropriately   LABS: Basic Metabolic Panel: Recent Labs    04/13/19 0543 04/14/19 0653  NA 137 138  K 3.9 4.1  CL 99 98  CO2 27 28  GLUCOSE 122* 102*  BUN 27* 30*  CREATININE 1.12* 1.13*  CALCIUM 9.2 9.7  MG 2.1 2.2   Liver Function Tests: No results for input(s): AST, ALT, ALKPHOS, BILITOT, PROT, ALBUMIN in the last 72 hours. No results for input(s): LIPASE, AMYLASE in the last 72 hours. CBC: No results for input(s): WBC, NEUTROABS, HGB, HCT, MCV, PLT in the last 72 hours. Cardiac Enzymes: No results for input(s): CKTOTAL, CKMB, CKMBINDEX, TROPONINI in the last 72 hours. BNP: Invalid input(s): POCBNP D-Dimer: No results for  input(s): DDIMER in the last 72 hours. Hemoglobin A1C: Recent Labs    04/12/19 0608  HGBA1C 5.4   Fasting Lipid Panel: Recent Labs    04/12/19 0608  CHOL 166  HDL 32*  LDLCALC 117*  TRIG 83  CHOLHDL 5.2   Thyroid Function Tests: No results for input(s): TSH, T4TOTAL, T3FREE, THYROIDAB in the last 72 hours.  Invalid input(s): FREET3 Anemia Panel: No results for input(s): VITAMINB12, FOLATE, FERRITIN, TIBC, IRON, RETICCTPCT in the last 72 hours.  No results found.   Echo severely depressed left ventricular function ejection fraction of less than 25%  TELEMETRY: Normal sinus rhythm interventricular conduction delay nonspecific changes  ASSESSMENT AND PLAN:  Principal Problem:   Acute on chronic diastolic CHF (congestive heart failure) (HCC) Active Problems:   CKD (chronic kidney disease), stage III   COPD exacerbation (HCC)   Acid reflux   HTN (hypertension)   HLD (hyperlipidemia)   Leukocytosis   Chest pain Shortness of breath Cough Chronic renal insufficiency Nonischemic cardiomyopathy severe . Plan Continue telemetry No evidence of myocardial infarction Continue diuretic therapy Increase Entresto to 49/51 twice a day Continue Coreg for hypertension and heart failure Maintain spironolactone for heart failure symptoms Consider adding low-dose digoxin 0.125 daily to help with heart failure Increase activity Maintain reflux therapy for GERD symptoms Inhalers as  necessary for COPD Consider following up with a BNP to see if it is improving Since the patient is clinically improving would consider discharge home soon with follow-up in cardiology 1 to 2 weeks     Alwyn Pea, MD, [facility] 04/14/2019 4:58 PM

## 2019-04-15 DIAGNOSIS — J441 Chronic obstructive pulmonary disease with (acute) exacerbation: Secondary | ICD-10-CM

## 2019-04-15 LAB — CBC
HCT: 37.6 % (ref 36.0–46.0)
Hemoglobin: 11.7 g/dL — ABNORMAL LOW (ref 12.0–15.0)
MCH: 25.3 pg — ABNORMAL LOW (ref 26.0–34.0)
MCHC: 31.1 g/dL (ref 30.0–36.0)
MCV: 81.2 fL (ref 80.0–100.0)
Platelets: 454 10*3/uL — ABNORMAL HIGH (ref 150–400)
RBC: 4.63 MIL/uL (ref 3.87–5.11)
RDW: 19.6 % — ABNORMAL HIGH (ref 11.5–15.5)
WBC: 10.3 10*3/uL (ref 4.0–10.5)
nRBC: 0 % (ref 0.0–0.2)

## 2019-04-15 LAB — MAGNESIUM: Magnesium: 2.2 mg/dL (ref 1.7–2.4)

## 2019-04-15 LAB — BASIC METABOLIC PANEL
Anion gap: 11 (ref 5–15)
BUN: 37 mg/dL — ABNORMAL HIGH (ref 8–23)
CO2: 24 mmol/L (ref 22–32)
Calcium: 9.9 mg/dL (ref 8.9–10.3)
Chloride: 99 mmol/L (ref 98–111)
Creatinine, Ser: 1.1 mg/dL — ABNORMAL HIGH (ref 0.44–1.00)
GFR calc Af Amer: >60 mL/min (ref >60)
GFR calc non Af Amer: 52 mL/min — ABNORMAL LOW (ref >60)
Glucose, Bld: 161 mg/dL — ABNORMAL HIGH (ref 70–99)
Potassium: 4.4 mmol/L (ref 3.5–5.1)
Sodium: 134 mmol/L — ABNORMAL LOW (ref 135–145)

## 2019-04-15 MED ORDER — IPRATROPIUM BROMIDE 0.02 % IN SOLN
0.5000 mg | Freq: Four times a day (QID) | RESPIRATORY_TRACT | Status: DC
  Administered 2019-04-15 – 2019-04-16 (×4): 0.5 mg via RESPIRATORY_TRACT
  Filled 2019-04-15 (×5): qty 2.5

## 2019-04-15 NOTE — Progress Notes (Signed)
PROGRESS NOTE    Barbara Oliver  YSA:[id number] DOB: [birth year] DOA: 04/10/2019 PCP: Courtney Paris, NP    Brief Narrative:  HPI: Barbara Oliver is a 69 y.o. female with medical history significant of COPD, hyperlipidemia, GERD, former smoker, mitral valve regurgitation, left bundle blockage, dCHF, CKD stage IIIa, who presents with shortness breath and chest pain.  Patient states that she has been having dry cough and shortness of breath for more than 1 month, which has worsened in the past 10 days.  She also reports chest pain, which is located in the substernal area, moderate, pleuritic, worsening on deep breath. No tenderness in the calf areas.  No fever or chills.  Patient states that her Lasix dose was increased from 40 mg daily to twice a day by her doctor without significant help.  Patient does not have nausea, vomiting, diarrhea, abdominal pain, symptoms of UTI or unilateral weakness.  2/1: Patient seen and examined.  Symptomatically improved.  Diuresing appropriately.  Net -1.5 L since admission.  Feeling well.  Echo in progress.  2/2: Patient seen and examined.  Symptoms improved.  Diuresing well.  Net negative greater than 2 L since admission.  Echocardiogram demonstrates markedly reduced ejection fraction, less than 20%.  Previous echocardiogram in February 2020 had ejection fraction of 60%  2/3: seems very dyspneic at rest. Sitting in bed, cough, wheezing. sob +, neg 1.8 liters  2/4: dyspnea somewhat better than y'day. Still wheezy. Cough +, neg 2.5 liters  Assessment & Plan:   Principal Problem:   Acute on chronic diastolic CHF (congestive heart failure) (HCC) Active Problems:   CKD (chronic kidney disease), stage III   COPD exacerbation (HCC)   Acid reflux   HTN (hypertension)   HLD (hyperlipidemia)   Leukocytosis   Chest pain  Acute on chronic systolic and diastolic CHF:  Patient does not have leg edema physical examination, but has positive JVD, elevated BNP  1319, CT angiogram is negative for PE, showed mild interstitial pulmonary edema and pulmonary hypertension, consistent with CHF exacerbation.  -Echocardiogram demonstrates ejection fraction of less than 20% -Continue Lasix 40 mg bid by IV, increase digoxin to 0.125 mg daily -Daily weights -strict I/O's -Low salt diet -Fluid restriction -neg 2.5 liters -possible De-escalation of diuretic regimen starting tomorrow/at D/C  -Cardio not leaning towards ischemic eval at this time  CKD (chronic kidney disease), stage IIIa:  stable. Cre 1.09, BUN 31 -f/u by BMP  COPD exacerbation [facility]):  Patient continues to wheeze on auscultation. continue IV solumedrol and tessalon pearls -Bronchodilators, as needed Mucinex  Acid reflux: -PPI  HTN:  -Continue home medications: Coreg, Entresto -hydralazine prn  HLD (hyperlipidemia) -Lipitor  Leukocytosis: WBC 12.0, no fever, likely reactive -f/u by CBC  Chest pain:  Troponin is slightly elevated at 29 --> 24.   CT angiogram is negative for PE.   Most likely due to demand ischemia secondary to CHF exacerbation. -on aspirin, Lipitor, Coreg    DVT prophylaxis: Lovenox Code Status: Full Family Communication: None Disposition Plan: Anticipate home tomorrow depending on clinical condition  Consultants:   None  Procedures:   2D echocardiogram  Antimicrobials:   None   Subjective: Seen and examined Diuresing, net -2.5 L since admission Echocardiogram, EF less than 20% Cardiology following  Objective: Vitals:   04/15/19 1557 04/15/19 1757 04/15/19 2009 04/15/19 2048  BP: 116/71 131/74 121/70   Pulse: 88 90 88   Resp: 16  20   Temp: 98.1 F (36.7 C)  98  F (36.7 C)   TempSrc: Oral  Oral   SpO2: 97%  96% 97%  Weight:      Height:        Intake/Output Summary (Last 24 hours) at 04/15/2019 2106 Last data filed at 04/15/2019 1900 Gross per 24 hour  Intake 720 ml  Output 1400 ml  Net -680 ml   Filed Weights    04/13/19 0440 04/14/19 0512 04/15/19 0518  Weight: 85.6 kg 85 kg 85.4 kg    Examination:  General exam: Appears calm and comfortable  Respiratory system: Wheezing to auscultation. Respiratory effort normal. Cardiovascular system: S1-S2, RRR, positive JVD, trace pedal edema  gastrointestinal system: Abdomen is nondistended, soft and nontender. No organomegaly or masses felt. Normal bowel sounds heard. Central nervous system: Alert and oriented. No focal neurological deficits. Extremities: Symmetric 5 x 5 power. Skin: No rashes, lesions or ulcers Psychiatry: Judgement and insight appear normal. Mood & affect appropriate.     Data Reviewed: I have personally reviewed following labs and imaging studies  CBC: Recent Labs  Lab 04/10/19 1622 04/15/19 0513  WBC 12.0* 10.3  HGB 10.4* 11.7*  HCT 34.3* 37.6  MCV 82.3 81.2  PLT 438* 956*   Basic Metabolic Panel: Recent Labs  Lab 04/10/19 1622 04/12/19 0608 04/13/19 0543 04/14/19 0653 04/15/19 0513  NA 137 137 137 138 134*  K 3.9 3.3* 3.9 4.1 4.4  CL 105 98 99 98 99  CO2 21* 27 27 28 24   GLUCOSE 126* 104* 122* 102* 161*  BUN 31* 21 27* 30* 37*  CREATININE 1.09* 0.96 1.12* 1.13* 1.10*  CALCIUM 9.2 9.0 9.2 9.7 9.9  MG  --  1.9 2.1 2.2 2.2   GFR: Estimated Creatinine Clearance: 59.1 mL/min (A) (by C-G formula based on SCr of 1.1 mg/dL (H)). Sepsis Labs: No results for input(s): PROCALCITON, LATICACIDVEN in the last 168 hours.  Recent Results (from the past 240 hour(s))  Respiratory Panel by RT PCR (Flu A&B, Covid) - Nasopharyngeal Swab     Status: None   Collection Time: 04/11/19  3:12 AM   Specimen: Nasopharyngeal Swab  Result Value Ref Range Status   SARS Coronavirus 2 by RT PCR NEGATIVE NEGATIVE Final    Comment: (NOTE) SARS-CoV-2 target nucleic acids are NOT DETECTED. The SARS-CoV-2 RNA is generally detectable in upper respiratoy specimens during the acute phase of infection. The lowest concentration of SARS-CoV-2  viral copies this assay can detect is 131 copies/mL. A negative result does not preclude SARS-Cov-2 infection and should not be used as the sole basis for treatment or other patient management decisions. A negative result may occur with  improper specimen collection/handling, submission of specimen other than nasopharyngeal swab, presence of viral mutation(s) within the areas targeted by this assay, and inadequate number of viral copies (<131 copies/mL). A negative result must be combined with clinical observations, patient history, and epidemiological information. The expected result is Negative. Fact Sheet for Patients:  PinkCheek.be Fact Sheet for Healthcare Providers:  GravelBags.it This test is not yet ap proved or cleared by the [place] FDA and  has been authorized for detection and/or diagnosis of SARS-CoV-2 by FDA under an Emergency Use Authorization (EUA). This EUA will remain  in effect (meaning this test can be used) for the duration of the COVID-19 declaration under Section 564(b)(1) of the Act, 21 U.S.C. section 360bbb-3(b)(1), unless the authorization is terminated or revoked sooner.    Influenza A by PCR NEGATIVE NEGATIVE Final   Influenza B by PCR  NEGATIVE NEGATIVE Final    Comment: (NOTE) The Xpert Xpress SARS-CoV-2/FLU/RSV assay is intended as an aid in  the diagnosis of influenza from Nasopharyngeal swab specimens and  should not be used as a sole basis for treatment. Nasal washings and  aspirates are unacceptable for Xpert Xpress SARS-CoV-2/FLU/RSV  testing. Fact Sheet for Patients: https://www.moore.com/ Fact Sheet for Healthcare Providers: https://www.young.biz/ This test is not yet approved or cleared by the [place] FDA and  has been authorized for detection and/or diagnosis of SARS-CoV-2 by  FDA under an Emergency Use Authorization (EUA). This EUA will  remain  in effect (meaning this test can be used) for the duration of the  Covid-19 declaration under Section 564(b)(1) of the Act, 21  U.S.C. section 360bbb-3(b)(1), unless the authorization is  terminated or revoked. Performed at [facility], [address], [place], [place] [address]          Radiology Studies: No results found.      Scheduled Meds: . aspirin EC  81 mg Oral Daily  . atorvastatin  80 mg Oral Daily  . benzonatate  200 mg Oral BID  . carvedilol  12.5 mg Oral BID WC  . dextromethorphan-guaiFENesin  1 tablet Oral BID  . digoxin  0.125 mg Oral Daily  . enoxaparin (LOVENOX) injection  40 mg Subcutaneous Q24H  . furosemide  40 mg Intravenous Q12H  . ipratropium  0.5 mg Inhalation Q6H  . methylPREDNISolone (SOLU-MEDROL) injection  60 mg Intravenous Q6H  . mometasone-formoterol  2 puff Inhalation BID  . pantoprazole  40 mg Oral Daily  . sacubitril-valsartan  2 tablet Oral Daily  . sodium chloride flush  3 mL Intravenous Q12H  . spironolactone  25 mg Oral Daily  . vitamin B-12  500 mcg Oral Daily   Continuous Infusions: . sodium chloride       LOS: 4 days    Time spent: 35 minutes    Delfino Lovett, MD [facility] Pager [phone]  If 7PM-7AM, please contact night-coverage [email or web address] Password [email or web address] 04/15/2019, 9:06 PM

## 2019-04-15 NOTE — Plan of Care (Signed)
  Problem: Education: Goal: Ability to demonstrate management of disease process will improve Outcome: Progressing   Problem: Activity: Goal: Capacity to carry out activities will improve Outcome: Progressing   Problem: Cardiac: Goal: Ability to achieve and maintain adequate cardiopulmonary perfusion will improve Outcome: Progressing   Problem: Clinical Measurements: Goal: Ability to maintain clinical measurements within normal limits will improve Outcome: Progressing Goal: Will remain free from infection Outcome: Progressing Goal: Diagnostic test results will improve Outcome: Progressing Goal: Respiratory complications will improve Outcome: Progressing Goal: Cardiovascular complication will be avoided Outcome: Progressing   Problem: Skin Integrity: Goal: Risk for impaired skin integrity will decrease Outcome: Progressing

## 2019-04-16 ENCOUNTER — Other Ambulatory Visit: Payer: Self-pay | Admitting: *Deleted

## 2019-04-16 DIAGNOSIS — R0602 Shortness of breath: Secondary | ICD-10-CM

## 2019-04-16 DIAGNOSIS — I5022 Chronic systolic (congestive) heart failure: Secondary | ICD-10-CM

## 2019-04-16 LAB — BASIC METABOLIC PANEL
Anion gap: 13 (ref 5–15)
BUN: 47 mg/dL — ABNORMAL HIGH (ref 8–23)
CO2: 25 mmol/L (ref 22–32)
Calcium: 9.6 mg/dL (ref 8.9–10.3)
Chloride: 96 mmol/L — ABNORMAL LOW (ref 98–111)
Creatinine, Ser: 1.22 mg/dL — ABNORMAL HIGH (ref 0.44–1.00)
GFR calc Af Amer: 53 mL/min — ABNORMAL LOW (ref >60)
GFR calc non Af Amer: 46 mL/min — ABNORMAL LOW (ref >60)
Glucose, Bld: 235 mg/dL — ABNORMAL HIGH (ref 70–99)
Potassium: 4 mmol/L (ref 3.5–5.1)
Sodium: 134 mmol/L — ABNORMAL LOW (ref 135–145)

## 2019-04-16 LAB — MAGNESIUM: Magnesium: 2.4 mg/dL (ref 1.7–2.4)

## 2019-04-16 MED ORDER — DIGOXIN 125 MCG PO TABS: 0.1250 mg | ORAL_TABLET | Freq: Every day | ORAL | 0 refills | Status: DC

## 2019-04-16 MED ORDER — BENZONATATE 200 MG PO CAPS: 200.0000 mg | ORAL_CAPSULE | Freq: Two times a day (BID) | ORAL | 0 refills | Status: AC

## 2019-04-16 MED ORDER — PREDNISONE 10 MG (21) PO TBPK: ORAL_TABLET | ORAL | 0 refills | Status: AC

## 2019-04-16 MED ORDER — DIGOXIN 125 MCG PO TABS: 0.1250 mg | ORAL_TABLET | Freq: Every day | ORAL | 0 refills | Status: AC

## 2019-04-16 MED ORDER — ENTRESTO 24-26 MG PO TABS: 2.0000 | ORAL_TABLET | Freq: Every day | ORAL | 0 refills | Status: DC

## 2019-04-16 MED ORDER — ENTRESTO 24-26 MG PO TABS: 2.0000 | ORAL_TABLET | Freq: Every day | ORAL | 0 refills | Status: AC

## 2019-04-16 NOTE — Plan of Care (Signed)
  Problem: Activity: Goal: Capacity to carry out activities will improve Outcome: Progressing   Problem: Cardiac: Goal: Ability to achieve and maintain adequate cardiopulmonary perfusion will improve Outcome: Progressing   Problem: Clinical Measurements: Goal: Ability to maintain clinical measurements within normal limits will improve Outcome: Progressing Goal: Will remain free from infection Outcome: Progressing Goal: Diagnostic test results will improve Outcome: Progressing Goal: Respiratory complications will improve Outcome: Progressing Goal: Cardiovascular complication will be avoided Outcome: Progressing   

## 2019-04-16 NOTE — TOC Transition Note (Signed)
Transition of Care [facility]) - CM/SW Discharge Note   Patient Details  Name: Barbara Oliver MRN: [id number] Date of Birth: 1950-05-29  [facility] CM/SW Contact:  Shawn Route, RN Phone Number: 04/16/2019, 10:11 AM   Clinical Narrative:     Patient discharging home today with family.  Referral to [facility] placed with Corky Crafts.    Final next level of care: Home/Self Care Barriers to Discharge: Barriers Resolved   Patient Goals and CMS Choice     Choice offered to / list presented to : Patient  Discharge Placement                       Discharge Plan and Services   Discharge Planning Services: Other - See comment([facility])                                 Social Determinants of Health (SDOH) Interventions     Readmission Risk Interventions No flowsheet data found.

## 2019-04-16 NOTE — Care Management Important Message (Signed)
Important Message  Patient Details  Name: Barbara Oliver MRN: [id number] Date of Birth: 07-13-1950   Medicare Important Message Given:  Yes     Johnell Comings 04/16/2019, 11:07 AM

## 2019-04-16 NOTE — Progress Notes (Signed)
[facility] Card[facility]    SUBJECTIVE: states to feel miss of breath no significant chest pain ambulating well no leg swelling or edema denies fever chills or sweats.  Patient feels so improved that she may feel well enough to help go home soon   Vitals:   04/16/19 0421 04/16/19 0422 04/16/19 0745 04/16/19 0847  BP: 110/68  118/76   Pulse: 83  79 83  Resp: 20   16  Temp: 98.3 F (36.8 C)  98 F (36.7 C)   TempSrc: Oral  Oral   SpO2: 95%  98% 96%  Weight:  86 kg    Height:         Intake/Output Summary (Last 24 hours) at 04/16/2019 1031 Last data filed at 04/16/2019 0930 Gross per 24 hour  Intake 483 ml  Output 900 ml  Net -417 ml      PHYSICAL EXAM  General: Well developed, well nourished, in no acute distress HEENT:  Normocephalic and atramatic Neck:  No JVD.  Lungs: Clear bilaterally to auscultation and percussion. Heart: HRRR . Normal S1 and S2 withS3 gallops or murmurs.  Abdomen: Bowel sounds are positive, abdomen soft and non-tender  Msk:  Back normal, normal gait. Normal strength and tone for age. Extremities: No clubbing, cyanosis or edema.   Neuro: Alert and oriented X 3. Psych:  Good affect, responds appropriately   LABS: Basic Metabolic Panel: Recent Labs    04/15/19 0513 04/16/19 0629  NA 134* 134*  K 4.4 4.0  CL 99 96*  CO2 24 25  GLUCOSE 161* 235*  BUN 37* 47*  CREATININE 1.10* 1.22*  CALCIUM 9.9 9.6  MG 2.2 2.4   Liver Function Tests: No results for input(s): AST, ALT, ALKPHOS, BILITOT, PROT, ALBUMIN in the last 72 hours. No results for input(s): LIPASE, AMYLASE in the last 72 hours. CBC: Recent Labs    04/15/19 0513  WBC 10.3  HGB 11.7*  HCT 37.6  MCV 81.2  PLT 454*   Cardiac Enzymes: No results for input(s): CKTOTAL, CKMB, CKMBINDEX, TROPONINI in the last 72 hours. BNP: Invalid input(s): POCBNP D-Dimer: No results for input(s): DDIMER in the last 72 hours. Hemoglobin A1C: No results for input(s): HGBA1C in the last 72 hours. Fasting  Lipid Panel: No results for input(s): CHOL, HDL, LDLCALC, TRIG, CHOLHDL, LDLDIRECT in the last 72 hours. Thyroid Function Tests: No results for input(s): TSH, T4TOTAL, T3FREE, THYROIDAB in the last 72 hours.  Invalid input(s): FREET3 Anemia Panel: No results for input(s): VITAMINB12, FOLATE, FERRITIN, TIBC, IRON, RETICCTPCT in the last 72 hours.  No results found.   Echo  Severely depressed left ventricular function global ejection fraction 25%  TELEMETRY:  Normal sinus rhythm nonspecific ST-T changes  ASSESSMENT AND PLAN:  Principal Problem:   Acute on chronic diastolic CHF (congestive heart failure) (HCC) Active Problems:   CKD (chronic kidney disease), stage III   COPD exacerbation (HCC)   Acid reflux   HTN (hypertension)   HLD (hyperlipidemia)   Leukocytosis   Chest pain   Shortness of breath  leg edema .  plan  agree with advancing heart failure therapy  continue telemetry  inhalers as necessary for Chronic obstructive pulmonary disease type symptoms  chest pain appears to being nonischemic in nature  increase activity  continue hypertension management and control  consider follow-up with Nephrology for renal insufficiency  for follow-up with cardiology for possible AICD as an outpatient     Alwyn Pea, MD, [facility] 04/16/2019 10:31 AM

## 2019-04-16 NOTE — Progress Notes (Signed)
New referral for [employer] community Palliative program to follow at home received from [facility]. Patient information given to referral. Thank you for this referral. Dayna Barker [facility], [facility] Liaison [facility]  [phone]

## 2019-04-18 NOTE — Discharge Summary (Addendum)
[place] at [facility]   PATIENT NAME: Barbara Oliver    MR#:  [id number]  DATE OF BIRTH:  1951-03-05  DATE OF ADMISSION:  04/10/2019   ADMITTING PHYSICIAN: Lorretta Harp, MD  DATE OF DISCHARGE: 04/16/2019 12:03 PM  PRIMARY CARE PHYSICIAN: Courtney Paris, NP   ADMISSION DIAGNOSIS:  Shortness of breath [R06.02] Acute on chronic diastolic congestive heart failure (HCC) [I50.33] Acute on chronic diastolic CHF (congestive heart failure) (HCC) [I50.33] DISCHARGE DIAGNOSIS:  Principal Problem:   Acute on chronic diastolic CHF (congestive heart failure) (HCC) Active Problems:   CKD (chronic kidney disease), stage III   COPD exacerbation (HCC)   Acid reflux   HTN (hypertension)   HLD (hyperlipidemia)   Leukocytosis   Chest pain  SECONDARY DIAGNOSIS:   Past Medical History:  Diagnosis Date  . Abdominal pain, epigastric 10/06/2015  . Acid reflux   . Acute on chronic systolic (congestive) heart failure (HCC) 03/19/2017  . Acute renal insufficiency 10/06/2015  . Acute respiratory failure with hypoxia (HCC) 04/26/2018  . Chronic systolic CHF (congestive heart failure) (HCC)   . CKD (chronic kidney disease), stage III   . 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 COPD with acute bronchitis (HCC) 04/25/2018  . Dyspnea   . Elevated transaminase level 10/06/2015  . History of noncompliance with medical treatment, presenting hazards to health   . Hyperglycemia 10/06/2015  . LBBB (left bundle branch block)   . Lung nodule    a. seen on prior CT, f/u due 11/2017.  . Mitral regurgitation   . NICM (nonischemic cardiomyopathy) (HCC)    a. prior hx of this, EF 20-25% in 11/2016. b. EF 30-35% by cath 03/2017 with normal coronaries.  . Protein-calorie malnutrition, severe 03/07/2016  . Respiratory distress 04/25/2018  . Severe mitral regurgitation 10/06/2015  . Tobacco abuse    HOSPITAL COURSE:  Barbara Oliver a 69 y.o.femalewith medical history significant ofCOPD,  hyperlipidemia, GERD, former smoker, mitral valve regurgitation, left bundle blockage,dCHF, CKD stage IIIa, admitted for shortness breath and chest pain. Patient reported having dry coughandshortness of breath for more than 1 month, which had worsened in the past 10 days. She also reported chest pain, which is located in the substernal area, moderate, pleuritic, worsening on deep breath. Her Lasix dosewas increased from 40 mg daily to twice a day by her doctor without significant help.   Acute on chronic systolic and diastolic CHF: Patient did not have leg edema physical examination, but had positive JVD, elevated BNP 1319, CT angiogram is negative for PE, showed mild interstitial pulmonary edema and pulmonary hypertension, consistent with CHF exacerbation. -Echocardiogram demonstrates ejection fraction of less than 20% -Diuresed with Lasix -Cardio not planning ischemic eval at this time  CKD (chronic kidney disease), stage IIIa:  Stable and at baseline  COPD exacerbation (HCC): Treated with steroids, nebs, inhalers  Acid reflux: -PPI  HTN:  -Continue home medications:Coreg, Entresto -hydralazine prn  HLD (hyperlipidemia) -Lipitor  Leukocytosis:WBC 12.0, no fever, likely reactive -f/u by CBC  Chest pain: Troponins neg CT angiogram is negative for PE.  Most likely due to demand ischemia secondary to CHF exacerbation. -onaspirin, Lipitor, Coreg DISCHARGE CONDITIONS:  stable CONSULTS OBTAINED:   DRUG ALLERGIES:  No Known Allergies DISCHARGE MEDICATIONS:   Allergies as of 04/16/2019   No Known Allergies     Medication List    TAKE these medications   acetaminophen 500 MG tablet Commonly known as: TYLENOL Take 1,000 mg by mouth daily as needed for mild  pain.   albuterol 108 (90 Base) MCG/ACT inhaler Commonly known as: VENTOLIN HFA Inhale 1-2 puffs into the lungs every 6 (six) hours as needed for wheezing or shortness of breath.   albuterol (2.5  MG/3ML) 0.083% nebulizer solution Commonly known as: PROVENTIL Take 2.5 mg by nebulization every 6 (six) hours as needed for wheezing or shortness of breath.   Anoro Ellipta 62.5-25 MCG/INH Aepb Generic drug: umeclidinium-vilanterol Inhale 1 puff into the lungs daily as needed (SOB).   aspirin 81 MG EC tablet Take 1 tablet (81 mg total) by mouth daily.   atorvastatin 80 MG tablet Commonly known as: LIPITOR Take 1 tablet (80 mg total) by mouth daily.   benzonatate 200 MG capsule Commonly known as: TESSALON Take 1 capsule (200 mg total) by mouth 2 (two) times daily. What changed:   medication strength  how much to take  when to take this  reasons to take this   budesonide-formoterol 160-4.5 MCG/ACT inhaler Commonly known as: SYMBICORT Inhale 2 puffs into the lungs 2 (two) times daily as needed (SOB).   budesonide-formoterol 160-4.5 MCG/ACT inhaler Commonly known as: Symbicort Inhale 2 puffs into the lungs 2 (two) times daily.   carvedilol 12.5 MG tablet Commonly known as: COREG Take 12.5 mg by mouth 2 (two) times daily with a meal.   digoxin 0.125 MG tablet Commonly known as: LANOXIN Take 1 tablet (0.125 mg total) by mouth daily.   Entresto 24-26 MG Generic drug: sacubitril-valsartan Take 2 tablets by mouth daily. What changed: how much to take   fluticasone 50 MCG/ACT nasal spray Commonly known as: FLONASE Place 1 spray into both nostrils daily. What changed:   when to take this  reasons to take this   furosemide 40 MG tablet Commonly known as: LASIX TAKE 1 TABLET BY MOUTH DAILY. What changed: when to take this   pantoprazole 40 MG tablet Commonly known as: PROTONIX Take 1 tablet (40 mg total) by mouth daily.   predniSONE 10 MG (21) Tbpk tablet Commonly known as: STERAPRED UNI-PAK 21 TAB Start 60 mg po daily, taper 10 mg daily until done   spironolactone 25 MG tablet Commonly known as: ALDACTONE TAKE 1 TABLET BY MOUTH DAILY.   vitamin B-12 500  MCG tablet Commonly known as: CYANOCOBALAMIN Take 1 tablet (500 mcg total) by mouth daily.   Vitamin D (Ergocalciferol) 1.25 MG (50000 UNIT) Caps capsule Commonly known as: DRISDOL Take 50,000 Units by mouth every Saturday.      DISCHARGE INSTRUCTIONS:   DIET:  Cardiac diet DISCHARGE CONDITION:  Stable ACTIVITY:  Activity as tolerated OXYGEN:  Home Oxygen: No.  Oxygen Delivery: room air DISCHARGE LOCATION:  home - palliative care to follow  If you experience worsening of your admission symptoms, develop shortness of breath, life threatening emergency, suicidal or homicidal thoughts you must seek medical attention immediately by calling 911 or calling your MD immediately  if symptoms less severe.  You Must read complete instructions/literature along with all the possible adverse reactions/side effects for all the Medicines you take and that have been prescribed to you. Take any new Medicines after you have completely understood and accpet all the possible adverse reactions/side effects.   Please note  You were cared for by a hospitalist during your hospital stay. If you have any questions about your discharge medications or the care you received while you were in the hospital after you are discharged, you can call the unit and asked to speak with the hospitalist on call if  the hospitalist that took care of you is not available. Once you are discharged, your primary care physician will handle any further medical issues. Please note that NO REFILLS for any discharge medications will be authorized once you are discharged, as it is imperative that you return to your primary care physician (or establish a relationship with a primary care physician if you do not have one) for your aftercare needs so that they can reassess your need for medications and monitor your lab values.    On the day of Discharge:  VITAL SIGNS:  Blood pressure 118/76, pulse 83, temperature 98 F (36.7 C),  temperature source Oral, resp. rate 16, height 6\' 1"  (1.854 m), weight 86 kg, SpO2 96 %. PHYSICAL EXAMINATION:  GENERAL:  69 y.o.-year-old patient lying in the bed with no acute distress.  EYES: Pupils equal, round, reactive to light and accommodation. No scleral icterus. Extraocular muscles intact.  HEENT: Head atraumatic, normocephalic. Oropharynx and nasopharynx clear.  NECK:  Supple, no jugular venous distention. No thyroid enlargement, no tenderness.  LUNGS: Normal breath sounds bilaterally, no wheezing, rales,rhonchi or crepitation. No use of accessory muscles of respiration.  CARDIOVASCULAR: S1, S2 normal. No murmurs, rubs, or gallops.  ABDOMEN: Soft, non-tender, non-distended. Bowel sounds present. No organomegaly or mass.  EXTREMITIES: No pedal edema, cyanosis, or clubbing.  NEUROLOGIC: Cranial nerves II through XII are intact. Muscle strength 5/5 in all extremities. Sensation intact. Gait not checked.  PSYCHIATRIC: The patient is alert and oriented x 3.  SKIN: No obvious rash, lesion, or ulcer.  DATA REVIEW:   CBC Recent Labs  Lab 04/15/19 0513  WBC 10.3  HGB 11.7*  HCT 37.6  PLT 454*    Chemistries  Recent Labs  Lab 04/16/19 0629  NA 134*  K 4.0  CL 96*  CO2 25  GLUCOSE 235*  BUN 47*  CREATININE 1.22*  CALCIUM 9.6  MG 2.4      Follow-up Information    [place] Follow up on 04/22/2019.   Specialty: Cardiology Why: at 8:30 am. Enter through the [facility] entrance Contact information: [place] [phone]       [facility] Cardiac and Pulmonary Rehab. Go on 04/17/2019.   Specialty: Cardiac Rehabilitation Why: [facility] Cardiac Rehab department will call you in a couple of weeks to schedule your appointment..................... Contact information: [place] [id number] [address] [place] [phone]           Management  plans discussed with the patient, family and they are in agreement.  CODE STATUS: Prior   TOTAL TIME TAKING CARE OF THIS PATIENT: 45 minutes.    Max Sane M.D on 04/18/2019 at 10:12 AM  [facility]   CC: Primary care physician; Simona Huh, NP   Note: This dictation was prepared with Dragon dictation along with smaller phrase technology. Any transcriptional errors that result from this process are unintentional.

## 2019-04-19 ENCOUNTER — Ambulatory Visit: Payer: Medicare HMO | Admitting: Family

## 2019-04-21 NOTE — Progress Notes (Deleted)
   Patient ID: Barbara Oliver, female    DOB: [birth year], 69 y.o.   MRN: [id number]  HPI  Ms Barbara Oliver is a 69 y/o female with a history of  Echo report from 04/12/19 reviewed and showed an EF of <20% along with mild/moderate MR and mild TR.   Catheterization done 03/21/17 which showed:  Ao = 125/72 (93) LV = 116/5 RA = 2 RV = 32/1 PA = 30/9 (18) PCW = 10 Fick cardiac output/index = 4.6/2.5 PVR = 1.7 WU SVR = 1536 FA sat =  90% PA sat = 57%, 58%  Assessment: 1. Normal coronary arteries 2. NICM EF 30-35% 3. Well-compensated filling pressures after diuresis  Admitted 04/10/19 due to acute on chronic HF. Cardiology consult obtained. Diuretic dose changed. CT angiogram was negative for PE. Troponin thought to be due to demand ischemia. Discharged after 6 days. Was in the ED 03/31/19 due to cough. IV lasix given and she was released. Was in the ED 03/29/19 due to chest pain and shortness of breath where she was evaluated and released.   She presents today for her initial visit with a chief complaint of   Review of Systems    Physical Exam    Assessment & Plan:  1: Chronic heart failure with reduced ejection fraction- - NYHA class - had telemedicine visit with cardiology Barbara Oliver) 12/22/2018 - BNP 04/10/19 was 1319.0  2: HTN- - BP - BMP 04/16/19 reviewed and showed sodium 134, potassium 4.0, creatinine 1.22 and GFR 53  3: COPD-

## 2019-04-22 ENCOUNTER — Ambulatory Visit: Payer: Medicare HMO | Admitting: Family

## 2019-04-22 ENCOUNTER — Telehealth: Payer: Self-pay | Admitting: Family

## 2019-04-22 NOTE — Telephone Encounter (Signed)
Patient did not show for her Heart Failure Clinic appointment on 04/22/19. Will attempt to reschedule.  

## 2019-04-27 ENCOUNTER — Telehealth: Payer: Self-pay | Admitting: Family

## 2019-04-30 ENCOUNTER — Telehealth: Payer: Self-pay | Admitting: Internal Medicine

## 2019-05-03 ENCOUNTER — Telehealth: Payer: Self-pay

## 2019-05-03 DIAGNOSIS — R0602 Shortness of breath: Secondary | ICD-10-CM

## 2019-05-03 NOTE — Telephone Encounter (Signed)
Note entered for 04/30/19: Phone call placed to patient by Angelique Blonder to schedule a visit. No answer.

## 2019-05-03 NOTE — Telephone Encounter (Signed)
Phone call placed to patient to introduce Palliative care and offer to schedule a visit with NP.  This RN was informed that patient is staying in a group home in [place] ran by [employer] and [employer].

## 2019-05-25 ENCOUNTER — Emergency Department: Payer: Medicare HMO

## 2019-05-25 ENCOUNTER — Other Ambulatory Visit: Payer: Self-pay

## 2019-05-25 ENCOUNTER — Emergency Department
Admission: EM | Admit: 2019-05-25 | Discharge: 2019-05-25 | Disposition: A | Discharge status: Home / Self Care | Payer: Medicare HMO | Attending: Emergency Medicine | Admitting: Emergency Medicine

## 2019-05-25 DIAGNOSIS — Z79899 Other long term (current) drug therapy: Secondary | ICD-10-CM | POA: Insufficient documentation

## 2019-05-25 DIAGNOSIS — J449 Chronic obstructive pulmonary disease, unspecified: Secondary | ICD-10-CM | POA: Insufficient documentation

## 2019-05-25 DIAGNOSIS — Z20822 Contact with and (suspected) exposure to covid-19: Secondary | ICD-10-CM | POA: Insufficient documentation

## 2019-05-25 DIAGNOSIS — N183 Chronic kidney disease, stage 3 unspecified: Secondary | ICD-10-CM | POA: Insufficient documentation

## 2019-05-25 DIAGNOSIS — I130 Hypertensive heart and chronic kidney disease with heart failure and stage 1 through stage 4 chronic kidney disease, or unspecified chronic kidney disease: Secondary | ICD-10-CM | POA: Diagnosis not present

## 2019-05-25 DIAGNOSIS — Z720 Tobacco use: Secondary | ICD-10-CM | POA: Diagnosis not present

## 2019-05-25 DIAGNOSIS — R0602 Shortness of breath: Secondary | ICD-10-CM | POA: Diagnosis present

## 2019-05-25 DIAGNOSIS — Z7982 Long term (current) use of aspirin: Secondary | ICD-10-CM | POA: Diagnosis not present

## 2019-05-25 DIAGNOSIS — I509 Heart failure, unspecified: Secondary | ICD-10-CM

## 2019-05-25 LAB — CBC WITH DIFFERENTIAL/PLATELET
Abs Immature Granulocytes: 0.03 10*3/uL (ref 0.00–0.07)
Basophils Absolute: 0.1 10*3/uL (ref 0.0–0.1)
Basophils Relative: 1 %
Eosinophils Absolute: 0.2 10*3/uL (ref 0.0–0.5)
Eosinophils Relative: 2 %
HCT: 33.7 % — ABNORMAL LOW (ref 36.0–46.0)
Hemoglobin: 9.9 g/dL — ABNORMAL LOW (ref 12.0–15.0)
Immature Granulocytes: 0 %
Lymphocytes Relative: 25 %
Lymphs Abs: 2 10*3/uL (ref 0.7–4.0)
MCH: 25.1 pg — ABNORMAL LOW (ref 26.0–34.0)
MCHC: 29.4 g/dL — ABNORMAL LOW (ref 30.0–36.0)
MCV: 85.3 fL (ref 80.0–100.0)
Monocytes Absolute: 0.6 10*3/uL (ref 0.1–1.0)
Monocytes Relative: 8 %
Neutro Abs: 5.2 10*3/uL (ref 1.7–7.7)
Neutrophils Relative %: 64 %
Platelets: 448 10*3/uL — ABNORMAL HIGH (ref 150–400)
RBC: 3.95 MIL/uL (ref 3.87–5.11)
RDW: 18.5 % — ABNORMAL HIGH (ref 11.5–15.5)
WBC: 8 10*3/uL (ref 4.0–10.5)
nRBC: 0.3 % — ABNORMAL HIGH (ref 0.0–0.2)

## 2019-05-25 LAB — BASIC METABOLIC PANEL
Anion gap: 8 (ref 5–15)
BUN: 22 mg/dL (ref 8–23)
CO2: 25 mmol/L (ref 22–32)
Calcium: 8.7 mg/dL — ABNORMAL LOW (ref 8.9–10.3)
Chloride: 108 mmol/L (ref 98–111)
Creatinine, Ser: 1.19 mg/dL — ABNORMAL HIGH (ref 0.44–1.00)
GFR calc Af Amer: 55 mL/min — ABNORMAL LOW (ref >60)
GFR calc non Af Amer: 47 mL/min — ABNORMAL LOW (ref >60)
Glucose, Bld: 167 mg/dL — ABNORMAL HIGH (ref 70–99)
Potassium: 3.9 mmol/L (ref 3.5–5.1)
Sodium: 141 mmol/L (ref 135–145)

## 2019-05-25 LAB — RESPIRATORY PANEL BY RT PCR (FLU A&B, COVID)
Influenza A by PCR: NEGATIVE
Influenza B by PCR: NEGATIVE
SARS Coronavirus 2 by RT PCR: NEGATIVE

## 2019-05-25 LAB — TROPONIN I (HIGH SENSITIVITY)
Troponin I (High Sensitivity): 22 ng/L — ABNORMAL HIGH (ref <18)
Troponin I (High Sensitivity): 20 ng/L — ABNORMAL HIGH (ref <18)

## 2019-05-25 LAB — BRAIN NATRIURETIC PEPTIDE: B Natriuretic Peptide: 1727 pg/mL — ABNORMAL HIGH (ref 0.0–100.0)

## 2019-05-25 MED ORDER — FUROSEMIDE 10 MG/ML IJ SOLN
60.0000 mg | Freq: Once | INTRAMUSCULAR | Status: AC
  Administered 2019-05-25: 60 mg via INTRAVENOUS
  Filled 2019-05-25: qty 8

## 2019-05-25 MED ORDER — HYDROCOD POLST-CPM POLST ER 10-8 MG/5ML PO SUER: 5.0000 mL | Freq: Every evening | ORAL | 0 refills | Status: AC | PRN

## 2019-05-25 MED ORDER — HYDROCOD POLST-CPM POLST ER 10-8 MG/5ML PO SUER
5.0000 mL | Freq: Once | ORAL | Status: AC
  Administered 2019-05-25: 5 mL via ORAL
  Filled 2019-05-25: qty 5

## 2019-05-25 NOTE — Discharge Instructions (Addendum)
Continue to take your Lasix as prescribed.  Follow-up in the heart failure clinic within 1 day for reevaluation.  Return to the emergency room for new or worsening chest pain or fever.  Take 2 snacks at bedtime to help you sleep by stopping your cough.

## 2019-05-25 NOTE — ED Notes (Signed)
This RN assisted pt with ambulation to bathroom, pt ambulated independently with steady gait.

## 2019-05-25 NOTE — ED Notes (Signed)
Pt given cup of ice with permission from MD Don Perking.

## 2019-05-25 NOTE — ED Triage Notes (Signed)
Pt to ED via EMS from home. Pt states she has had non productive cough and shortness of breath xfew months. Pt also c/o chest pain, chest pain is reproducible with palpation. Pt has hx of chf, states her EF is 20%.

## 2019-05-25 NOTE — ED Notes (Signed)
Pt given warm blankets and reassured that the doctor will be in to see her

## 2019-05-25 NOTE — ED Notes (Signed)
MD Veronese at bedside  

## 2019-05-25 NOTE — ED Notes (Signed)
When this RN entered room, pt resting comfortably. This RN woke pt up to discuss D/C instructions. Pt still c/o cough at this time. Pt insrtucted a prescription for cough medicin sent with pt. MD Williams aware. E-signature not working at this time. Pt verbalized understanding of D/C instructions, prescriptions and follow up care with no further questions at this time. Pt in NAD and wheeled to lobby at time of D/C. Pt given information for financial assistance for prescriptions.
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
## 2019-05-25 NOTE — ED Provider Notes (Signed)
Patient states she feels well enough to go home.   Emily Filbert, MD 05/25/19 [phone]

## 2019-05-27 ENCOUNTER — Telehealth: Payer: Self-pay | Admitting: Family

## 2019-05-27 ENCOUNTER — Ambulatory Visit: Payer: Medicare HMO | Admitting: Family

## 2019-05-27 NOTE — Telephone Encounter (Signed)
Patient did not show for her [facility] appointment on 05/27/19. Will attempt to reschedule.

## 2019-06-06 ENCOUNTER — Emergency Department
Admission: EM | Admit: 2019-06-06 | Discharge: 2019-06-10 | Disposition: E | Payer: Medicare HMO | Attending: Emergency Medicine | Admitting: Emergency Medicine

## 2019-06-06 DIAGNOSIS — N183 Chronic kidney disease, stage 3 unspecified: Secondary | ICD-10-CM | POA: Insufficient documentation

## 2019-06-06 DIAGNOSIS — I130 Hypertensive heart and chronic kidney disease with heart failure and stage 1 through stage 4 chronic kidney disease, or unspecified chronic kidney disease: Secondary | ICD-10-CM | POA: Diagnosis not present

## 2019-06-06 DIAGNOSIS — J449 Chronic obstructive pulmonary disease, unspecified: Secondary | ICD-10-CM | POA: Insufficient documentation

## 2019-06-06 DIAGNOSIS — I5022 Chronic systolic (congestive) heart failure: Secondary | ICD-10-CM | POA: Diagnosis not present

## 2019-06-06 DIAGNOSIS — Z79899 Other long term (current) drug therapy: Secondary | ICD-10-CM | POA: Insufficient documentation

## 2019-06-06 DIAGNOSIS — F17210 Nicotine dependence, cigarettes, uncomplicated: Secondary | ICD-10-CM | POA: Insufficient documentation

## 2019-06-06 DIAGNOSIS — I469 Cardiac arrest, cause unspecified: Secondary | ICD-10-CM | POA: Diagnosis not present

## 2019-06-07 LAB — GLUCOSE, CAPILLARY: Glucose-Capillary: 139 mg/dL — ABNORMAL HIGH (ref 70–99)

## 2019-06-10 NOTE — Code Documentation (Signed)
Pulse check at this time.  Pt remains in asystole. No pulse.

## 2019-06-10 NOTE — ED Notes (Signed)
Pt body labels and placed in body bag. Two rings noted on left hand that cannot be removed, and an IO noted in LLE. Belongings in valuables bag and with pt in the room at this time.
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
## 2019-06-10 NOTE — Code Documentation (Signed)
Pulse check at this time, pt remains in asystole with no pulse. cpr continued

## 2019-06-10 DEATH — deceased

## 2022-01-16 NOTE — Telephone Encounter (Signed)
Error
